# Patient Record
Sex: Female | Born: 1960 | Race: White | Hispanic: No | Smoking: Current every day smoker
Health system: Southern US, Community
[De-identification: ages and names within clinical notes are randomized; demographics above are authoritative.]

## PROBLEM LIST (undated history)

## (undated) DIAGNOSIS — Z91410 Personal history of adult physical and sexual abuse: Secondary | ICD-10-CM

## (undated) DIAGNOSIS — F329 Major depressive disorder, single episode, unspecified: Secondary | ICD-10-CM

## (undated) DIAGNOSIS — M797 Fibromyalgia: Secondary | ICD-10-CM

## (undated) DIAGNOSIS — J449 Chronic obstructive pulmonary disease, unspecified: Secondary | ICD-10-CM

## (undated) DIAGNOSIS — R1013 Epigastric pain: Secondary | ICD-10-CM

## (undated) DIAGNOSIS — M549 Dorsalgia, unspecified: Secondary | ICD-10-CM

## (undated) DIAGNOSIS — G8929 Other chronic pain: Secondary | ICD-10-CM

## (undated) DIAGNOSIS — J45909 Unspecified asthma, uncomplicated: Secondary | ICD-10-CM

## (undated) DIAGNOSIS — M5430 Sciatica, unspecified side: Secondary | ICD-10-CM

## (undated) DIAGNOSIS — K625 Hemorrhage of anus and rectum: Secondary | ICD-10-CM

## (undated) DIAGNOSIS — I10 Essential (primary) hypertension: Secondary | ICD-10-CM

## (undated) DIAGNOSIS — K7460 Unspecified cirrhosis of liver: Secondary | ICD-10-CM

## (undated) DIAGNOSIS — I2510 Atherosclerotic heart disease of native coronary artery without angina pectoris: Secondary | ICD-10-CM

## (undated) DIAGNOSIS — G4730 Sleep apnea, unspecified: Secondary | ICD-10-CM

## (undated) DIAGNOSIS — E119 Type 2 diabetes mellitus without complications: Secondary | ICD-10-CM

## (undated) DIAGNOSIS — F429 Obsessive-compulsive disorder, unspecified: Secondary | ICD-10-CM

## (undated) HISTORY — DX: Epigastric pain: R10.13

## (undated) HISTORY — DX: Personal history of adult physical and sexual abuse: Z91.410

## (undated) HISTORY — DX: Dorsalgia, unspecified: M54.9

## (undated) HISTORY — DX: Sciatica, unspecified side: M54.30

## (undated) HISTORY — DX: Major depressive disorder, single episode, unspecified: F32.9

## (undated) HISTORY — DX: Other chronic pain: G89.29

## (undated) HISTORY — PX: CORONARY ANGIOPLASTY: SHX604

## (undated) HISTORY — DX: Hemorrhage of anus and rectum: K62.5

## (undated) HISTORY — PX: ABDOMINAL HYSTERECTOMY: SHX81

## (undated) HISTORY — PX: CHOLECYSTECTOMY: SHX55

---

## 2001-03-23 ENCOUNTER — Encounter: Payer: Self-pay | Admitting: Neurosurgery

## 2001-03-23 ENCOUNTER — Encounter: Admission: RE | Admit: 2001-03-23 | Discharge: 2001-03-23 | Payer: Self-pay | Admitting: Neurosurgery

## 2001-04-06 ENCOUNTER — Encounter: Payer: Self-pay | Admitting: Neurosurgery

## 2001-04-06 ENCOUNTER — Encounter: Admission: RE | Admit: 2001-04-06 | Discharge: 2001-04-06 | Payer: Self-pay | Admitting: Neurosurgery

## 2001-04-18 ENCOUNTER — Encounter: Admission: RE | Admit: 2001-04-18 | Discharge: 2001-04-18 | Payer: Self-pay | Admitting: Neurosurgery

## 2004-05-26 ENCOUNTER — Encounter: Payer: Self-pay | Admitting: Cardiology

## 2004-05-27 ENCOUNTER — Ambulatory Visit: Payer: Self-pay | Admitting: Cardiology

## 2006-03-01 ENCOUNTER — Encounter: Admission: RE | Admit: 2006-03-01 | Discharge: 2006-03-01 | Payer: Self-pay | Admitting: Orthopedic Surgery

## 2007-07-24 ENCOUNTER — Ambulatory Visit (HOSPITAL_COMMUNITY): Admission: RE | Admit: 2007-07-24 | Discharge: 2007-07-24 | Payer: Self-pay | Admitting: Family Medicine

## 2007-08-24 ENCOUNTER — Ambulatory Visit: Payer: Self-pay | Admitting: Cardiology

## 2009-02-14 ENCOUNTER — Ambulatory Visit: Payer: Self-pay | Admitting: Cardiology

## 2009-02-14 ENCOUNTER — Encounter: Payer: Self-pay | Admitting: Cardiology

## 2009-03-27 ENCOUNTER — Ambulatory Visit: Payer: Self-pay | Admitting: Cardiology

## 2009-03-27 DIAGNOSIS — F17200 Nicotine dependence, unspecified, uncomplicated: Secondary | ICD-10-CM | POA: Insufficient documentation

## 2009-03-27 DIAGNOSIS — E785 Hyperlipidemia, unspecified: Secondary | ICD-10-CM

## 2009-03-27 DIAGNOSIS — I10 Essential (primary) hypertension: Secondary | ICD-10-CM

## 2009-03-27 DIAGNOSIS — R079 Chest pain, unspecified: Secondary | ICD-10-CM

## 2009-03-27 HISTORY — DX: Hyperlipidemia, unspecified: E78.5

## 2009-04-01 ENCOUNTER — Ambulatory Visit: Payer: Self-pay | Admitting: Cardiovascular Disease

## 2009-04-01 ENCOUNTER — Ambulatory Visit (HOSPITAL_COMMUNITY): Admission: RE | Admit: 2009-04-01 | Discharge: 2009-04-01 | Payer: Self-pay | Admitting: Cardiovascular Disease

## 2009-04-03 ENCOUNTER — Encounter: Payer: Self-pay | Admitting: Cardiology

## 2009-04-24 ENCOUNTER — Telehealth (INDEPENDENT_AMBULATORY_CARE_PROVIDER_SITE_OTHER): Payer: Self-pay | Admitting: *Deleted

## 2010-06-30 ENCOUNTER — Ambulatory Visit
Admission: RE | Admit: 2010-06-30 | Discharge: 2010-06-30 | Payer: Self-pay | Source: Home / Self Care | Attending: Internal Medicine | Admitting: Internal Medicine

## 2010-06-30 LAB — BASIC METABOLIC PANEL: Creatinine: 0.7 mg/dL (ref <=1.1)

## 2010-06-30 LAB — PROTIME-INR

## 2010-08-19 ENCOUNTER — Ambulatory Visit (INDEPENDENT_AMBULATORY_CARE_PROVIDER_SITE_OTHER): Payer: Medicaid Other | Admitting: Internal Medicine

## 2010-08-19 DIAGNOSIS — R1013 Epigastric pain: Secondary | ICD-10-CM

## 2010-08-19 HISTORY — DX: Epigastric pain: R10.13

## 2010-08-20 ENCOUNTER — Ambulatory Visit (HOSPITAL_COMMUNITY)
Admission: RE | Admit: 2010-08-20 | Discharge: 2010-08-20 | Disposition: A | Discharge status: Home / Self Care | Payer: Medicaid Other | Source: Ambulatory Visit | Attending: Internal Medicine | Admitting: Internal Medicine

## 2010-08-20 ENCOUNTER — Encounter (HOSPITAL_BASED_OUTPATIENT_CLINIC_OR_DEPARTMENT_OTHER): Payer: Medicaid Other | Admitting: Internal Medicine

## 2010-08-20 DIAGNOSIS — I10 Essential (primary) hypertension: Secondary | ICD-10-CM | POA: Insufficient documentation

## 2010-08-20 DIAGNOSIS — E785 Hyperlipidemia, unspecified: Secondary | ICD-10-CM | POA: Insufficient documentation

## 2010-08-20 DIAGNOSIS — K449 Diaphragmatic hernia without obstruction or gangrene: Secondary | ICD-10-CM

## 2010-08-20 DIAGNOSIS — E119 Type 2 diabetes mellitus without complications: Secondary | ICD-10-CM | POA: Insufficient documentation

## 2010-08-20 DIAGNOSIS — R112 Nausea with vomiting, unspecified: Secondary | ICD-10-CM

## 2010-08-20 DIAGNOSIS — Z7982 Long term (current) use of aspirin: Secondary | ICD-10-CM | POA: Insufficient documentation

## 2010-08-20 DIAGNOSIS — R1013 Epigastric pain: Secondary | ICD-10-CM | POA: Insufficient documentation

## 2010-08-20 DIAGNOSIS — Z79899 Other long term (current) drug therapy: Secondary | ICD-10-CM | POA: Insufficient documentation

## 2010-08-20 LAB — GLUCOSE, CAPILLARY

## 2010-08-24 ENCOUNTER — Other Ambulatory Visit (INDEPENDENT_AMBULATORY_CARE_PROVIDER_SITE_OTHER): Payer: Self-pay | Admitting: Internal Medicine

## 2010-08-24 DIAGNOSIS — R1110 Vomiting, unspecified: Secondary | ICD-10-CM

## 2010-08-24 DIAGNOSIS — R110 Nausea: Secondary | ICD-10-CM

## 2010-08-24 DIAGNOSIS — R1013 Epigastric pain: Secondary | ICD-10-CM

## 2010-08-27 ENCOUNTER — Other Ambulatory Visit (HOSPITAL_COMMUNITY): Payer: Medicaid Other

## 2010-09-02 ENCOUNTER — Ambulatory Visit (HOSPITAL_COMMUNITY)
Admission: RE | Admit: 2010-09-02 | Discharge: 2010-09-02 | Disposition: A | Discharge status: Home / Self Care | Payer: Medicaid Other | Source: Ambulatory Visit | Attending: Internal Medicine | Admitting: Internal Medicine

## 2010-09-02 DIAGNOSIS — K7689 Other specified diseases of liver: Secondary | ICD-10-CM | POA: Insufficient documentation

## 2010-09-02 DIAGNOSIS — R1110 Vomiting, unspecified: Secondary | ICD-10-CM

## 2010-09-02 DIAGNOSIS — R112 Nausea with vomiting, unspecified: Secondary | ICD-10-CM | POA: Insufficient documentation

## 2010-09-02 DIAGNOSIS — R1013 Epigastric pain: Secondary | ICD-10-CM

## 2010-09-02 DIAGNOSIS — R110 Nausea: Secondary | ICD-10-CM

## 2010-09-13 NOTE — Consult Note (Signed)
NAME:  Amber Fischer                 ACCOUNT NO.:  [id number]  MEDICAL RECORD NO.:  [id number]           PATIENT TYPE: AMB  LOCATION:  [facility]                   FACILITY: [facility]  PHYSICIAN:  Amber Fischer, M.D.    DATE OF BIRTH:  Mar 04, 1961  DATE OF CONSULTATION: DATE OF DISCHARGE:                                CONSULTATION   REASON FOR CONSULTATION:  Epigastric pain.  HISTORY OF PRESENT ILLNESS:  Amber Fischer is a 50 year old female, patient of Dr. Isac [place] in [place], [place].  Today, she presents with complaints of epigastric pain which she has had over a year.  She states the Nexium is not helping at this time.  She was previously taking Carafate, but has since stopped that.  Her acid reflux has been worse in the last month.  She says her appetite is okay.  She says all foods are hurting her when she eats.  She does complain of epigastric tenderness. She was last seen in January of this year for acid reflux and elevated LFTs.  She was complaining of epigastric pain, which occurred after eating and when she lies down at night, the Nexium was helping at that time.  She had also been seen for elevated LFTs.  She had been off simvastatin for approximately 6 months, but her liver enzymes had been elevated.  In [place], she underwent an ultrasound of the abdomen, which showed echogenic liver suggesting a fatty infiltration.  Her labs in [place], her SGOT was 74 and her SGPT was 88.  Her total bilirubin was 0.8.  In January of this year, her ALP was 75, SGOT was 67 and her ALT was 92.  Her iron was 115, her TIBC was 281.  Her sed rate was 6. Her PT 13.5.  INR was 1.01.  Her ferritin was 202.  Her weight in January was 285.  She does have an increase in her weight, but she has quit smoking.  She has had a 6-pound weight gain since January.  She was advised last month to try to lose weight, but since she has quit smoking she did have a small amount of weight gain.  She was  advised last visit to increase her Carafate and to take her Nexium twice a day, but this has not helped.  ALLERGIES:  She is allergic to SULFA AND BEES.  HOME MEDICATIONS:  Include 1. Ventolin inhaler b.i.d. 2. Carafate twice a day. 3. Neurontin 300 mg one a day. 4. Nexium 40 mg one a day. 5. Aspirin 81 mg a day. 6. Losartan 50 mg a day. 7. Metoprolol 100 mg one and a half tablets a day. 8. Metformin 500 mg twice a day. 9. Hydrochlorothiazide  25 mg one half tablet a day. 10.Zyrtec 10 mg a day. 11.Cymbalta 60 mg a day. 12.Vistaril 25 mg a day. 13.Levothyroxine 75 mcg a day. 14.Wellbutrin 150 mg b.i.d. 15.Flexeril 10 mg b.i.d. 16.Fish oil 1200 mg twice a day.  OBJECTIVE:  VITAL SIGNS:  Her present weight is 291, her height is 5 feet 9.5 inches, her temperature is 98, her blood pressure is 126/90 and her pulse is 72. HEENT:  Her conjunctivae is pink.  Her sclerae anicteric. NECK:  Her thyroid is normal.  There is no cervical lymphadenopathy. LUNGS:  Clear. HEART:  Regular rate and rhythm. ABDOMEN:  Obese.  Bowel sounds are positive.  She does have tenderness to the epigastric region.  ASSESSMENT:  Amber Fischer is a 50 year old female with epigastric pain and elevated LFTs.  At this point peptic ulcer disease needs to be ruled out.  She continues to have this pain even with double dosing on a PPI. She is also had been asked today to continue her Carafate.  RECOMMENDATIONS:  We will schedule an EGD with Dr. Karilyn Cota in the near future.  The risks and benefits were reviewed with her, as far as her LFTs we will go ahead and get a hepatitis B surface antigen, hepatitis C antibody.  Also, get a H. pylori and another hepatic function on her and further recommendations once we have these tests back.    ______________________________ Amber Ar, NP   ______________________________ Amber [place], M.D.    TS/MEDQ  D:  08/19/2010  T:  08/20/2010  Job:  [id number]  cc:   Ms.  Amber Fischer, Bryce  Electronically Signed by Amber Ar PA on 08/21/2010 08:18:09 AM Electronically Signed by Amber Fischer M.D. on 09/13/2010 12:53:06 PM

## 2010-09-13 NOTE — Op Note (Signed)
  NAME:  CULLEN, VANALLEN                 ACCOUNT NO.:  [id number]  MEDICAL RECORD NO.:  [id number]           PATIENT TYPE:  O  LOCATION:  [facility]                          FACILITY:  [facility]  PHYSICIAN:  Lionel December, M.D.    DATE OF BIRTH:  January 06, 1961  DATE OF PROCEDURE:  08/20/2010 DATE OF DISCHARGE:                              OPERATIVE REPORT   PROCEDURE:  Esophagogastroduodenoscopy.  INDICATION:  Vandella is 50 year old Caucasian female with multiple medical problems including diabetes mellitus who has a recurrent pain in epigastric region and she also experiences intermittent nausea and vomiting which she attributes to when she has flare-ups with her GERD. She is status post lap cole about 10 years ago for symptomatic cholelithiasis.  She also has elevated transaminases, felt to be secondary to fatty liver.  She is undergoing diagnostic EGD.  Procedures were reviewed with the patient.  Informed consent was obtained.  MEDS FOR CONSCIOUS SEDATION:  Cetacaine spray for oropharyngeal topical anesthesia, Demerol 50 mg IV, Versed 6 mg IV.  FINDINGS:  Procedure performed in endoscopy suite.  The patient's vital signs and O2 sat were monitored during the procedure and remained stable.  The patient was placed in left lateral recumbent position and Pentax videoscope was passed through oropharynx without any difficulty into esophagus.  Esophagus.  Mucosa of the esophagus was normal.  Gastroesophageal junction was serrated or wavy, but no erosions or ulcers were noted.  GE junction was located at 38 cm and hiatus was at 40.  Hiatus was wide open.  Stomach.  It was empty and distended very well by insufflation.  Folds of proximal stomach are normal.  Examination of mucosa and body, antrum, pyloric channel, as well as angularis, fundus, and cardia was normal.  Duodenum.  Bulbar mucosa was normal.  Scope was passed into second part of duodenum where mucosa and folds were normal.  Endoscope  was withdrawn.  The patient tolerated the procedure well.  FINAL DIAGNOSIS:  Small sliding hiatal hernia, otherwise normal esophagogastroduodenoscopy.  RECOMMENDATIONS: 1. Antireflux measures were reinforced.  The patient advised to take     Nexium 40 mg 30 minutes before breakfast. 2. We will schedule her for upper abdominal ultrasound to examine her     liver and bile duct.     Lionel December, M.D.     NR/MEDQ  D:  08/20/2010  T:  08/20/2010  Job:  [id number]  cc:   Dr. Rush Barer,   Electronically Signed by Lionel December M.D. on 09/13/2010 12:53:18 PM

## 2010-09-14 LAB — HEPATIC FUNCTION PANEL: Bilirubin, Total: 0.8 mg/dL

## 2010-09-21 ENCOUNTER — Ambulatory Visit (INDEPENDENT_AMBULATORY_CARE_PROVIDER_SITE_OTHER): Payer: Medicaid Other | Admitting: Internal Medicine

## 2010-09-21 DIAGNOSIS — K219 Gastro-esophageal reflux disease without esophagitis: Secondary | ICD-10-CM

## 2010-09-21 DIAGNOSIS — R1011 Right upper quadrant pain: Secondary | ICD-10-CM

## 2010-09-24 LAB — CBC
MCV: 91.5 fL (ref 78.0–100.0)
RBC: 4.97 MIL/uL (ref 3.87–5.11)
WBC: 7.2 10*3/uL (ref 4.0–10.5)

## 2010-09-24 LAB — PROTIME-INR
INR: 0.98 (ref 0.00–1.49)
Prothrombin Time: 12.9 seconds (ref 11.6–15.2)

## 2010-09-24 LAB — BASIC METABOLIC PANEL
Chloride: 101 mEq/L (ref 96–112)
Creatinine, Ser: 0.84 mg/dL (ref 0.4–1.2)
GFR calc Af Amer: >60 mL/min (ref >60)

## 2010-10-05 NOTE — Consult Note (Signed)
NAME:  Amber [facility]                 ACCOUNT NO.:  [id number]  MEDICAL RECORD NO.:  [id number]           PATIENT TYPE:  LOCATION:                                 FACILITY:  PHYSICIAN:  Lionel December, M.D.    DATE OF BIRTH:  1960/12/18  DATE OF CONSULTATION:  09/21/2010 DATE OF DISCHARGE:                                CONSULTATION   PRESENTING COMPLAINT:  Recurrent heartburn worse at night, intermittent right upper quadrant pain.  SUBJECTIVE:  Amber Fischer is a 50 year old Caucasian female, patient of Dr. Isac Sarna, who is here for scheduled visit.  She states she is not feeling well.  She complains of frequent heartburn.  She says it is worse at night and she cannot rest.  She even has burning in her throat. She doubled up on her Nexium and she felt a lot better but apparently she was told it will not be covered by her insurance.  She has been taking Pepcid 40 mg in the evening, but this does not help.  When she has these symptoms, she becomes nauseated and vomits 3-4 times a week. She states it is usually clear fluid bile or at times food.  She continues to complain of intermittent right upper quadrant pain after meals.  She denies fever or chills.  She had LFTs as planned which is below.  She denies melena or rectal bleeding.  CURRENT MEDICATIONS: 1. Ventolin inhaler 2 puffs t.i.d. p.r.n. 2. Carafate 1 g b.i.d. 3. Gabapentin 300 mg daily. 4. Nexium 40 mg p.o. q.a.m. 5. Pepcid 40 mg p.o. at bedtime. 6. ASA 81 mg daily. 7. Losartan 50 mg daily. 8. Metoprolol 150 mg daily. 9. Metformin 500 mg p.o. b.i.d. 10.HCTZ 12.5 mg daily. 11.Zyrtec 10 mg daily. 12.Cymbalta 60 mg daily. 13.Vistaril 25 mg daily. 14.Levothyroxine 75 mcg daily. 15.Xanax 1 mg t.i.d. p.r.n. 16.Wellbutrin 150 mg p.o. daily. 17.Flexeril 10 mg b.i.d. 18.Fish oil 1.2 g b.i.d. 19.Ultram 100 mg daily p.r.n.  OBJECTIVE:  VITAL SIGNS:  Weight 291 pounds.  She is 69 inches tall. Pulse 80 per minute, blood  pressure 128/88, and temperature is 97.4. HEENT:  She has xanthelasma over her upper and lower eyelids. Conjunctivae are pink.  Sclerae are nonicteric.  Oropharyngeal mucosa is normal. NECK:  No neck masses are noted. ABDOMEN:  Obese.  Bowel sounds are normal.  She has mild generalized tenderness.  No organomegaly or masses noted. EXTREMITIES:  She does not have peripheral edema or clubbing.  LABORATORY DATA:  From September 14, 2010, bilirubin is 0.8, AP 81, SGOT 75, SGPT 105, total protein 7.9 with albumin of 4.6, serum amylase 22, lipase 11.  LFTs on August 20, 2010; SGOT was 83, SGPT 96 and in January, SGOT was 67 and SGPT 92.  She had viral markers on August 19, 2010.  Hepatitis B surface antigen negative.  Hepatitis C virus antibody negative.  Her H. pylori serology was negative.  Serum ferritin was 202.  ASSESSMENT:  Meika has multiple gastrointestinal issues. 1. Chronic gastroesophageal reflux disease.  Her symptoms are not well     controlled with present therapy.  She  had     esophagogastroduodenoscopy on August 20, 2010, which was unremarkable     other than small sliding hiatal hernia.  I am afraid she needs to     be on double dose Nexium.  She has tried other PPIs and apparently     they have not worked.  If she is successful in losing weight, it     would help her gastroesophageal reflux disease symptoms as well as     her fatty liver. 2. Elevated transaminases.  Studies were negative for B and C     hepatitis and serum ferritin is normal.  Ultrasound showed fatty     liver.  Trend is stable. 3. Intermittent right upper quadrant pain.  I am not convinced that     this is due to sphincter of Oddi dysfunction.  We would repeat her     LFTs following an episode of significant pain.  RECOMMENDATIONS: 1. The patient must adhere to diet in order to lose weight and she     needs to gradually increase her physical activity. 2. She will go lab for LFTs should she experience  another episode of     significant pain in her right upper quadrant. 3. Discontinue Pepcid. 4. New prescription written for Nexium 40 mg p.o. b.i.d., 60 with 5     refills. 5. She can discontinue Carafate.  Unless she has problems, we will see her back in 3 months.     Lionel December, M.D.     NR/MEDQ  D:  09/22/2010  T:  09/22/2010  Job:  [id number]  cc:   Isac Sarna, MD  Electronically Signed by Lionel December M.D. on 10/04/2010 11:06:47 PM

## 2010-11-02 ENCOUNTER — Ambulatory Visit (INDEPENDENT_AMBULATORY_CARE_PROVIDER_SITE_OTHER): Payer: Medicaid Other | Admitting: Internal Medicine

## 2010-11-02 DIAGNOSIS — K625 Hemorrhage of anus and rectum: Secondary | ICD-10-CM

## 2010-11-02 HISTORY — DX: Hemorrhage of anus and rectum: K62.5

## 2010-11-08 NOTE — Consult Note (Signed)
  NAME:  Amber Fischer, Amber Fischer                 ACCOUNT NO.:  [id number]  MEDICAL RECORD NO.:  [id number]           PATIENT TYPE:  LOCATION:                                 FACILITY:  PHYSICIAN:  Lionel December, M.D.    DATE OF BIRTH:  [birth year]  DATE OF CONSULTATION: DATE OF DISCHARGE:                                CONSULTATION   ADDENDUM:  VITAL SIGNS:  Weight 291.5, blood pressure 104/82, her temperature was 97.1, pulse 76.    ______________________________ Dorene Ar, NP   ______________________________ Lionel December, M.D.    TS/MEDQ  D:  11/02/2010  T:  11/02/2010  Job:  [id number]  Electronically Signed by Dorene Ar NP on 11/05/2010 98:11:91 PM Electronically Signed by Lionel December M.D. on 11/08/2010 07:23:19 PM

## 2010-11-08 NOTE — Consult Note (Signed)
NAMESHARIAH, ASSAD                 ACCOUNT NO.:  [id number]  MEDICAL RECORD NO.:  [id number]           PATIENT TYPE:  O  LOCATION:                        FACILITY:  [facility]  PHYSICIAN:  Lionel December, M.D.    DATE OF BIRTH:  06-Jun-1961  DATE OF CONSULTATION:  11/02/2010 DATE OF DISCHARGE:  09/02/2010                                CONSULTATION   REASON FOR CONSULTATION:  Rectal bleeding.  HISTORY OF PRESENT ILLNESS:  Ms. Shultis is a 50 year old female referred to our office by Dr. Isac Sarna in [place] for rectal bleeding.  Malie states she has had symptoms which started on Oct 20, 2010.  Her rectal bleeding lasted for approximately 2 weeks.  She says the bleeding was heavy.  The bleeding turned the water in the commode red.  She says she does have a history of rectal bleeding in the past, but has never followed up with her family physician for this.  She has had no rectal bleeding in the last 2 days.  There was lower abdominal cramps associated with her rectal bleeding.  She denies any weight loss.  Her appetite has been good.  She usually has 1-2 bowel movements a day and they are brown in color.  She has never undergone a colonoscopy in the past.  Her appetite is good.  There is no fatigue.  No dysphagia.  No constipation.  No history of melena.  HOME MEDICATIONS: 1. Ventolin inhaler once a day. 2. Carafate 1 g twice a day. 3. Gabapentin 300 mg 1 a day. 4. Nexium 40 mg twice a day. 5. Aspirin 81 mg a day. 6. Losartan 50 mg a day. 7. Metoprolol 100 mg 1-1/2 tablets daily. 8. Metformin 500 mg 2 twice a day. 9. Hydrochlorothiazide 25 mg 1/2 tablet a day. 10.Zyrtec 10 mg a day. 11.Cymbalta 60 mg a day. 12.Vistaril 25 mg a day. 13.Levothyroxine 75 mcg a day. 14.Xanax 1-3 a day. 15.Wellbutrin 150 mg twice a day. 16.Flexeril 10 mg twice a day. 17.Fish oil 1200 mg 1 a day. 18.Ultram 50 mg as needed.  She is allergic to SULFA and BEES.  SURGERIES:  She has had a total  hysterectomy, cardiac cath which she reports is normal.  She has had a cholecystectomy.  MEDICAL PROBLEMS: 1. She has been a diabetic for year. 2. Hypertension. 3. Depression. 4. Hypothyroidism. 5. High cholesterol. 6. Fibromyalgia. 7. Acid reflux. 8. Migraines. 9. COPD. 10.Panic attacks.  FAMILY HISTORY:  Her mother is alive with CAD, hypertension, and a cardiac stent.  Her mother has had a CVA and high cholesterol.  Her father is alive with type 2 diabetes, hypertension, and high cholesterol . one sister with a history of migraines.  SOCIAL HISTORY:  She is divorced.  She is unemployed.  She smokes half a pack a day x15 years.  She does not drink or do drugs.  She has three children, one child has RA and fibromyalgia and two are in good health.  OBJECTIVE:  HEENT:  Her conjunctivae is pink.  Her sclerae are anicteric. NECK:  Her thyroid is normal.  There is no cervical lymphadenopathy.  LUNGS:  Clear. HEART:  Regular rate and rhythm. ABDOMEN:  Soft.  Bowel sounds are positive.  No masses felt.  Obese. Her stool was brown and guaiac negative.  ASSESSMENT:  Ms. Russon is a 50 year old female presenting today with complaints of rectal bleeding which is now resolved.  She is 50 years old.  It will be reasonable to proceed with a colonoscopy to rule out a colonic neoplasm or possible hemorrhoid or AVM.  RECOMMENDATIONS:  We will schedule a colonoscopy with Dr. Karilyn Cota and the risks and benefits were reviewed with the patient and she is agreeable.    ______________________________ Dorene Ar, NP   ______________________________ Lionel December, M.D.    TS/MEDQ  D:  11/02/2010  T:  11/02/2010  Job:  [id number]  Electronically Signed by Dorene Ar PA on 11/05/2010 04:10:52 PM Electronically Signed by Lionel December M.D. on 11/08/2010 07:23:37 PM

## 2010-11-30 HISTORY — PX: COLONOSCOPY: SHX174

## 2010-12-24 ENCOUNTER — Other Ambulatory Visit (INDEPENDENT_AMBULATORY_CARE_PROVIDER_SITE_OTHER): Payer: Self-pay | Admitting: Internal Medicine

## 2010-12-24 ENCOUNTER — Encounter (HOSPITAL_BASED_OUTPATIENT_CLINIC_OR_DEPARTMENT_OTHER): Payer: Medicaid Other | Admitting: Internal Medicine

## 2010-12-24 ENCOUNTER — Ambulatory Visit (HOSPITAL_COMMUNITY)
Admission: RE | Admit: 2010-12-24 | Discharge: 2010-12-24 | Disposition: A | Discharge status: Home / Self Care | Payer: Medicaid Other | Source: Ambulatory Visit | Attending: Internal Medicine | Admitting: Internal Medicine

## 2010-12-24 DIAGNOSIS — K625 Hemorrhage of anus and rectum: Secondary | ICD-10-CM

## 2010-12-24 DIAGNOSIS — D126 Benign neoplasm of colon, unspecified: Secondary | ICD-10-CM | POA: Insufficient documentation

## 2010-12-24 DIAGNOSIS — K644 Residual hemorrhoidal skin tags: Secondary | ICD-10-CM

## 2010-12-24 DIAGNOSIS — I10 Essential (primary) hypertension: Secondary | ICD-10-CM | POA: Insufficient documentation

## 2010-12-24 DIAGNOSIS — E119 Type 2 diabetes mellitus without complications: Secondary | ICD-10-CM | POA: Insufficient documentation

## 2010-12-24 DIAGNOSIS — Z7982 Long term (current) use of aspirin: Secondary | ICD-10-CM | POA: Insufficient documentation

## 2010-12-24 DIAGNOSIS — Z79899 Other long term (current) drug therapy: Secondary | ICD-10-CM | POA: Insufficient documentation

## 2010-12-24 HISTORY — PX: COLONOSCOPY W/ BIOPSIES: SHX1374

## 2010-12-24 LAB — GLUCOSE, CAPILLARY: Glucose-Capillary: 120 mg/dL — ABNORMAL HIGH (ref 70–99)

## 2011-01-11 NOTE — Op Note (Signed)
  NAME:  [name], [name]                 ACCOUNT NO.:  [id number]  MEDICAL RECORD NO.:  [id number]  LOCATION:  [facility]                          FACILITY:  [facility]  PHYSICIAN:  Lionel December, M.D.    DATE OF BIRTH:  [birth year]  DATE OF PROCEDURE:  12/24/2010 DATE OF DISCHARGE:                              OPERATIVE REPORT   PROCEDURE:  Colonoscopy.  INDICATIONS:  Pheobe is a 50 year old Caucasian female with intermittent rectal bleeding.  At times, she passes blood when she urinates.  She denies constipation or diarrhea.  Procedure and risks were reviewed with the patient.  Informed consent was obtained.  MEDICATIONS FOR CONSCIOUS SEDATION:  Demerol 50 mg IV, Versed 8 mg IV.  FINDINGS:  Procedure performed in endoscopy suite.  The patient's vital signs and O2 saturations were monitored during the procedure and remained stable.  The patient was placed in left lateral recumbent position.  Rectal examination was performed.  No abnormality noted on external or digital exam.  Pentax videoscope was placed through rectum and advanced under vision into sigmoid colon and beyond.  Preparation was excellent.  Scope was passed into cecum which was identified by appendiceal orifice and ileocecal valve.  Pictures were taken for the record.  As the scope was withdrawn, colonic mucosa was carefully examined.  There was a 4-mm polyp at hepatic flexure which was ablated via cold biopsy.  There was another small polyp at sigmoid colon which was ablated via cold biopsy.  Mucosa of rest of the colon was normal. Rectal mucosa similarly was normal.  Scope was retroflexed to examine anorectal junction and small hemorrhoids noted below the dentate line. Endoscope was withdrawn.  Withdrawal time was over 10 minutes.  The patient tolerated the procedure well.  FINAL DIAGNOSES: 1. Examination performed to cecum. 2. Two small polyps ablated via cold biopsy (hepatic flexure and     sigmoid colon). 3. External  hemorrhoids felt to be the source of the patient's rectal     bleeding.  RECOMMENDATIONS: 1. Standard instructions given. 2. High-fiber diet. 3. I will be contacting the patient results of biopsy and further     recommendations.          ______________________________ Lionel December, M.D.     NR/MEDQ  D:  12/24/2010  T:  12/24/2010  Job:  [id number]  Electronically Signed by Lionel December M.D. on 01/11/2011 12:10:17 AM

## 2011-01-12 ENCOUNTER — Encounter (INDEPENDENT_AMBULATORY_CARE_PROVIDER_SITE_OTHER): Payer: Self-pay

## 2011-01-21 ENCOUNTER — Telehealth (INDEPENDENT_AMBULATORY_CARE_PROVIDER_SITE_OTHER): Payer: Self-pay | Admitting: *Deleted

## 2011-01-21 NOTE — Telephone Encounter (Signed)
Lab order faxed and letter sent to the patient..  

## 2011-02-01 ENCOUNTER — Ambulatory Visit (INDEPENDENT_AMBULATORY_CARE_PROVIDER_SITE_OTHER): Payer: Medicaid Other | Admitting: Internal Medicine

## 2011-03-03 ENCOUNTER — Ambulatory Visit (INDEPENDENT_AMBULATORY_CARE_PROVIDER_SITE_OTHER): Payer: Medicaid Other | Admitting: Internal Medicine

## 2011-09-27 ENCOUNTER — Other Ambulatory Visit (INDEPENDENT_AMBULATORY_CARE_PROVIDER_SITE_OTHER): Payer: Self-pay | Admitting: Internal Medicine

## 2011-09-27 DIAGNOSIS — K219 Gastro-esophageal reflux disease without esophagitis: Secondary | ICD-10-CM

## 2011-09-28 ENCOUNTER — Telehealth (INDEPENDENT_AMBULATORY_CARE_PROVIDER_SITE_OTHER): Payer: Self-pay | Admitting: *Deleted

## 2011-09-28 NOTE — Telephone Encounter (Signed)
[facility] in [place] has requested a refill on Nexium 40 mg, take one capsule by mouth twice daily.

## 2011-09-29 MED ORDER — ESOMEPRAZOLE MAGNESIUM 40 MG PO CPDR: 40.0000 mg | DELAYED_RELEASE_CAPSULE | Freq: Every day | ORAL | Status: DC

## 2011-11-30 DIAGNOSIS — R079 Chest pain, unspecified: Secondary | ICD-10-CM

## 2012-02-24 ENCOUNTER — Ambulatory Visit (INDEPENDENT_AMBULATORY_CARE_PROVIDER_SITE_OTHER): Payer: Medicaid Other | Admitting: Internal Medicine

## 2012-02-24 ENCOUNTER — Encounter (INDEPENDENT_AMBULATORY_CARE_PROVIDER_SITE_OTHER): Payer: Self-pay | Admitting: Internal Medicine

## 2012-02-24 VITALS — BP 124/86 | HR 80 | Temp 97.5°F | Ht 69.5 in | Wt 271.1 lb

## 2012-02-24 DIAGNOSIS — R748 Abnormal levels of other serum enzymes: Secondary | ICD-10-CM

## 2012-02-24 DIAGNOSIS — R112 Nausea with vomiting, unspecified: Secondary | ICD-10-CM

## 2012-02-24 LAB — HEPATIC FUNCTION PANEL
Alkaline Phosphatase: 108 U/L (ref 39–117)
Bilirubin, Direct: 0.2 mg/dL (ref 0.0–0.3)
Indirect Bilirubin: 1.2 mg/dL — ABNORMAL HIGH (ref 0.0–0.9)
Total Bilirubin: 1.4 mg/dL — ABNORMAL HIGH (ref 0.3–1.2)

## 2012-02-24 MED ORDER — METOCLOPRAMIDE HCL 5 MG PO TABS: 5.0000 mg | ORAL_TABLET | Freq: Two times a day (BID) | ORAL | Status: DC

## 2012-02-24 NOTE — Progress Notes (Addendum)
Subjective:     Patient ID: Amber [place], female   DOB: [birth year], 51 y.o.   MRN: [id number]  HPI  Here today for f/u.  She tells me she has nausea when she eats.  She tells me her pills feel like they are lodging.  Sometimes foods feel like they are lodging. She is having nausea off and on for the past couple of weeks. Nausea will last sometimes 3-5 days. She has nausea at least once a month. She tells me she went to the ED Sunday for nausea and headache. She tells me she had chest pain last night which lasted 6-8 hrs. Appetite is good. No weight loss.  She does c/o mid abdominal pain with vomiting occasionally. She usually has a BM about 2-3 times a day. She did have diarrhea yesterday which lasted 30 minutes. She usually eats one meal a day.  Hx of elevated transaminases  08/24/2010: Abdominal US for epigastric pain, nausea and vomiting: IMPRESSION:  Fatty enlarged liver. No acute findings.   08/20/2010- Hepatitis B Surface Antigen Negative, Hepatitis C Antibody Negative 07/02/2010: Iron 115, UIBC 281, TIBC 396, %Sat 29, PT/INR 13.5/1.01, ferritin 202.   Cholecystecomy in the 1990s for gallstones.  Records from [facility] 02/20/2012: ALP 101, AST 119, total bili 1.1,  ALT 171 EGD 08/20/2010 INDICATION: Amber Fischer is 51 year old Caucasian female with multiple medical  problems including diabetes mellitus who has a recurrent pain in  epigastric region and she also experiences intermittent nausea and  vomiting which she attributes to when she has flare-ups with her GERD.  FINAL DIAGNOSIS: Small sliding hiatal hernia, otherwise normal  esophagogastroduodenoscopy.  Review of Systems see hpi Current Outpatient Prescriptions  Medication Sig Dispense Refill  . albuterol (PROVENTIL HFA;VENTOLIN HFA) 108 (90 BASE) MCG/ACT inhaler Inhale 2 puffs into the lungs every 6 (six) hours as needed.      . ALPRAZolam (XANAX) 0.25 MG tablet Take 0.25 mg by mouth.        Marland Kitchen aspirin 81 MG tablet Take 81 mg by  mouth daily.        . cetirizine (ZYRTEC) 10 MG chewable tablet Chew 10 mg by mouth daily.        . cyclobenzaprine (FLEXERIL) 10 MG tablet Take 10 mg by mouth 3 (three) times daily as needed.        . DULoxetine (CYMBALTA) 60 MG capsule Take 60 mg by mouth daily.        Marland Kitchen gabapentin (NEURONTIN) 300 MG capsule Take 300 mg by mouth 3 (three) times daily.        . hydrochlorothiazide 25 MG tablet Take 25 mg by mouth daily.        . hydrOXYzine (VISTARIL) 25 MG capsule Take 25 mg by mouth 3 (three) times daily as needed.        Marland Kitchen levothyroxine (SYNTHROID, LEVOTHROID) 75 MCG tablet Take 75 mcg by mouth daily.        Marland Kitchen lidocaine (LIDODERM) 5 % Place 1 patch onto the skin daily. Remove & Discard patch within 12 hours or as directed by MD      . losartan (COZAAR) 50 MG tablet Take 50 mg by mouth daily.        . metformin (FORTAMET) 500 MG (OSM) 24 hr tablet Take 1,000 mg by mouth.       . metoprolol (TOPROL-XL) 100 MG 24 hr tablet Take 100 mg by mouth daily.        Marland Kitchen NEXIUM 40 MG capsule TAKE  ONE CAPSULE BY MOUTH TWICE DAILY  30 each  7  . traMADol (ULTRAM) 50 MG tablet Take 50 mg by mouth every 6 (six) hours as needed.        Marland Kitchen esomeprazole (NEXIUM) 40 MG capsule Take 1 capsule (40 mg total) by mouth daily.  30 capsule  1   Past Medical History  Diagnosis Date  . Rectal bleed 11/02/2010  . Epigastric abdominal pain 08/19/2010  . Sciatica   . Back pain, chronic    History   Social History  . Marital Status: Divorced    Spouse Name: N/A    Number of Children: N/A  . Years of Education: N/A   Occupational History  . Not on file.   Social History Main Topics  . Smoking status: Former Smoker -- 0.5 packs/day for 15 years    Types: Cigarettes  . Smokeless tobacco: Not on file   Comment: Quit 1 yr ago after smoking 11 yrs  . Alcohol Use: Yes     rare  . Drug Use: No  . Sexually Active: Not on file   Other Topics Concern  . Not on file   Social History Narrative  . No narrative on  file   Family Status  Relation Status Death Age  . Mother Alive     good health  . Father Alive     good health  . Sister Alive     good health   Allergies  Allergen Reactions  . Sulfonamide Derivatives     REACTION: hives   .      Objective:   Physical Exam  Filed Vitals:   02/24/12 1051  BP: 124/86  Pulse: 80  Temp: 97.5 F (36.4 C)   Filed Vitals:   02/24/12 1051  Height: 5' 9.5" (1.765 m)  Weight: 271 lb 1.6 oz (122.97 kg)    Alert and oriented. Skin warm and dry. Oral mucosa is moist.   . Sclera anicteric, conjunctivae is pink. Thyroid not enlarged. No cervical lymphadenopathy. Lungs clear. Heart regular rate and rhythm.  Abdomen is soft. Bowel sounds are positive. No hepatomegaly. No abdominal masses felt. No tenderness.  No edema to lower extremities. Patient is alert and oriented.      Assessment:    Nausea, chronic. Chest pain.   Probably PUD.  Possible slow empyting stomach given hx of nausea and vomiting after eating. Basically normal EGD last year. Elevated transaminases on 02/20/2012 at [facility]. Hx of fatty liver. CBD stone needs to be ruled out.    Plan:    Gastric emptying study. Further recommendations to follow. Will repeat hepatic function study and get an abdominal US to be sure her duct is not dilated.

## 2012-02-24 NOTE — Patient Instructions (Addendum)
Gastric emptying study. Further recommendations to follow. May have slow emptying study.j Eat small meals x 4 a day.

## 2012-02-25 ENCOUNTER — Encounter (INDEPENDENT_AMBULATORY_CARE_PROVIDER_SITE_OTHER): Payer: Self-pay

## 2012-02-28 ENCOUNTER — Encounter (HOSPITAL_COMMUNITY): Payer: Self-pay

## 2012-02-28 ENCOUNTER — Encounter (HOSPITAL_COMMUNITY)
Admission: RE | Admit: 2012-02-28 | Discharge: 2012-02-28 | Disposition: A | Discharge status: Home / Self Care | Payer: Medicaid Other | Source: Ambulatory Visit | Attending: Internal Medicine | Admitting: Internal Medicine

## 2012-02-28 DIAGNOSIS — K3189 Other diseases of stomach and duodenum: Secondary | ICD-10-CM | POA: Insufficient documentation

## 2012-02-28 DIAGNOSIS — R933 Abnormal findings on diagnostic imaging of other parts of digestive tract: Secondary | ICD-10-CM | POA: Insufficient documentation

## 2012-02-28 DIAGNOSIS — R112 Nausea with vomiting, unspecified: Secondary | ICD-10-CM | POA: Insufficient documentation

## 2012-02-28 HISTORY — DX: Unspecified asthma, uncomplicated: J45.909

## 2012-02-28 MED ORDER — TECHNETIUM TC 99M SULFUR COLLOID
2.0000 | Freq: Once | INTRAVENOUS | Status: AC | PRN
  Administered 2012-02-28: 2 via ORAL

## 2012-03-02 ENCOUNTER — Ambulatory Visit (HOSPITAL_COMMUNITY)
Admission: RE | Admit: 2012-03-02 | Discharge: 2012-03-02 | Disposition: A | Discharge status: Home / Self Care | Payer: Medicaid Other | Source: Ambulatory Visit | Attending: Internal Medicine | Admitting: Internal Medicine

## 2012-03-02 DIAGNOSIS — K7689 Other specified diseases of liver: Secondary | ICD-10-CM | POA: Insufficient documentation

## 2012-03-02 DIAGNOSIS — R109 Unspecified abdominal pain: Secondary | ICD-10-CM | POA: Insufficient documentation

## 2012-03-02 DIAGNOSIS — R748 Abnormal levels of other serum enzymes: Secondary | ICD-10-CM | POA: Insufficient documentation

## 2012-03-02 DIAGNOSIS — R112 Nausea with vomiting, unspecified: Secondary | ICD-10-CM

## 2012-03-03 ENCOUNTER — Encounter (INDEPENDENT_AMBULATORY_CARE_PROVIDER_SITE_OTHER): Payer: Self-pay | Admitting: *Deleted

## 2012-03-03 ENCOUNTER — Other Ambulatory Visit (INDEPENDENT_AMBULATORY_CARE_PROVIDER_SITE_OTHER): Payer: Self-pay | Admitting: *Deleted

## 2012-03-03 DIAGNOSIS — R933 Abnormal findings on diagnostic imaging of other parts of digestive tract: Secondary | ICD-10-CM

## 2012-03-06 ENCOUNTER — Telehealth (INDEPENDENT_AMBULATORY_CARE_PROVIDER_SITE_OTHER): Payer: Self-pay | Admitting: Internal Medicine

## 2012-03-06 NOTE — Telephone Encounter (Signed)
Results given to patient. I discussed with Dr. Karilyn Cota.     Tammy needs repeat Hepatic profile in 2 weeks.

## 2012-03-08 ENCOUNTER — Telehealth (INDEPENDENT_AMBULATORY_CARE_PROVIDER_SITE_OTHER): Payer: Self-pay | Admitting: *Deleted

## 2012-03-08 NOTE — Telephone Encounter (Signed)
Per Terri the patient will need to have Hepatic Profile in 2 weeks

## 2012-03-08 NOTE — Telephone Encounter (Signed)
Lab has been faxed to [facility] 

## 2012-03-17 LAB — HEPATIC FUNCTION PANEL
Alkaline Phosphatase: 112 U/L (ref 39–117)
Indirect Bilirubin: 0.7 mg/dL (ref 0.0–0.9)
Total Bilirubin: 0.9 mg/dL (ref 0.3–1.2)
Total Protein: 7.2 g/dL (ref 6.0–8.3)

## 2012-03-20 ENCOUNTER — Encounter (HOSPITAL_COMMUNITY): Payer: Self-pay | Admitting: Pharmacy Technician

## 2012-03-21 MED ORDER — SODIUM CHLORIDE 0.45 % IV SOLN
INTRAVENOUS | Status: DC
  Administered 2012-03-22: 14:00:00 via INTRAVENOUS

## 2012-03-22 ENCOUNTER — Encounter (HOSPITAL_COMMUNITY): Payer: Self-pay | Admitting: *Deleted

## 2012-03-22 ENCOUNTER — Ambulatory Visit (HOSPITAL_COMMUNITY)
Admission: RE | Admit: 2012-03-22 | Discharge: 2012-03-22 | Disposition: A | Discharge status: Home / Self Care | Payer: Medicaid Other | Source: Ambulatory Visit | Attending: Internal Medicine | Admitting: Internal Medicine

## 2012-03-22 ENCOUNTER — Encounter (HOSPITAL_COMMUNITY)
Admission: RE | Disposition: A | Discharge status: Home / Self Care | Payer: Self-pay | Source: Ambulatory Visit | Attending: Internal Medicine

## 2012-03-22 DIAGNOSIS — K449 Diaphragmatic hernia without obstruction or gangrene: Secondary | ICD-10-CM

## 2012-03-22 DIAGNOSIS — R112 Nausea with vomiting, unspecified: Secondary | ICD-10-CM

## 2012-03-22 DIAGNOSIS — E119 Type 2 diabetes mellitus without complications: Secondary | ICD-10-CM | POA: Insufficient documentation

## 2012-03-22 DIAGNOSIS — R933 Abnormal findings on diagnostic imaging of other parts of digestive tract: Secondary | ICD-10-CM

## 2012-03-22 DIAGNOSIS — R109 Unspecified abdominal pain: Secondary | ICD-10-CM | POA: Insufficient documentation

## 2012-03-22 DIAGNOSIS — I10 Essential (primary) hypertension: Secondary | ICD-10-CM | POA: Insufficient documentation

## 2012-03-22 HISTORY — PX: ESOPHAGOGASTRODUODENOSCOPY: SHX5428

## 2012-03-22 SURGERY — EGD (ESOPHAGOGASTRODUODENOSCOPY)
Anesthesia: Moderate Sedation

## 2012-03-22 MED ORDER — MIDAZOLAM HCL 2 MG/2ML IJ SOLN
INTRAMUSCULAR | Status: AC
  Filled 2012-03-22: qty 4

## 2012-03-22 MED ORDER — MEPERIDINE HCL 50 MG/ML IJ SOLN
INTRAMUSCULAR | Status: AC
  Filled 2012-03-22: qty 1

## 2012-03-22 MED ORDER — BUTAMBEN-TETRACAINE-BENZOCAINE 2-2-14 % EX AERO
INHALATION_SPRAY | CUTANEOUS | Status: DC | PRN
  Administered 2012-03-22: 2 via TOPICAL

## 2012-03-22 MED ORDER — SUCRALFATE 1 G PO TABS: 1.0000 g | ORAL_TABLET | Freq: Four times a day (QID) | ORAL | Status: DC

## 2012-03-22 MED ORDER — STERILE WATER FOR IRRIGATION IR SOLN
Status: DC | PRN
  Administered 2012-03-22: 14:00:00

## 2012-03-22 MED ORDER — MEPERIDINE HCL 25 MG/ML IJ SOLN
INTRAMUSCULAR | Status: DC | PRN
  Administered 2012-03-22 (×2): 25 mg via INTRAVENOUS

## 2012-03-22 MED ORDER — MIDAZOLAM HCL 5 MG/5ML IJ SOLN
INTRAMUSCULAR | Status: DC | PRN
  Administered 2012-03-22 (×4): 2 mg via INTRAVENOUS

## 2012-03-22 MED ORDER — MIDAZOLAM HCL 5 MG/5ML IJ SOLN
INTRAMUSCULAR | Status: AC
  Filled 2012-03-22: qty 5

## 2012-03-22 NOTE — H&P (Signed)
Amber Fischer is a 51 y.o. female.   Chief Complaint: Patient is here for EGD. HPI: Patient is 51 year old Caucasian female who presents with 4, history of intermittent epigastric pain radiating to right upper quadrant and right midabdomen associated with nausea and vomiting. She has not responded to PPI therapy. She had gastric emptying study with normal T. one half. Ultrasound reveals bile duct is less than 7 mm. She's had gallbladder removed 4. She has chronically elevated transaminases felt to be second to fatty liver although there is slight bump of these are back to baseline now. She has lost 15 pounds since her symptoms began to She continues to have intermittent hematochezia felt to be secondary to hemorrhoids. Her last colonoscopy was in July 2012.  Past Medical History  Diagnosis Date  . Rectal bleed 11/02/2010  . Epigastric abdominal pain 08/19/2010  . Sciatica   . Back pain, chronic   . Diabetes mellitus   . Hypertension   . Asthma     Past Surgical History  Procedure Date  . Colonoscopy w/ biopsies 12/24/2010    NUR  . Colonoscopy 11/30/2010  . Abdominal hysterectomy   . Cholecystectomy     History reviewed. No pertinent family history. Social History:  reports that she has quit smoking. Her smoking use included Cigarettes. She has a 7.5 pack-year smoking history. She does not have any smokeless tobacco history on file. She reports that she drinks alcohol. She reports that she does not use illicit drugs.  Allergies:  Allergies  Allergen Reactions  . Sulfonamide Derivatives Hives  . Bee Venom Swelling and Rash    Medications Prior to Admission  Medication Sig Dispense Refill  . albuterol (PROVENTIL HFA;VENTOLIN HFA) 108 (90 BASE) MCG/ACT inhaler Inhale 2 puffs into the lungs every 6 (six) hours as needed.      . ALPRAZolam (XANAX) 0.25 MG tablet Take 0.25 mg by mouth.        Marland Kitchen aspirin 81 MG tablet Take 81 mg by mouth daily.        . cetirizine (ZYRTEC) 10 MG  chewable tablet Chew 10 mg by mouth daily.        . cyclobenzaprine (FLEXERIL) 10 MG tablet Take 10 mg by mouth 3 (three) times daily as needed.        . DULoxetine (CYMBALTA) 60 MG capsule Take 60 mg by mouth daily.        Marland Kitchen esomeprazole (NEXIUM) 40 MG capsule Take 1 capsule (40 mg total) by mouth daily.  30 capsule  1  . esomeprazole (NEXIUM) 40 MG capsule Take 40 mg by mouth 2 (two) times daily.      Marland Kitchen gabapentin (NEURONTIN) 300 MG capsule Take 300 mg by mouth 3 (three) times daily.        . hydrochlorothiazide 25 MG tablet Take 25 mg by mouth daily.        . hydrOXYzine (VISTARIL) 25 MG capsule Take 25 mg by mouth 3 (three) times daily as needed.        Marland Kitchen levothyroxine (SYNTHROID, LEVOTHROID) 75 MCG tablet Take 75 mcg by mouth daily.        Marland Kitchen lidocaine (LIDODERM) 5 % Place 1 patch onto the skin daily. Remove & Discard patch within 12 hours or as directed by MD      . losartan (COZAAR) 50 MG tablet Take 50 mg by mouth daily.        . metformin (FORTAMET) 500 MG (OSM) 24 hr tablet Take 1,000  mg by mouth.       . metoprolol (TOPROL-XL) 100 MG 24 hr tablet Take 100 mg by mouth daily.        . traMADol (ULTRAM) 50 MG tablet Take 50 mg by mouth every 6 (six) hours as needed.        . metoCLOPramide (REGLAN) 5 MG tablet Take 1 tablet (5 mg total) by mouth 2 (two) times daily before a meal.  60 tablet  1    Results for orders placed during the hospital encounter of 03/22/12 (from the past 48 hour(s))  GLUCOSE, CAPILLARY     Status: Abnormal   Collection Time   03/22/12  1:43 PM      Component Value Range Comment   Glucose-Capillary 162 (*) 70 - 99 mg/dL    No results found.  ROS  Blood pressure 143/73, pulse 87, temperature 98 F (36.7 C), temperature source Oral, resp. rate 26, height 5' 9.5" (1.765 m), weight 271 lb (122.925 kg), SpO2 97.00%. Physical Exam  Constitutional: She appears well-developed and well-nourished.  HENT:  Mouth/Throat: Oropharynx is clear and moist.  Eyes:  Conjunctivae normal are normal. No scleral icterus.  Neck: No thyromegaly present.  Cardiovascular: Normal rate, regular rhythm and normal heart sounds.   Respiratory: Effort normal and breath sounds normal.  GI: Soft. She exhibits no distension and no mass. There is no tenderness.  Musculoskeletal: She exhibits no edema.  Lymphadenopathy:    She has no cervical adenopathy.  Neurological: She is alert.  Skin: Skin is warm and dry.     Assessment/Plan Recurrent abdominal pain with nausea and vomiting. Diagnostic EGD.  Shamiracle Gorden U 03/22/2012, 2:34 PM

## 2012-03-22 NOTE — Op Note (Addendum)
EGD PROCEDURE REPORT  PATIENT:  Amber Fischer  MR#:  [id number] Birthdate:  1961-02-01, 51 y.o., female Endoscopist:  Dr. Malissa Hippo, MD Referred By:  Dr. Toma Deiters, MD Procedure Date: 03/22/2012  Procedure:   EGD  Indications:  Patient is 51 year old Caucasian female with few months history of abdominal pain with nausea and vomiting. A thorough. Ultrasound revealed fatty liver but normal size bile duct. She also had normal gastric emptying study. Transaminases have been elevated secondary to fatty liver although have been higher recently than before. She is undergoing EGD looking for peptic ulcer disease.            Informed Consent:  The risks, benefits, alternatives & imponderables which include, but are not limited to, bleeding, infection, perforation, drug reaction and potential missed lesion have been reviewed.  The potential for biopsy, lesion removal, esophageal dilation, etc. have also been discussed.  Questions have been answered.  All parties agreeable.  Please see history & physical in medical record for more information.  Medications:  Demerol 50 mg IV Versed 8 mg IV Cetacaine spray topically for oropharyngeal anesthesia  Description of procedure:  The endoscope was introduced through the mouth and advanced to the second portion of the duodenum without difficulty or limitations. The mucosal surfaces were surveyed very carefully during advancement of the scope and upon withdrawal.  Findings:  Esophagus:  Mucosa of the esophagus was normal. Wavy GE junction but no erosions or ulcers noted. GEJ:  35 cm Hiatus:  38 cm Stomach:  Stomach was empty and distended very well with insufflation. Folds in the proximal stomach were normal. Examination of mucosa at body, antrum, pyloric channel, angularis, fundus and cardia was normal. Duodenum:  Normal bulbar and post bulbar mucosa.  Therapeutic/Diagnostic Maneuvers Performed:  None  Complications:  None  Impression: Small  sliding hiatal hernia otherwise normal EGD.  Recommendations:  Standard instructions given. Empiric therapy with Carafate 1 g by mouth a.c. and each bedtime. Abdominopelvic CT with contrast. Office will schedule. If she has another spell of abdominal pain will repeat her LFTs.   REHMAN,NAJEEB U  03/22/2012  2:57 PM  CC: Dr. Toma Deiters, MD & Dr. Bonnetta Barry ref. provider found

## 2012-03-23 ENCOUNTER — Other Ambulatory Visit (INDEPENDENT_AMBULATORY_CARE_PROVIDER_SITE_OTHER): Payer: Self-pay | Admitting: Internal Medicine

## 2012-03-23 ENCOUNTER — Telehealth (INDEPENDENT_AMBULATORY_CARE_PROVIDER_SITE_OTHER): Payer: Self-pay | Admitting: *Deleted

## 2012-03-23 DIAGNOSIS — R109 Unspecified abdominal pain: Secondary | ICD-10-CM

## 2012-03-23 DIAGNOSIS — K7689 Other specified diseases of liver: Secondary | ICD-10-CM

## 2012-03-23 DIAGNOSIS — K449 Diaphragmatic hernia without obstruction or gangrene: Secondary | ICD-10-CM

## 2012-03-23 DIAGNOSIS — R112 Nausea with vomiting, unspecified: Secondary | ICD-10-CM

## 2012-03-23 DIAGNOSIS — R6889 Other general symptoms and signs: Secondary | ICD-10-CM

## 2012-03-23 NOTE — Telephone Encounter (Signed)
Patient needs creatinine order done for CT, she will go to [facility] sometime between today and Monday -- Per Dr Karilyn Cota pt needs CT A/P w/ contrast  CT sch'd 03/28/12 at 11:00 (10:45), pick up contrast and have labs

## 2012-03-23 NOTE — Telephone Encounter (Signed)
Creatinine ordered.

## 2012-03-28 ENCOUNTER — Other Ambulatory Visit (INDEPENDENT_AMBULATORY_CARE_PROVIDER_SITE_OTHER): Payer: Self-pay | Admitting: Internal Medicine

## 2012-03-28 ENCOUNTER — Ambulatory Visit (HOSPITAL_COMMUNITY)
Admission: RE | Admit: 2012-03-28 | Discharge: 2012-03-28 | Disposition: A | Discharge status: Home / Self Care | Payer: Medicaid Other | Source: Ambulatory Visit | Attending: Internal Medicine | Admitting: Internal Medicine

## 2012-03-28 DIAGNOSIS — R6889 Other general symptoms and signs: Secondary | ICD-10-CM

## 2012-03-28 DIAGNOSIS — K7689 Other specified diseases of liver: Secondary | ICD-10-CM | POA: Insufficient documentation

## 2012-03-28 DIAGNOSIS — K449 Diaphragmatic hernia without obstruction or gangrene: Secondary | ICD-10-CM

## 2012-03-28 DIAGNOSIS — I10 Essential (primary) hypertension: Secondary | ICD-10-CM | POA: Insufficient documentation

## 2012-03-28 DIAGNOSIS — R112 Nausea with vomiting, unspecified: Secondary | ICD-10-CM

## 2012-03-28 DIAGNOSIS — R1011 Right upper quadrant pain: Secondary | ICD-10-CM | POA: Insufficient documentation

## 2012-03-28 DIAGNOSIS — R109 Unspecified abdominal pain: Secondary | ICD-10-CM

## 2012-03-28 DIAGNOSIS — E119 Type 2 diabetes mellitus without complications: Secondary | ICD-10-CM | POA: Insufficient documentation

## 2012-03-28 MED ORDER — IOHEXOL 300 MG/ML  SOLN
100.0000 mL | Freq: Once | INTRAMUSCULAR | Status: AC | PRN
  Administered 2012-03-28: 100 mL via INTRAVENOUS

## 2012-03-29 ENCOUNTER — Encounter (HOSPITAL_COMMUNITY): Payer: Self-pay | Admitting: Internal Medicine

## 2012-04-05 NOTE — Progress Notes (Signed)
Apt has been scheduled for 05/23/12 at 11:30 am with Dr. Karilyn Cota.

## 2012-04-05 NOTE — Progress Notes (Signed)
Apt has been scheduled for 05/23/12 at 11:30 am with Dr. Rehman. 

## 2012-05-23 ENCOUNTER — Encounter (INDEPENDENT_AMBULATORY_CARE_PROVIDER_SITE_OTHER): Payer: Self-pay | Admitting: Internal Medicine

## 2012-05-23 ENCOUNTER — Ambulatory Visit (INDEPENDENT_AMBULATORY_CARE_PROVIDER_SITE_OTHER): Payer: Medicaid Other | Admitting: Internal Medicine

## 2012-05-23 VITALS — BP 140/90 | HR 80 | Temp 97.8°F | Resp 20 | Ht 69.0 in | Wt 272.7 lb

## 2012-05-23 DIAGNOSIS — K3184 Gastroparesis: Secondary | ICD-10-CM

## 2012-05-23 DIAGNOSIS — R7989 Other specified abnormal findings of blood chemistry: Secondary | ICD-10-CM

## 2012-05-23 NOTE — Patient Instructions (Addendum)
Gastroparesis diet; instructions provided to the patient. Physician will contact you with results of blood work. Call office if you are able to get domperidone prescription.

## 2012-05-24 LAB — HEPATIC FUNCTION PANEL
ALT: 196 U/L — ABNORMAL HIGH (ref 0–35)
AST: 159 U/L — ABNORMAL HIGH (ref 0–37)
Albumin: 4.3 g/dL (ref 3.5–5.2)
Total Protein: 7.3 g/dL (ref 6.0–8.3)

## 2012-05-28 NOTE — Progress Notes (Signed)
Presenting complaint;  Nausea vomiting and epigastric pain.  Subjective:  Patient is 51 year old Caucasian female who is here for scheduled visit. She was seen 3 months ago  by Ms. Dorene Ar, NP. She feels about the same. She has nausea and vomiting every couple of days if not daily and she continues to complain of epigastric pain. 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 He was seen in emergency room at [facility] in discharge. She says her glucose levels been running high. She says when she goes to emergency room with her nausea vomiting they do not believe her story because of her weight. She has not lost any weight since her last visit. She denies hematemesis melena or rectal bleeding. She has intermittent neck pain for which uses Flexeril. She is on Cymbalta for her nerves. She is accompanied by her mother today.  Current Medications: Current Outpatient Prescriptions  Medication Sig Dispense Refill  . albuterol (PROVENTIL HFA;VENTOLIN HFA) 108 (90 BASE) MCG/ACT inhaler Inhale 2 puffs into the lungs every 6 (six) hours as needed.      . ALPRAZolam (XANAX) 0.25 MG tablet Take 0.5 mg by mouth as needed.       Marland Kitchen aspirin 81 MG tablet Take 81 mg by mouth daily.        . cetirizine (ZYRTEC) 10 MG chewable tablet Chew 10 mg by mouth daily.        . cyclobenzaprine (FLEXERIL) 10 MG tablet Take 10 mg by mouth 3 (three) times daily as needed.        . Diclofenac Potassium 50 MG PACK Take by mouth 2 (two) times daily.      . DULoxetine (CYMBALTA) 60 MG capsule Take 60 mg by mouth daily.        Marland Kitchen esomeprazole (NEXIUM) 40 MG capsule Take 40 mg by mouth 2 (two) times daily.      . Fluticasone-Salmeterol (ADVAIR) 250-50 MCG/DOSE AEPB Inhale 1 puff into the lungs every 12 (twelve) hours.      . gabapentin (NEURONTIN) 300 MG capsule Take 300 mg by mouth 3 (three) times daily.        . hydrochlorothiazide 25 MG tablet Take 25 mg by mouth daily.        Marland Kitchen  levothyroxine (SYNTHROID, LEVOTHROID) 75 MCG tablet Take 75 mcg by mouth daily.        Marland Kitchen lidocaine (LIDODERM) 5 % Place 1 patch onto the skin daily. Remove & Discard patch within 12 hours or as directed by MD      . losartan (COZAAR) 50 MG tablet Take 50 mg by mouth daily.        . metformin (FORTAMET) 500 MG (OSM) 24 hr tablet Take 1,000 mg by mouth.       . metoprolol (TOPROL-XL) 100 MG 24 hr tablet Take 100 mg by mouth daily. Patient is taking 1 1/2 tablet daily      . traZODone (DESYREL) 100 MG tablet Take 100 mg by mouth as needed.        Objective: Blood pressure 140/90, pulse 80, temperature 97.8 F (36.6 C), temperature source Oral, resp. rate 20, height 5\' 9"  (1.753 m), weight 272 lb 11.2 oz (123.696 kg). Patient is alert and in no acute distress. Conjunctiva is pink. Sclera is nonicteric Oropharyngeal mucosa is normal. No neck masses or thyromegaly noted. Cardiac exam with regular rhythm normal S1 and S2. No murmur or gallop noted. Lungs are clear to auscultation.  Abdomen is obese. Bowel sounds are normal. On palpation soft abdomen with mild midepigastric tenderness. Liver edge is indistinct. No LE edema or clubbing noted.  Labs/studies Results: LFTs from 02/24/2012 AST 163 and ALT 216; total bilirubin 1.4 and direct was 0.2; AP 108 and albumin 4.5 LFTs from 03/16/2012 bili oh 0.9, AP 112, AST 101 and ALT 141.    Assessment:  #1. Gastroparesis. She needs to be on gastroparesis is diet. She had excellent response with Reglan but unfortunately she cannot stay on that medication for long. EGD in March 2012 revealed small sliding hiatal hernia otherwise normal exam. NSAID use may be contribution to some of her symptoms. If she has another episode of significant pain will check her LFTs to make sure she does not have SOD dysfunction in addition to fatty liver. #2. Elevated transaminases most likely secondary to fatty liver. Last  ultrasound was in March 2012. CBD was not  dilated.   Plan:  Literature provided regarding gastroparesis diet. [place] is able to do so she will let us know. Patient needs to follow with her PCP regarding poorly controlled diabetes mellitus. She will go to the lab for LFTs. Office visit in 3 months.

## 2012-05-31 ENCOUNTER — Telehealth (INDEPENDENT_AMBULATORY_CARE_PROVIDER_SITE_OTHER): Payer: Self-pay | Admitting: *Deleted

## 2012-05-31 DIAGNOSIS — K760 Fatty (change of) liver, not elsewhere classified: Secondary | ICD-10-CM

## 2012-05-31 DIAGNOSIS — R7401 Elevation of levels of liver transaminase levels: Secondary | ICD-10-CM

## 2012-05-31 NOTE — Telephone Encounter (Signed)
Per Dr.Rehman the patient will need to have repeated in 3 months,noted for March

## 2012-06-09 DIAGNOSIS — F411 Generalized anxiety disorder: Secondary | ICD-10-CM

## 2012-06-09 DIAGNOSIS — N289 Disorder of kidney and ureter, unspecified: Secondary | ICD-10-CM | POA: Insufficient documentation

## 2012-06-09 DIAGNOSIS — I10 Essential (primary) hypertension: Secondary | ICD-10-CM | POA: Insufficient documentation

## 2012-06-09 DIAGNOSIS — I951 Orthostatic hypotension: Secondary | ICD-10-CM | POA: Insufficient documentation

## 2012-06-09 DIAGNOSIS — E876 Hypokalemia: Secondary | ICD-10-CM

## 2012-06-09 DIAGNOSIS — Z0389 Encounter for observation for other suspected diseases and conditions ruled out: Secondary | ICD-10-CM | POA: Insufficient documentation

## 2012-06-09 HISTORY — DX: Generalized anxiety disorder: F41.1

## 2012-06-09 HISTORY — DX: Orthostatic hypotension: I95.1

## 2012-06-09 HISTORY — DX: Hypomagnesemia: E83.42

## 2012-06-09 HISTORY — DX: Hypokalemia: E87.6

## 2012-08-10 ENCOUNTER — Telehealth (INDEPENDENT_AMBULATORY_CARE_PROVIDER_SITE_OTHER): Payer: Self-pay | Admitting: *Deleted

## 2012-08-10 NOTE — Telephone Encounter (Signed)
Prior Authorization needed for Nexium 40 mg take 1 by mouth twice a day #60. Insurance called @ [phone] spoke with Edmon Crape and this was approved PA number [id number] . [facility]

## 2012-08-17 ENCOUNTER — Other Ambulatory Visit (INDEPENDENT_AMBULATORY_CARE_PROVIDER_SITE_OTHER): Payer: Self-pay | Admitting: *Deleted

## 2012-08-17 ENCOUNTER — Encounter (INDEPENDENT_AMBULATORY_CARE_PROVIDER_SITE_OTHER): Payer: Self-pay | Admitting: *Deleted

## 2012-08-17 DIAGNOSIS — K760 Fatty (change of) liver, not elsewhere classified: Secondary | ICD-10-CM

## 2012-08-22 ENCOUNTER — Ambulatory Visit (INDEPENDENT_AMBULATORY_CARE_PROVIDER_SITE_OTHER): Payer: Medicaid Other | Admitting: Internal Medicine

## 2012-09-13 DIAGNOSIS — G8929 Other chronic pain: Secondary | ICD-10-CM | POA: Insufficient documentation

## 2012-09-18 DIAGNOSIS — Z9861 Coronary angioplasty status: Secondary | ICD-10-CM | POA: Insufficient documentation

## 2012-10-03 ENCOUNTER — Institutional Professional Consult (permissible substitution): Payer: Medicaid Other | Admitting: Cardiovascular Disease

## 2013-01-05 ENCOUNTER — Other Ambulatory Visit (INDEPENDENT_AMBULATORY_CARE_PROVIDER_SITE_OTHER): Payer: Self-pay | Admitting: Internal Medicine

## 2013-01-19 DIAGNOSIS — R7309 Other abnormal glucose: Secondary | ICD-10-CM

## 2013-01-30 NOTE — Telephone Encounter (Signed)
LM for Haizlee to return the call to get a f/u apt scheduled.

## 2013-01-31 NOTE — Telephone Encounter (Signed)
LM for patient to return the call to schedule a f/u apt.   

## 2013-02-14 NOTE — Telephone Encounter (Signed)
Apt has been scheduled for 05/28/13 with Dr. Karilyn Cota.

## 2013-03-13 ENCOUNTER — Other Ambulatory Visit (INDEPENDENT_AMBULATORY_CARE_PROVIDER_SITE_OTHER): Payer: Self-pay | Admitting: *Deleted

## 2013-03-13 ENCOUNTER — Encounter (INDEPENDENT_AMBULATORY_CARE_PROVIDER_SITE_OTHER): Payer: Self-pay | Admitting: Internal Medicine

## 2013-03-13 ENCOUNTER — Ambulatory Visit (INDEPENDENT_AMBULATORY_CARE_PROVIDER_SITE_OTHER): Payer: Medicaid Other | Admitting: Internal Medicine

## 2013-03-13 ENCOUNTER — Telehealth (INDEPENDENT_AMBULATORY_CARE_PROVIDER_SITE_OTHER): Payer: Self-pay | Admitting: *Deleted

## 2013-03-13 VITALS — BP 108/80 | HR 72 | Temp 97.9°F | Ht 69.5 in | Wt 266.9 lb

## 2013-03-13 DIAGNOSIS — K219 Gastro-esophageal reflux disease without esophagitis: Secondary | ICD-10-CM

## 2013-03-13 DIAGNOSIS — Z1211 Encounter for screening for malignant neoplasm of colon: Secondary | ICD-10-CM

## 2013-03-13 DIAGNOSIS — R748 Abnormal levels of other serum enzymes: Secondary | ICD-10-CM

## 2013-03-13 DIAGNOSIS — R1013 Epigastric pain: Secondary | ICD-10-CM

## 2013-03-13 DIAGNOSIS — K625 Hemorrhage of anus and rectum: Secondary | ICD-10-CM

## 2013-03-13 HISTORY — DX: Gastro-esophageal reflux disease without esophagitis: K21.9

## 2013-03-13 LAB — COMPREHENSIVE METABOLIC PANEL
AST: 36 U/L (ref 0–37)
Albumin: 4.6 g/dL (ref 3.5–5.2)
Alkaline Phosphatase: 74 U/L (ref 39–117)
BUN: 13 mg/dL (ref 6–23)
Creat: 0.79 mg/dL (ref 0.50–1.10)
Glucose, Bld: 100 mg/dL — ABNORMAL HIGH (ref 70–99)
Potassium: 4.3 mEq/L (ref 3.5–5.3)
Total Bilirubin: 1 mg/dL (ref 0.3–1.2)

## 2013-03-13 LAB — CBC WITH DIFFERENTIAL/PLATELET
Basophils Absolute: 0.1 10*3/uL (ref 0.0–0.1)
Basophils Relative: 1 % (ref 0–1)
Eosinophils Absolute: 0.4 10*3/uL (ref 0.0–0.7)
Eosinophils Relative: 5 % (ref 0–5)
HCT: 42.3 % (ref 36.0–46.0)
Hemoglobin: 14.2 g/dL (ref 12.0–15.0)
MCH: 29 pg (ref 26.0–34.0)
MCHC: 33.6 g/dL (ref 30.0–36.0)
MCV: 86.5 fL (ref 78.0–100.0)
Monocytes Absolute: 0.8 10*3/uL (ref 0.1–1.0)
Monocytes Relative: 9 % (ref 3–12)
Neutro Abs: 4 10*3/uL (ref 1.7–7.7)
Platelets: 292 10*3/uL (ref 150–400)
RDW: 14 % (ref 11.5–15.5)

## 2013-03-13 NOTE — Patient Instructions (Addendum)
Will schedule a colonoscopy and EGD with Dr. Karilyn Cota.

## 2013-03-13 NOTE — Progress Notes (Signed)
Subjective:     Patient ID: Amber Fischer, female   DOB: 10/27/1960, 52 y.o.   MRN: [id number]  HPI Presents today with c/o abdominal pain and rectal bleeding. She c/o epigastric pain. She says when she eats she has pain. She says she has swelling to her epigastric region. She says she does have acid reflux. She has some nausea. She says when she has nausea she says it feels like something is cutting in her stomach.  She says when she is asleep, she says she feels like she is choking. She feels like she has a chest cold. Symptoms x 3 months.  She also tells me she has had some rectal bleeding. When she has a BM she says she had rectal bleeding. She says she has rectal bleeding every day. It discolors the water in the commode.  She says when she goes to bed, she will continue to have rectal bleeding and she has noticed clots in the bed. Seen at [facility] a couple of weeks ago for chest pain. Her work up was normal.  Appetite is okay. She has had weight loss of 30 pounds which she says it was ;unintentional.   Blood sugars usually 114-150.   Cardiac stent in March of 2014. Takes Brilinta daily (98% blockage)  03/22/2012 EGD: nausea, abdominal pain Impression:  Small sliding hiatal hernia otherwise normal EGD. 12/24/2010 Colonoscopy for intermittent rectal bleeding:  FINAL DIAGNOSES:  1. Examination performed to cecum.  2. Two small polyps ablated via cold biopsy (hepatic flexure and  sigmoid colon).  3. External hemorrhoids felt to be the source of the patient's rectal  bleeding. Biopsy: Colon poly at hepatic flexure: tubular adenoma. Polypoid fragment of benign colonic mucosa. No high grade dysplasia or malignancy. Colon polyp sigmoid 30cm; Hyperplastic polyp. No adenomatous change or malignancy identified. Review of Systems     Current Outpatient Prescriptions on File Prior to Visit  Medication Sig Dispense Refill  . albuterol (PROVENTIL HFA;VENTOLIN HFA) 108 (90 BASE) MCG/ACT inhaler  Inhale 2 puffs into the lungs every 6 (six) hours as needed.      Marland Kitchen aspirin 81 MG tablet Take 81 mg by mouth daily.        . cetirizine (ZYRTEC) 10 MG chewable tablet Chew 10 mg by mouth daily.        . Diclofenac Potassium 50 MG PACK Take by mouth 2 (two) times daily.      . DULoxetine (CYMBALTA) 60 MG capsule Take 60 mg by mouth daily.        . Fluticasone-Salmeterol (ADVAIR) 250-50 MCG/DOSE AEPB Inhale 1 puff into the lungs every 12 (twelve) hours.      . gabapentin (NEURONTIN) 300 MG capsule Take 600 mg by mouth 3 (three) times daily.       Marland Kitchen levothyroxine (SYNTHROID, LEVOTHROID) 75 MCG tablet Take 75 mcg by mouth daily.        Marland Kitchen lidocaine (LIDODERM) 5 % Place 1 patch onto the skin as needed. Remove & Discard patch within 12 hours or as directed by MD      . losartan (COZAAR) 50 MG tablet Take 50 mg by mouth daily.        . metformin (FORTAMET) 500 MG (OSM) 24 hr tablet Take 1,000 mg by mouth.       . metoprolol (TOPROL-XL) 100 MG 24 hr tablet Take 100 mg by mouth daily. Patient is taking 1 1/2 tablet daily      . NEXIUM 40 MG capsule  TAKE ONE CAPSULE BY MOUTH TWICE DAILY  60 capsule  5   No current facility-administered medications on file prior to visit.   Past Medical History  Diagnosis Date  . Rectal bleed 11/02/2010  . Epigastric abdominal pain 08/19/2010  . Sciatica   . Back pain, chronic   . Diabetes mellitus   . Hypertension   . Asthma    Past Surgical History  Procedure Laterality Date  . Colonoscopy w/ biopsies  12/24/2010    NUR  . Colonoscopy  11/30/2010  . Abdominal hysterectomy    . Cholecystectomy    . Esophagogastroduodenoscopy  03/22/2012    Procedure: ESOPHAGOGASTRODUODENOSCOPY (EGD);  Surgeon: Malissa Hippo, MD;  Location: [facility];  Service: Endoscopy;  Laterality: N/A;  300  . Coronary angioplasty with stent placement      March of 2014   Allergies  Allergen Reactions  . Ciprofloxacin Itching    Patient also experienced N&V  . Sulfonamide  Derivatives Hives  . Bee Venom Swelling and Rash   She is divorced. She has 3 children. One child may have MS. Other two in good health. She is unemployed. She is trying to get disability. Objective:   Physical Exam  Filed Vitals:   03/13/13 1433  BP: 108/80  Pulse: 72  Temp: 97.9 F (36.6 C)  Height: 5' 9.5" (1.765 m)  Weight: 266 lb 14.4 oz (121.065 kg)   Alert and oriented. Skin warm and dry. Oral mucosa is moist.   . Sclera anicteric, conjunctivae is pink. Thyroid not enlarged. No cervical lymphadenopathy. Lungs clear. Heart regular rate and rhythm.  Abdomen is soft. Bowel sounds are positive. No hepatomegaly.    Stool brown and guaiac negative.    Assessment:    GERD: not controlled at this time. Her last EGD in 2013 essentially normal.  Rectal bleeding: ? Hemorrhoidal, diverticular. Colonic neoplasm also needs to be ruled out. Patient is presently taking Brilanta and ASA daily.    Hx of elevated tranaminases.  Plan:    EGD/Colonoscopy. CBC, CMET today

## 2013-03-13 NOTE — Telephone Encounter (Signed)
Patient needs trilyte 

## 2013-03-14 MED ORDER — PEG 3350-KCL-NA BICARB-NACL 420 G PO SOLR: 4000.0000 mL | Freq: Once | ORAL | Status: DC

## 2013-03-22 ENCOUNTER — Encounter (HOSPITAL_COMMUNITY): Payer: Self-pay | Admitting: Pharmacy Technician

## 2013-04-05 ENCOUNTER — Encounter (HOSPITAL_COMMUNITY): Payer: Self-pay

## 2013-04-05 ENCOUNTER — Ambulatory Visit (HOSPITAL_COMMUNITY)
Admission: RE | Admit: 2013-04-05 | Discharge: 2013-04-05 | Disposition: A | Discharge status: Home / Self Care | Payer: Medicaid Other | Source: Ambulatory Visit | Attending: Internal Medicine | Admitting: Internal Medicine

## 2013-04-05 ENCOUNTER — Encounter (HOSPITAL_COMMUNITY)
Admission: RE | Disposition: A | Discharge status: Home / Self Care | Payer: Self-pay | Source: Ambulatory Visit | Attending: Internal Medicine

## 2013-04-05 DIAGNOSIS — R1310 Dysphagia, unspecified: Secondary | ICD-10-CM | POA: Insufficient documentation

## 2013-04-05 DIAGNOSIS — K219 Gastro-esophageal reflux disease without esophagitis: Secondary | ICD-10-CM

## 2013-04-05 DIAGNOSIS — Z01812 Encounter for preprocedural laboratory examination: Secondary | ICD-10-CM | POA: Insufficient documentation

## 2013-04-05 DIAGNOSIS — K5900 Constipation, unspecified: Secondary | ICD-10-CM | POA: Insufficient documentation

## 2013-04-05 DIAGNOSIS — R1013 Epigastric pain: Secondary | ICD-10-CM

## 2013-04-05 DIAGNOSIS — K625 Hemorrhage of anus and rectum: Secondary | ICD-10-CM

## 2013-04-05 DIAGNOSIS — K449 Diaphragmatic hernia without obstruction or gangrene: Secondary | ICD-10-CM

## 2013-04-05 DIAGNOSIS — K644 Residual hemorrhoidal skin tags: Secondary | ICD-10-CM | POA: Insufficient documentation

## 2013-04-05 DIAGNOSIS — I10 Essential (primary) hypertension: Secondary | ICD-10-CM | POA: Insufficient documentation

## 2013-04-05 DIAGNOSIS — E119 Type 2 diabetes mellitus without complications: Secondary | ICD-10-CM | POA: Insufficient documentation

## 2013-04-05 HISTORY — PX: COLONOSCOPY, ESOPHAGOGASTRODUODENOSCOPY (EGD) AND ESOPHAGEAL DILATION: SHX5781

## 2013-04-05 LAB — GLUCOSE, CAPILLARY: Glucose-Capillary: 121 mg/dL — ABNORMAL HIGH (ref 70–99)

## 2013-04-05 SURGERY — COLONOSCOPY, ESOPHAGOGASTRODUODENOSCOPY (EGD) AND ESOPHAGEAL DILATION (ED)
Anesthesia: Moderate Sedation

## 2013-04-05 MED ORDER — RANITIDINE HCL 150 MG PO CAPS: 150.0000 mg | ORAL_CAPSULE | Freq: Every evening | ORAL | Status: DC

## 2013-04-05 MED ORDER — MEPERIDINE HCL 50 MG/ML IJ SOLN
INTRAMUSCULAR | Status: AC
  Filled 2013-04-05: qty 1

## 2013-04-05 MED ORDER — STERILE WATER FOR IRRIGATION IR SOLN
Status: DC | PRN
  Administered 2013-04-05: 14:00:00

## 2013-04-05 MED ORDER — MIDAZOLAM HCL 5 MG/5ML IJ SOLN
INTRAMUSCULAR | Status: DC | PRN
  Administered 2013-04-05 (×5): 2 mg via INTRAVENOUS

## 2013-04-05 MED ORDER — MEPERIDINE HCL 50 MG/ML IJ SOLN
INTRAMUSCULAR | Status: DC | PRN
  Administered 2013-04-05 (×2): 25 mg via INTRAVENOUS

## 2013-04-05 MED ORDER — SODIUM CHLORIDE 0.9 % IV SOLN
INTRAVENOUS | Status: DC
  Administered 2013-04-05: 13:00:00 via INTRAVENOUS

## 2013-04-05 MED ORDER — BUTAMBEN-TETRACAINE-BENZOCAINE 2-2-14 % EX AERO
INHALATION_SPRAY | CUTANEOUS | Status: DC | PRN
  Administered 2013-04-05: 2 via TOPICAL

## 2013-04-05 MED ORDER — MIDAZOLAM HCL 5 MG/5ML IJ SOLN
INTRAMUSCULAR | Status: AC
  Filled 2013-04-05: qty 10

## 2013-04-05 NOTE — H&P (Addendum)
Amber Fischer is an 53 y.o. female.   Chief Complaint: Patient is here for EGD, ED and colonoscopy. HPI: Patient is 52 year old Caucasian female who has chronic GERD and symptoms are poorly controlled with therapy. She also presents with  six-month history of dysphagia primarily to solids. She points to the suprasternal area as a site of bolus obstruction. She has good appetite. She has lost few pounds voluntarily. She also complains of change in her bowel habits with constipation and rectal bleeding. This started about 2 months ago. Last colonoscopy was in July 2012 with removal of 2 polyps and one was tubular adenoma. Last EGD was one year ago revealing small sliding hiatal hernia. Family history is negative for CRC.  Past Medical History  Diagnosis Date  . Rectal bleed 11/02/2010  . Epigastric abdominal pain 08/19/2010  . Sciatica   . Back pain, chronic   . Diabetes mellitus   . Hypertension   . Asthma     Past Surgical History  Procedure Laterality Date  . Colonoscopy w/ biopsies  12/24/2010    NUR  . Colonoscopy  11/30/2010  . Abdominal hysterectomy    . Cholecystectomy    . Esophagogastroduodenoscopy  03/22/2012    Procedure: ESOPHAGOGASTRODUODENOSCOPY (EGD);  Surgeon: Malissa Hippo, MD;  Location: [facility];  Service: Endoscopy;  Laterality: N/A;  300  . Coronary angioplasty with stent placement      March of 2014    History reviewed. No pertinent family history. Social History:  reports that she has quit smoking. Her smoking use included Cigarettes. She has a 7.5 pack-year smoking history. She has never used smokeless tobacco. She reports that she does not drink alcohol or use illicit drugs.  Allergies:  Allergies  Allergen Reactions  . Ciprofloxacin Itching and Nausea And Vomiting  . Sulfonamide Derivatives Hives  . Bee Venom Swelling and Rash    Medications Prior to Admission  Medication Sig Dispense Refill  . albuterol (PROVENTIL HFA;VENTOLIN HFA) 108 (90  BASE) MCG/ACT inhaler Inhale 2 puffs into the lungs every 6 (six) hours as needed for wheezing.       Marland Kitchen aspirin 81 MG tablet Take 81 mg by mouth daily.        . cetirizine (ZYRTEC) 10 MG tablet Take 10 mg by mouth daily.      . DULoxetine (CYMBALTA) 60 MG capsule Take 60 mg by mouth daily.        Marland Kitchen esomeprazole (NEXIUM) 40 MG capsule Take 40 mg by mouth daily before breakfast.      . Fluticasone-Salmeterol (ADVAIR) 250-50 MCG/DOSE AEPB Inhale 1 puff into the lungs every 12 (twelve) hours.      . gabapentin (NEURONTIN) 300 MG capsule Take 600 mg by mouth 3 (three) times daily.       Marland Kitchen HYDROcodone-acetaminophen (NORCO) 7.5-325 MG per tablet Take 1 tablet by mouth every 6 (six) hours as needed for pain.      Marland Kitchen insulin glargine (LANTUS) 100 UNIT/ML injection Inject 20 Units into the skin at bedtime.      Marland Kitchen levothyroxine (SYNTHROID, LEVOTHROID) 75 MCG tablet Take 75 mcg by mouth daily.        Marland Kitchen lidocaine (LIDODERM) 5 % Place 1 patch onto the skin as needed. Remove & Discard patch within 12 hours or as directed by MD      . losartan (COZAAR) 50 MG tablet Take 50 mg by mouth daily.        . magnesium oxide (MAG-OX) 400  MG tablet Take 400 mg by mouth daily.      . metformin (FORTAMET) 500 MG (OSM) 24 hr tablet Take 1,000 mg by mouth 2 (two) times daily before a meal.       . metoprolol (TOPROL-XL) 100 MG 24 hr tablet Take 150 mg by mouth daily.       . polyethylene glycol-electrolytes (NULYTELY/GOLYTELY) 420 G solution Take 4,000 mLs by mouth once.  4000 mL  0  . potassium chloride (K-DUR,KLOR-CON) 10 MEQ tablet Take 10 mEq by mouth 2 (two) times daily.      Marland Kitchen rOPINIRole (REQUIP) 1 MG tablet Take 1 mg by mouth at bedtime.      . Ticagrelor (BRILINTA) 90 MG TABS tablet Take 90 mg by mouth 2 (two) times daily.         Results for orders placed during the hospital encounter of 04/05/13 (from the past 48 hour(s))  GLUCOSE, CAPILLARY     Status: Abnormal   Collection Time    04/05/13 12:42 PM      Result  Value Range   Glucose-Capillary 121 (*) 70 - 99 mg/dL   No results found.  ROS  Blood pressure 125/71, pulse 88, temperature 97.5 F (36.4 C), temperature source Oral, resp. rate 16, height 5' 9.5" (1.765 m), weight 265 lb (120.203 kg), SpO2 97.00%. Physical Exam  Constitutional: She appears well-developed and well-nourished.  HENT:  Mouth/Throat: Oropharynx is clear and moist.  Eyes: Conjunctivae are normal. No scleral icterus.  Neck: No thyromegaly present.  Cardiovascular: Normal rate, regular rhythm and normal heart sounds.   No murmur heard. Respiratory: Effort normal and breath sounds normal.  GI: Soft. She exhibits no distension. There is tenderness (mild tenderness across lower abdomen.).  Musculoskeletal: She exhibits no edema.  Lymphadenopathy:    She has no cervical adenopathy.  Neurological: She is alert.  Skin: Skin is warm and dry.     Assessment/Plan; Poorly controlled GERD. Solid food dysphagia. Constipation and rectal bleeding. EGD, ED and colonoscopy.  Gustaf Mccarter U 04/05/2013, 1:23 PM

## 2013-04-05 NOTE — Op Note (Signed)
EGD PROCEDURE REPORT  PATIENT:  Amber Fischer  MR#:  [id number] Birthdate:  28-Feb-1961, 52 y.o., female Endoscopist:  Dr. Malissa Hippo, MD Referred By:  Dr. Pervis Hocking, MD Procedure Date: 04/05/2013  Procedure:   EGD, ED & Colonoscopy.  Indications:  Patient is 52 year old Caucasian female who has chronic GERD now poorly controlled and medication. She is also complaining of solid food dysphagia which started 6 months ago. She also has noted recent onset of constipation and has had few episodes of rectal bleeding and on one occasion passed large amount of blood per rectum. Recent CBC was normal.  Last colonoscopy was in July 2012 with removal of two polyps and one was an adenoma. Because of her symptoms she is worried that lesion could have been missed.             Informed Consent:  The risks, benefits, alternatives & imponderables which include, but are not limited to, bleeding, infection, perforation, drug reaction and potential missed lesion have been reviewed.  The potential for biopsy, lesion removal, esophageal dilation, etc. have also been discussed.  Questions have been answered.  All parties agreeable.  Please see history & physical in medical record for more information.  Medications:  Demerol 50 mg IV Versed 10 mg IV Cetacaine spray topically for oropharyngeal anesthesia  EGD  Description of procedure:  The endoscope was introduced through the mouth and advanced to the second portion of the duodenum without difficulty or limitations. The mucosal surfaces were surveyed very carefully during advancement of the scope and upon withdrawal.  Findings:  Esophagus:   Normal mucosa of the esophagus. GE junction was maybe but no ring or stricture was noted. GEJ:  38 cm Hiatus:  40 cm Stomach:  Stomach was empty and distended very well with insufflation. Folds in the proximal stomach were normal. Examination of mucosa and body, antrum, pyloric channel, angularis, fundus and cardia was  normal. Duodenum:  Normal bulbar and post bulbar mucosa.  Therapeutic/Diagnostic Maneuvers Performed:  Esophagus was dilated by passing 56 [place] Maloney dilator to full insertion. As the dilator was withdrawn and the scope was passed again and linear mucosal destruction noted just below the UES indicative of a web.  COLONOSCOPY Description of procedure:  After a digital rectal exam was performed, that colonoscope was advanced from the anus through the rectum and colon to the area of the cecum, ileocecal valve and appendiceal orifice. The cecum was deeply intubated. These structures were well-seen and photographed for the record. From the level of the cecum and ileocecal valve, the scope was slowly and cautiously withdrawn. The mucosal surfaces were carefully surveyed utilizing scope tip to flexion to facilitate fold flattening as needed. The scope was pulled down into the rectum where a thorough exam including retroflexion was performed.  Findings:   Prep excellent. Normal mucosa of colon and rectum. Small hemorrhoids noted below the dentate line along with 2 anal papillae.  Therapeutic/Diagnostic Maneuvers Performed:  None  Complications:  None  Cecal Withdrawal Time:  9 minutes  Impression:  EGD findings; Small sliding hiatal hernia otherwise normal examination. Esophageal dilation with 56 [place] Maloney dilator resulted in small mucosal destruction or cervical esophagus indicative of of a web.  Colonoscopy findings; Normal colonoscopy except external hemorrhoids. No evidence of recurrent polyps.  Recommendations:  Anti-reflux measures reinforced. Continue Nexium 40 mg by mouth every morning. Ranitidine 150 mg by mouth each bedtime. Take polyethylene glycol 17 g by mouth daily. Office visit in 8  weeks.  REHMAN,NAJEEB U  04/05/2013 2:11 PM  CC: Dr. Toma Deiters, MD & Dr. Bonnetta Barry ref. provider found

## 2013-04-06 ENCOUNTER — Encounter (HOSPITAL_COMMUNITY): Payer: Self-pay | Admitting: Internal Medicine

## 2013-05-28 ENCOUNTER — Ambulatory Visit (INDEPENDENT_AMBULATORY_CARE_PROVIDER_SITE_OTHER): Payer: Medicaid Other | Admitting: Internal Medicine

## 2013-07-02 ENCOUNTER — Other Ambulatory Visit (INDEPENDENT_AMBULATORY_CARE_PROVIDER_SITE_OTHER): Payer: Self-pay | Admitting: Internal Medicine

## 2013-09-20 ENCOUNTER — Ambulatory Visit (INDEPENDENT_AMBULATORY_CARE_PROVIDER_SITE_OTHER): Payer: Medicaid Other | Admitting: Cardiovascular Disease

## 2013-09-20 ENCOUNTER — Encounter: Payer: Self-pay | Admitting: Cardiovascular Disease

## 2013-09-20 VITALS — BP 124/88 | HR 69 | Ht 69.0 in | Wt 279.0 lb

## 2013-09-20 DIAGNOSIS — I10 Essential (primary) hypertension: Secondary | ICD-10-CM

## 2013-09-20 DIAGNOSIS — Z9861 Coronary angioplasty status: Secondary | ICD-10-CM

## 2013-09-20 DIAGNOSIS — I2510 Atherosclerotic heart disease of native coronary artery without angina pectoris: Secondary | ICD-10-CM | POA: Insufficient documentation

## 2013-09-20 DIAGNOSIS — R079 Chest pain, unspecified: Secondary | ICD-10-CM

## 2013-09-20 DIAGNOSIS — E785 Hyperlipidemia, unspecified: Secondary | ICD-10-CM

## 2013-09-20 DIAGNOSIS — E039 Hypothyroidism, unspecified: Secondary | ICD-10-CM

## 2013-09-20 DIAGNOSIS — Z955 Presence of coronary angioplasty implant and graft: Secondary | ICD-10-CM

## 2013-09-20 DIAGNOSIS — E119 Type 2 diabetes mellitus without complications: Secondary | ICD-10-CM

## 2013-09-20 NOTE — Patient Instructions (Signed)
   Lab for lipid panel & HgA1C.  Reminder:  Nothing to eat or drink after 12 midnight prior to labs. Your physician has requested that you have an echocardiogram. Echocardiography is a painless test that uses sound waves to create images of your heart. It provides your doctor with information about the size and shape of your heart and how well your heart's chambers and valves are working. This procedure takes approximately one hour. There are no restrictions for this procedure. Office will contact with results via phone or letter.   Endocrinology referral for diabetes management Continue all current medications. Your physician wants you to follow up in:  3 months.  You will receive a reminder letter in the mail one-two months in advance.  If you don't receive a letter, please call our office to schedule the follow up appointment

## 2013-09-20 NOTE — Progress Notes (Signed)
Patient ID: Amber Fischer, female   DOB: Sep 04, 1960, 53 y.o.   MRN: [id number]       CARDIOLOGY CONSULT NOTE  Patient ID: Amber Fischer MRN: [id number] DOB/AGE: 01-05-1961 53 y.o.  Admit date: (Not on file) Primary Physician Neale Burly, MD  Reason for Consultation: CAD  HPI: The patient is a 53 year old woman with a history of coronary artery disease and a previously placed right coronary artery stent, who I am meeting for the first time today. Her most recent cardiac catheterization was performed on 10/26/2012, which showed a patent RCA stent, 30% stenosis in the distal LAD, and 40% stenosis in the distal left circumflex coronary artery. I do not see that left ventriculography was performed. Her RCA stent was placed on 09/18/2012. She reportedly did not participate in cardiac rehabilitation afterwards. She also has a history of hyperlipidemia, COPD, depression, fibromyalgia, GERD, hypertension, hypothyroidism, diabetes mellitus, and sleep apnea. She is a former smoker and quit in October 2012. The most recent ECG I find in the chart was performed on 08/20/2012 which showed normal sinus rhythm with a very mild nonspecific ST segment abnormality.  She says that prior to her stent placement, she felt "electric shock "type pains, and continues to experience them approximately 4-5 times per week. They usually last seconds, and are sometimes associated with a deep chest pressure. She experiences this at rest or when she goes swimming. She says nitroglycerin has never relieved this pain. She has a history of syncopal episodes as well, most recently in 04/2013. She does not get any exercise due to severe back discomfort. She denies leg swelling but complains of neuropathic pain. She seldom experiences palpitations. She denies orthopnea and PND.    Allergies  Allergen Reactions  . Ciprofloxacin Itching and Nausea And Vomiting  . Sulfonamide Derivatives Hives  . Bee Venom Swelling and Rash     Current Outpatient Prescriptions  Medication Sig Dispense Refill  . aspirin 81 MG tablet Take 81 mg by mouth daily.        . cetirizine (ZYRTEC) 10 MG tablet Take 10 mg by mouth daily.      . clopidogrel (PLAVIX) 75 MG tablet Take 75 mg by mouth daily.      . DULoxetine (CYMBALTA) 60 MG capsule Take 60 mg by mouth 2 (two) times daily.       Marland Kitchen esomeprazole (NEXIUM) 40 MG capsule Take 40 mg by mouth daily before breakfast.      . HYDROcodone-acetaminophen (NORCO) 7.5-325 MG per tablet Take 1 tablet by mouth every 6 (six) hours as needed for pain.      Marland Kitchen levothyroxine (SYNTHROID, LEVOTHROID) 75 MCG tablet Take 75 mcg by mouth daily.        . magnesium oxide (MAG-OX) 400 MG tablet Take 400 mg by mouth daily.      . metformin (FORTAMET) 500 MG (OSM) 24 hr tablet Take 1,000 mg by mouth 2 (two) times daily before a meal.       . metoprolol (TOPROL-XL) 100 MG 24 hr tablet Take 150 mg by mouth daily.       . nitroGLYCERIN (NITROSTAT) 0.4 MG SL tablet Place 0.4 mg under the tongue every 5 (five) minutes as needed for chest pain.      Marland Kitchen oxybutynin (DITROPAN) 5 MG tablet Take 5 mg by mouth daily.      . potassium chloride (K-DUR,KLOR-CON) 10 MEQ tablet Take 10 mEq by mouth daily.       . pregabalin (  LYRICA) 100 MG capsule Take 100 mg by mouth 3 (three) times daily.      Marland Kitchen albuterol (PROVENTIL HFA;VENTOLIN HFA) 108 (90 BASE) MCG/ACT inhaler Inhale 2 puffs into the lungs every 6 (six) hours as needed for wheezing.       . Fluticasone-Salmeterol (ADVAIR) 250-50 MCG/DOSE AEPB Inhale 1 puff into the lungs every 12 (twelve) hours.      . insulin glargine (LANTUS) 100 UNIT/ML injection Inject 20 Units into the skin at bedtime.      . lidocaine (LIDODERM) 5 % Place 1 patch onto the skin as needed. Remove & Discard patch within 12 hours or as directed by MD      . losartan (COZAAR) 50 MG tablet Take 50 mg by mouth daily.        Marland Kitchen NEXIUM 40 MG capsule TAKE ONE CAPSULE BY MOUTH TWICE DAILY  60 capsule  5  .  polyethylene glycol-electrolytes (NULYTELY/GOLYTELY) 420 G solution Take 4,000 mLs by mouth once.  4000 mL  0  . rOPINIRole (REQUIP) 1 MG tablet Take 1 mg by mouth at bedtime.      . Ticagrelor (BRILINTA) 90 MG TABS tablet Take 90 mg by mouth 2 (two) times daily.        No current facility-administered medications for this visit.    Past Medical History  Diagnosis Date  . Rectal bleed 11/02/2010  . Epigastric abdominal pain 08/19/2010  . Sciatica   . Back pain, chronic   . Diabetes mellitus   . Hypertension   . Asthma     Past Surgical History  Procedure Laterality Date  . Colonoscopy w/ biopsies  12/24/2010    NUR  . Colonoscopy  11/30/2010  . Abdominal hysterectomy    . Cholecystectomy    . Esophagogastroduodenoscopy  03/22/2012    Procedure: ESOPHAGOGASTRODUODENOSCOPY (EGD);  Surgeon: Rogene Houston, MD;  Location: [facility];  Service: Endoscopy;  Laterality: N/A;  300  . Coronary angioplasty with stent placement      March of 2014  . Colonoscopy, esophagogastroduodenoscopy (egd) and esophageal dilation N/A 04/05/2013    Procedure: COLONOSCOPY, ESOPHAGOGASTRODUODENOSCOPY (EGD) AND ESOPHAGEAL DILATION;  Surgeon: Rogene Houston, MD;  Location: [facility];  Service: Endoscopy;  Laterality: N/A;    History   Social History  . Marital Status: Divorced    Spouse Name: N/A    Number of Children: N/A  . Years of Education: N/A   Occupational History  . Not on file.   Social History Main Topics  . Smoking status: Former Smoker -- 0.50 packs/day for 20 years    Types: Cigarettes    Start date: 08/16/1977    Quit date: 09/21/2011  . Smokeless tobacco: Never Used     Comment: Quit 35yr ago after smoking 12 yrs  . Alcohol Use: No     Comment: rare  . Drug Use: No  . Sexual Activity: Not on file   Other Topics Concern  . Not on file   Social History Narrative  . No narrative on file     No family history of premature CAD in 1st degree relatives.  Prior to  Admission medications   Medication Sig Start Date End Date Taking? Authorizing Provider  aspirin 81 MG tablet Take 81 mg by mouth daily.     Yes Historical Provider, MD  cetirizine (ZYRTEC) 10 MG tablet Take 10 mg by mouth daily.   Yes Historical Provider, MD  clopidogrel (PLAVIX) 75 MG tablet Take 75  mg by mouth daily. 03/09/13 03/09/14 Yes Historical Provider, MD  DULoxetine (CYMBALTA) 60 MG capsule Take 60 mg by mouth 2 (two) times daily.    Yes Historical Provider, MD  esomeprazole (NEXIUM) 40 MG capsule Take 40 mg by mouth daily before breakfast.   Yes Historical Provider, MD  HYDROcodone-acetaminophen (NORCO) 7.5-325 MG per tablet Take 1 tablet by mouth every 6 (six) hours as needed for pain.   Yes Historical Provider, MD  levothyroxine (SYNTHROID, LEVOTHROID) 75 MCG tablet Take 75 mcg by mouth daily.     Yes Historical Provider, MD  magnesium oxide (MAG-OX) 400 MG tablet Take 400 mg by mouth daily.   Yes Historical Provider, MD  metformin (FORTAMET) 500 MG (OSM) 24 hr tablet Take 1,000 mg by mouth 2 (two) times daily before a meal.    Yes Historical Provider, MD  metoprolol (TOPROL-XL) 100 MG 24 hr tablet Take 150 mg by mouth daily.    Yes Historical Provider, MD  nitroGLYCERIN (NITROSTAT) 0.4 MG SL tablet Place 0.4 mg under the tongue every 5 (five) minutes as needed for chest pain.   Yes Historical Provider, MD  oxybutynin (DITROPAN) 5 MG tablet Take 5 mg by mouth daily.   Yes Historical Provider, MD  potassium chloride (K-DUR,KLOR-CON) 10 MEQ tablet Take 10 mEq by mouth daily.    Yes Historical Provider, MD  pregabalin (LYRICA) 100 MG capsule Take 100 mg by mouth 3 (three) times daily.   Yes Historical Provider, MD  albuterol (PROVENTIL HFA;VENTOLIN HFA) 108 (90 BASE) MCG/ACT inhaler Inhale 2 puffs into the lungs every 6 (six) hours as needed for wheezing.     Historical Provider, MD  Fluticasone-Salmeterol (ADVAIR) 250-50 MCG/DOSE AEPB Inhale 1 puff into the lungs every 12 (twelve) hours.     Historical Provider, MD  insulin glargine (LANTUS) 100 UNIT/ML injection Inject 20 Units into the skin at bedtime.    Historical Provider, MD  lidocaine (LIDODERM) 5 % Place 1 patch onto the skin as needed. Remove & Discard patch within 12 hours or as directed by MD    Historical Provider, MD  losartan (COZAAR) 50 MG tablet Take 50 mg by mouth daily.      Historical Provider, MD  NEXIUM 40 MG capsule TAKE ONE CAPSULE BY MOUTH TWICE DAILY 07/02/13   Rogene Houston, MD  polyethylene glycol-electrolytes (NULYTELY/GOLYTELY) 420 G solution Take 4,000 mLs by mouth once. 03/13/13   [name], NP  rOPINIRole (REQUIP) 1 MG tablet Take 1 mg by mouth at bedtime.    Historical Provider, MD  Ticagrelor (BRILINTA) 90 MG TABS tablet Take 90 mg by mouth 2 (two) times daily.     Historical Provider, MD     Review of systems complete and found to be negative unless listed above in HPI   BP 79/46, 124/88  Pulse 71, 69    Physical exam Height 5\' 9"  (1.753 m), weight 279 lb (126.554 kg). General: NAD, obese Neck: No JVD, no thyromegaly or thyroid nodule.  Lungs: Clear to auscultation bilaterally with normal respiratory effort. CV: Nondisplaced PMI.  Heart regular S1/S2, no S3/S4, no murmur.  No peripheral edema.  No carotid bruit.  Normal pedal pulses.  Abdomen: Soft, nontender, no hepatosplenomegaly, no distention.  Skin: Intact without lesions or rashes.  Neurologic: Alert and oriented x 3.  Psych: Normal affect. Extremities: No clubbing or cyanosis.  HEENT: Normal.   Labs:   Lab Results  Component Value Date   WBC 8.0 03/13/2013   HGB  14.2 03/13/2013   HCT 42.3 03/13/2013   MCV 86.5 03/13/2013   PLT 292 03/13/2013   No results found for this basename: NA, K, CL, CO2, BUN, CREATININE, CALCIUM, LABALBU, PROT, BILITOT, ALKPHOS, ALT, AST, GLUCOSE,  in the last 168 hours No results found for this basename: CKTOTAL, CKMB, CKMBINDEX, TROPONINI    No results found for this basename: CHOL   No  results found for this basename: HDL   No results found for this basename: LDLCALC   No results found for this basename: TRIG   No results found for this basename: CHOLHDL   No results found for this basename: LDLDIRECT         Studies: No results found.  ASSESSMENT AND PLAN:  1. CAD with RCA stent: I will continue aspirin, but Plavix can now be discontinued as it has been over 1 year since stent placement. Her chest pain appears to be atypical for a cardiac etiology in that it is not relieved with nitroglycerin and did not necessarily improve after stent placement. This may be related to fibromyalgia but could also be secondary to microvascular dysfunction given her history of diabetes. Continue metoprolol succinate at current dose. I will obtain an echocardiogram to establish baseline left ventricular systolic function. She is not able to tolerate statin therapy reportedly due to liver dysfunction. I will obtain a lipid panel. 2. HTN: Controlled on current therapy which includes benazepril and metoprolol. 3. Hyperlipidemia: She is not able to tolerate statin therapy reportedly due to liver dysfunction. I will obtain a lipid panel. 4. Insulin-dependent diabetes mellitus: I will obtain an HbA1c. I will make a referral to Dr. Dorris Fetch for diabetes management. 5. Hypothyroidism: The patient says her TSH was checked 3 months ago and was normal. She has been experiencing had loss. A referral to endocrinology will be made.  Dispo: f/u 3 months.  Signed: Kate Sable, M.D., F.A.C.C.  09/20/2013, 9:22 AM

## 2013-09-24 ENCOUNTER — Other Ambulatory Visit: Payer: Self-pay | Admitting: Cardiovascular Disease

## 2013-09-24 LAB — LIPID PANEL
CHOLESTEROL: 223 mg/dL — AB (ref 0–200)
HDL: 50 mg/dL (ref >39)
LDL Cholesterol: 146 mg/dL — ABNORMAL HIGH (ref 0–99)
Total CHOL/HDL Ratio: 4.5 Ratio
Triglycerides: 137 mg/dL (ref <150)
VLDL: 27 mg/dL (ref 0–40)

## 2013-09-24 LAB — HEMOGLOBIN A1C
HEMOGLOBIN A1C: 6.4 % — AB (ref <5.7)
MEAN PLASMA GLUCOSE: 137 mg/dL — AB (ref <117)

## 2013-09-26 ENCOUNTER — Other Ambulatory Visit (HOSPITAL_COMMUNITY): Payer: Self-pay

## 2013-09-26 DIAGNOSIS — G4730 Sleep apnea, unspecified: Secondary | ICD-10-CM

## 2013-10-01 ENCOUNTER — Telehealth: Payer: Self-pay | Admitting: *Deleted

## 2013-10-01 MED ORDER — EZETIMIBE 10 MG PO TABS: 10.0000 mg | ORAL_TABLET | Freq: Every day | ORAL | Status: DC

## 2013-10-01 NOTE — Telephone Encounter (Signed)
Message copied by Laurine Blazer on Mon Oct 01, 2013  3:03 PM ------      Message from: Kate Sable A      Created: Wed Sep 26, 2013  1:43 PM       Elevated. Would recommend starting Zetia 10 mg daily and repeating lipids in 3 months. ------

## 2013-10-01 NOTE — Telephone Encounter (Signed)
Notes Recorded by Laurine Blazer, LPN on 5/97/4163 at 8:45 PM Patient notified and verbalized understanding. Will send new rx to [facility]. Will mail lab order when time to repeat labs.

## 2013-10-01 NOTE — Telephone Encounter (Signed)
Notes Recorded by Laurine Blazer, LPN on 8/34/1961 at 2:29 PM Left message to return call.

## 2013-10-02 ENCOUNTER — Encounter: Payer: Self-pay | Admitting: Neurology

## 2013-10-02 ENCOUNTER — Ambulatory Visit: Payer: Medicaid Other | Attending: Neurology | Admitting: Sleep Medicine

## 2013-10-02 DIAGNOSIS — Z6839 Body mass index (BMI) 39.0-39.9, adult: Secondary | ICD-10-CM | POA: Insufficient documentation

## 2013-10-02 DIAGNOSIS — G4733 Obstructive sleep apnea (adult) (pediatric): Secondary | ICD-10-CM | POA: Insufficient documentation

## 2013-10-02 DIAGNOSIS — G4730 Sleep apnea, unspecified: Secondary | ICD-10-CM

## 2013-10-03 ENCOUNTER — Other Ambulatory Visit: Payer: Self-pay

## 2013-10-03 ENCOUNTER — Other Ambulatory Visit (INDEPENDENT_AMBULATORY_CARE_PROVIDER_SITE_OTHER): Payer: Medicaid Other

## 2013-10-03 DIAGNOSIS — I2510 Atherosclerotic heart disease of native coronary artery without angina pectoris: Secondary | ICD-10-CM

## 2013-10-03 DIAGNOSIS — R0602 Shortness of breath: Secondary | ICD-10-CM

## 2013-10-03 DIAGNOSIS — R079 Chest pain, unspecified: Secondary | ICD-10-CM

## 2013-10-04 ENCOUNTER — Encounter: Payer: Self-pay | Admitting: *Deleted

## 2013-10-10 NOTE — Sleep Study (Signed)
[place] A. Merlene Laughter, MD     [email or web address]          LOCATION: SLEEP LAB FACILITY: [facility]  PHYSICIAN: Darian Ace A. Merlene Laughter, M.D.   DATE OF STUDY: 10/02/2013.  NOCTURNAL POLYSOMNOGRAM   REFERRING PHYSICIAN: Nathaneal Sommers.  INDICATIONS: This is a 53 year old presents with witnessed apnea, snoring and difficulty breathing along with insomnia.  MEDICATIONS:  Prior to Admission medications   Medication Sig Start Date End Date Taking? Authorizing Provider  albuterol (PROAIR HFA) 108 (90 BASE) MCG/ACT inhaler Inhale 2 puffs into the lungs every 6 (six) hours as needed for wheezing or shortness of breath.    Historical Provider, MD  aspirin 81 MG tablet Take 81 mg by mouth daily.      Historical Provider, MD  benazepril (LOTENSIN) 20 MG tablet Take 20 mg by mouth daily.    Historical Provider, MD  cetirizine (ZYRTEC) 10 MG tablet Take 10 mg by mouth daily.    Historical Provider, MD  clopidogrel (PLAVIX) 75 MG tablet Take 75 mg by mouth daily. 03/09/13 03/09/14  Historical Provider, MD  DULoxetine (CYMBALTA) 60 MG capsule Take 60 mg by mouth 2 (two) times daily.     Historical Provider, MD  esomeprazole (NEXIUM) 40 MG capsule Take 40 mg by mouth daily before breakfast.    Historical Provider, MD  ezetimibe (ZETIA) 10 MG tablet Take 1 tablet (10 mg total) by mouth daily. 10/01/13   Herminio Commons, MD  HYDROcodone-acetaminophen (NORCO) 7.5-325 MG per tablet Take 1 tablet by mouth every 6 (six) hours as needed for pain.    Historical Provider, MD  insulin aspart (NOVOLOG) 100 UNIT/ML FlexPen Inject into the skin as directed.    Historical Provider, MD  insulin glargine (LANTUS) 100 UNIT/ML injection Inject 20 Units into the skin at bedtime.    Historical Provider, MD  levothyroxine (SYNTHROID, LEVOTHROID) 75 MCG tablet Take 75 mcg by mouth daily.      Historical Provider, MD  lidocaine (LIDODERM) 5 % Place 1 patch onto the skin as needed. Remove & Discard patch within 12  hours or as directed by MD    Historical Provider, MD  magnesium oxide (MAG-OX) 400 MG tablet Take 400 mg by mouth daily.    Historical Provider, MD  metformin (FORTAMET) 500 MG (OSM) 24 hr tablet Take 1,000 mg by mouth 2 (two) times daily before a meal.     Historical Provider, MD  metoprolol (TOPROL-XL) 100 MG 24 hr tablet Take 150 mg by mouth daily.     Historical Provider, MD  nitroGLYCERIN (NITROSTAT) 0.4 MG SL tablet Place 0.4 mg under the tongue every 5 (five) minutes as needed for chest pain.    Historical Provider, MD  oxybutynin (DITROPAN) 5 MG tablet Take 5 mg by mouth daily.    Historical Provider, MD  potassium chloride (K-DUR,KLOR-CON) 10 MEQ tablet Take 10 mEq by mouth daily.     Historical Provider, MD  pregabalin (LYRICA) 100 MG capsule Take 100 mg by mouth 3 (three) times daily.    Historical Provider, MD  ranitidine (ZANTAC) 75 MG tablet Take 75 mg by mouth at bedtime.    Historical Provider, MD  rOPINIRole (REQUIP) 1 MG tablet Take 1 mg by mouth at bedtime.    Historical Provider, MD      EPWORTH SLEEPINESS SCALE: 18.   BMI: 39.   ARCHITECTURAL SUMMARY: Total recording time was 397 minutes. Sleep efficiency 75 %. Sleep latency 80 minutes. REM latency 56 minutes. Stage NI  18 %, N2 79 % and N3 to % and REM sleep 11 %. The sleep architecture appears somewhat fragmented.   RESPIRATORY DATA:  Baseline oxygen saturation is 98 %. The lowest saturation is 92 %. The diagnostic AHI is 13. The RDI is 19. The REM AHI is 11.  LIMB MOVEMENT SUMMARY: PLM index 0.   ELECTROCARDIOGRAM SUMMARY: Average heart rate is 68 with no significant dysrhythmias observed.   IMPRESSION:  1. Mild-to-moderate obstructive sleep apnea syndrome. Formal CPAP titration recording is suggestive.  2. Abnormal sleep architecture with fragmented sleep, increased stage II sleep and reduced slow-wave sleep.  Thanks for this referral.  Willi [place] A. Merlene Laughter, M.D. Diplomat, Tax adviser of Sleep Medicine.

## 2013-12-17 ENCOUNTER — Telehealth: Payer: Self-pay | Admitting: Cardiovascular Disease

## 2013-12-17 NOTE — Telephone Encounter (Signed)
Patient needs refill on Zetia 10mg . Please send to [place], [place]

## 2013-12-17 NOTE — Telephone Encounter (Signed)
Patient informed that this med had already been sent to pharmacy in April.  #30 w/ 6 refills.  Should do her till October.  Advised her to request refill from pharmacy & if have any future problems to have pharm contact us.  Patient verbalized understanding.

## 2013-12-17 NOTE — Telephone Encounter (Signed)
Left message to return call 

## 2014-01-03 ENCOUNTER — Other Ambulatory Visit (HOSPITAL_COMMUNITY): Payer: Self-pay

## 2014-01-03 DIAGNOSIS — G4730 Sleep apnea, unspecified: Secondary | ICD-10-CM

## 2014-01-21 ENCOUNTER — Ambulatory Visit: Payer: Medicaid Other | Attending: Neurology | Admitting: Sleep Medicine

## 2014-01-21 VITALS — Ht 69.0 in | Wt 293.0 lb

## 2014-01-21 DIAGNOSIS — G4733 Obstructive sleep apnea (adult) (pediatric): Secondary | ICD-10-CM | POA: Insufficient documentation

## 2014-01-21 DIAGNOSIS — G4730 Sleep apnea, unspecified: Secondary | ICD-10-CM

## 2014-01-25 ENCOUNTER — Telehealth: Payer: Self-pay | Admitting: *Deleted

## 2014-01-25 DIAGNOSIS — R7989 Other specified abnormal findings of blood chemistry: Secondary | ICD-10-CM

## 2014-01-25 DIAGNOSIS — E785 Hyperlipidemia, unspecified: Secondary | ICD-10-CM

## 2014-01-25 DIAGNOSIS — R945 Abnormal results of liver function studies: Secondary | ICD-10-CM

## 2014-01-25 NOTE — Telephone Encounter (Signed)
Patient due for follow up Lipids.  Notified patient.  Will fax lab order to [facility] & she will do in the next few weeks.  Reminded to go fasting.

## 2014-01-30 NOTE — Sleep Study (Signed)
Amber A. Merlene Laughter, MD     [email or web address]        NOCTURNAL POLYSOMNOGRAM    LOCATION: SLEEP LAB FACILITY: [place]   PHYSICIAN: Adalene Gulotta A. Merlene Fischer, M.D.   DATE OF STUDY: 01/21/2014.   REFERRING PHYSICIAN: Adreonna Yontz.  INDICATIONS: This is a 53 year old who had a sleep study documenting obstructive sleep apnea syndrome. This is a CPAP titration recording.  MEDICATIONS:  Prior to Admission medications   Medication Sig Start Date End Date Taking? Authorizing Provider  albuterol (PROAIR HFA) 108 (90 BASE) MCG/ACT inhaler Inhale 2 puffs into the lungs every 6 (six) hours as needed for wheezing or shortness of breath.    Historical Provider, MD  aspirin 81 MG tablet Take 81 mg by mouth daily.      Historical Provider, MD  benazepril (LOTENSIN) 20 MG tablet Take 20 mg by mouth daily.    Historical Provider, MD  cetirizine (ZYRTEC) 10 MG tablet Take 10 mg by mouth daily.    Historical Provider, MD  clopidogrel (PLAVIX) 75 MG tablet Take 75 mg by mouth daily. 03/09/13 03/09/14  Historical Provider, MD  DULoxetine (CYMBALTA) 60 MG capsule Take 60 mg by mouth 2 (two) times daily.     Historical Provider, MD  esomeprazole (NEXIUM) 40 MG capsule Take 40 mg by mouth daily before breakfast.    Historical Provider, MD  ezetimibe (ZETIA) 10 MG tablet Take 1 tablet (10 mg total) by mouth daily. 10/01/13   Herminio Commons, MD  HYDROcodone-acetaminophen (NORCO) 7.5-325 MG per tablet Take 1 tablet by mouth every 6 (six) hours as needed for pain.    Historical Provider, MD  insulin aspart (NOVOLOG) 100 UNIT/ML FlexPen Inject into the skin as directed.    Historical Provider, MD  insulin glargine (LANTUS) 100 UNIT/ML injection Inject 20 Units into the skin at bedtime.    Historical Provider, MD  levothyroxine (SYNTHROID, LEVOTHROID) 75 MCG tablet Take 75 mcg by mouth daily.      Historical Provider, MD  lidocaine (LIDODERM) 5 % Place 1 patch onto the skin as needed. Remove &  Discard patch within 12 hours or as directed by MD    Historical Provider, MD  magnesium oxide (MAG-OX) 400 MG tablet Take 400 mg by mouth daily.    Historical Provider, MD  metformin (FORTAMET) 500 MG (OSM) 24 hr tablet Take 1,000 mg by mouth 2 (two) times daily before a meal.     Historical Provider, MD  metoprolol (TOPROL-XL) 100 MG 24 hr tablet Take 150 mg by mouth daily.     Historical Provider, MD  nitroGLYCERIN (NITROSTAT) 0.4 MG SL tablet Place 0.4 mg under the tongue every 5 (five) minutes as needed for chest pain.    Historical Provider, MD  oxybutynin (DITROPAN) 5 MG tablet Take 5 mg by mouth daily.    Historical Provider, MD  potassium chloride (K-DUR,KLOR-CON) 10 MEQ tablet Take 10 mEq by mouth daily.     Historical Provider, MD  pregabalin (LYRICA) 100 MG capsule Take 100 mg by mouth 3 (three) times daily.    Historical Provider, MD  ranitidine (ZANTAC) 75 MG tablet Take 75 mg by mouth at bedtime.    Historical Provider, MD  rOPINIRole (REQUIP) 1 MG tablet Take 1 mg by mouth at bedtime.    Historical Provider, MD      EPWORTH SLEEPINESS SCALE: 14.   BMI: 36.   ARCHITECTURAL SUMMARY: Total recording time was 399 minutes. Sleep efficiency 90 %. Sleep latency 25  minutes. REM latency 88 minutes. Stage NI 4 %, N2 53 % and N3 23 % and REM sleep 20 %.    RESPIRATORY DATA:  Baseline oxygen saturation is 95 %. The lowest saturation is 90 %. The patient was placed on positive pressure starting at 5 and increase 8. Optimal pressure is 8 with resolution of obstructive events and good tolerance.  LIMB MOVEMENT SUMMARY: PLM index 0.   ELECTROCARDIOGRAM SUMMARY: Average heart rate is 59 with no significant dysrhythmias observed.   IMPRESSION:  1. Obstructive sleep apnea syndrome which responds well to the CPAP of 8.  Thanks for this referral.  Amber Fischer, M.D. Diplomat, Tax adviser of Sleep Medicine.

## 2014-02-07 ENCOUNTER — Telehealth: Payer: Self-pay | Admitting: *Deleted

## 2014-02-07 NOTE — Telephone Encounter (Signed)
Message copied by Laurine Blazer on Thu Feb 07, 2014  1:43 PM ------      Message from: Kate Sable A      Created: Mon Feb 04, 2014  4:32 PM       Markedly elevated LDL. Would start Zetia 10 mg daily and repeat lipids/LFT's in 3 months. Need to find out details as to why she was not able to tolerate statin therapy. Truly liver dysfunction? If so, how bad? ------

## 2014-02-07 NOTE — Telephone Encounter (Signed)
Notes Recorded by Laurine Blazer, LPN on 2/33/6122 at 4:49 PM Patient notified. Stated that Dr. Sherrie Sport had put her on Zetia already. Stated PMD had tried her on Pravastatin in June and she could not tolerate due to leg/arm swelling & red bumps all over. Patient needs 3 mo follow up scheduled. Will send note to schedulers as she has [employer] & will need approval from PMD first.

## 2014-02-27 ENCOUNTER — Ambulatory Visit: Payer: Medicaid Other | Admitting: Cardiovascular Disease

## 2014-03-18 ENCOUNTER — Inpatient Hospital Stay (HOSPITAL_COMMUNITY)
Admission: EM | Admit: 2014-03-18 | Discharge: 2014-03-20 | DRG: 313 | Disposition: A | Discharge status: Home / Self Care | Payer: Medicaid Other | Attending: Internal Medicine | Admitting: Internal Medicine

## 2014-03-18 ENCOUNTER — Emergency Department (HOSPITAL_COMMUNITY): Payer: Medicaid Other

## 2014-03-18 ENCOUNTER — Encounter (HOSPITAL_COMMUNITY): Payer: Self-pay | Admitting: Emergency Medicine

## 2014-03-18 DIAGNOSIS — I10 Essential (primary) hypertension: Secondary | ICD-10-CM

## 2014-03-18 DIAGNOSIS — IMO0001 Reserved for inherently not codable concepts without codable children: Secondary | ICD-10-CM | POA: Diagnosis present

## 2014-03-18 DIAGNOSIS — F17200 Nicotine dependence, unspecified, uncomplicated: Secondary | ICD-10-CM

## 2014-03-18 DIAGNOSIS — R072 Precordial pain: Principal | ICD-10-CM

## 2014-03-18 DIAGNOSIS — M549 Dorsalgia, unspecified: Secondary | ICD-10-CM | POA: Diagnosis present

## 2014-03-18 DIAGNOSIS — Z9861 Coronary angioplasty status: Secondary | ICD-10-CM

## 2014-03-18 DIAGNOSIS — Z87891 Personal history of nicotine dependence: Secondary | ICD-10-CM

## 2014-03-18 DIAGNOSIS — I209 Angina pectoris, unspecified: Secondary | ICD-10-CM | POA: Diagnosis present

## 2014-03-18 DIAGNOSIS — J449 Chronic obstructive pulmonary disease, unspecified: Secondary | ICD-10-CM | POA: Diagnosis present

## 2014-03-18 DIAGNOSIS — I2510 Atherosclerotic heart disease of native coronary artery without angina pectoris: Secondary | ICD-10-CM | POA: Diagnosis present

## 2014-03-18 DIAGNOSIS — F429 Obsessive-compulsive disorder, unspecified: Secondary | ICD-10-CM | POA: Diagnosis present

## 2014-03-18 DIAGNOSIS — E1149 Type 2 diabetes mellitus with other diabetic neurological complication: Secondary | ICD-10-CM | POA: Diagnosis present

## 2014-03-18 DIAGNOSIS — K219 Gastro-esophageal reflux disease without esophagitis: Secondary | ICD-10-CM

## 2014-03-18 DIAGNOSIS — R079 Chest pain, unspecified: Secondary | ICD-10-CM

## 2014-03-18 DIAGNOSIS — I25119 Atherosclerotic heart disease of native coronary artery with unspecified angina pectoris: Secondary | ICD-10-CM

## 2014-03-18 DIAGNOSIS — E1142 Type 2 diabetes mellitus with diabetic polyneuropathy: Secondary | ICD-10-CM | POA: Diagnosis present

## 2014-03-18 DIAGNOSIS — G8929 Other chronic pain: Secondary | ICD-10-CM | POA: Diagnosis present

## 2014-03-18 DIAGNOSIS — J4489 Other specified chronic obstructive pulmonary disease: Secondary | ICD-10-CM | POA: Diagnosis present

## 2014-03-18 DIAGNOSIS — E785 Hyperlipidemia, unspecified: Secondary | ICD-10-CM

## 2014-03-18 DIAGNOSIS — R1310 Dysphagia, unspecified: Secondary | ICD-10-CM | POA: Diagnosis present

## 2014-03-18 DIAGNOSIS — I25111 Atherosclerotic heart disease of native coronary artery with angina pectoris with documented spasm: Secondary | ICD-10-CM

## 2014-03-18 HISTORY — DX: Chronic obstructive pulmonary disease, unspecified: J44.9

## 2014-03-18 HISTORY — DX: Essential (primary) hypertension: I10

## 2014-03-18 HISTORY — DX: Fibromyalgia: M79.7

## 2014-03-18 HISTORY — DX: Obsessive-compulsive disorder, unspecified: F42.9

## 2014-03-18 HISTORY — DX: Atherosclerotic heart disease of native coronary artery without angina pectoris: I25.10

## 2014-03-18 HISTORY — DX: Type 2 diabetes mellitus without complications: E11.9

## 2014-03-18 LAB — BASIC METABOLIC PANEL
Anion gap: 14 (ref 5–15)
BUN: 20 mg/dL (ref 6–23)
CALCIUM: 9.4 mg/dL (ref 8.4–10.5)
CO2: 25 mEq/L (ref 19–32)
CREATININE: 1.2 mg/dL — AB (ref 0.50–1.10)
Chloride: 101 mEq/L (ref 96–112)
GFR calc Af Amer: 59 mL/min — ABNORMAL LOW (ref >90)
GFR calc non Af Amer: 51 mL/min — ABNORMAL LOW (ref >90)
GLUCOSE: 124 mg/dL — AB (ref 70–99)
Potassium: 4.3 mEq/L (ref 3.7–5.3)
Sodium: 140 mEq/L (ref 137–147)

## 2014-03-18 LAB — CBC
HCT: 38.3 % (ref 36.0–46.0)
HEMOGLOBIN: 12.7 g/dL (ref 12.0–15.0)
MCH: 29.1 pg (ref 26.0–34.0)
MCHC: 33.2 g/dL (ref 30.0–36.0)
MCV: 87.8 fL (ref 78.0–100.0)
Platelets: 184 10*3/uL (ref 150–400)
RBC: 4.36 MIL/uL (ref 3.87–5.11)
RDW: 13.9 % (ref 11.5–15.5)
WBC: 7.9 10*3/uL (ref 4.0–10.5)

## 2014-03-18 LAB — TROPONIN I: Troponin I: <0.3 ng/mL (ref <0.30)

## 2014-03-18 MED ORDER — ASPIRIN 325 MG PO TABS
325.0000 mg | ORAL_TABLET | ORAL | Status: AC
  Administered 2014-03-18: 325 mg via ORAL
  Filled 2014-03-18: qty 1

## 2014-03-18 MED ORDER — NITROGLYCERIN 0.4 MG SL SUBL
0.4000 mg | SUBLINGUAL_TABLET | SUBLINGUAL | Status: DC | PRN
  Administered 2014-03-18 (×3): 0.4 mg via SUBLINGUAL
  Filled 2014-03-18 (×2): qty 1

## 2014-03-18 NOTE — ED Notes (Signed)
Pt ith c/o chest pain that started after "supper" tonight. Pt has angina and takes NTG. Pt took 1 NTG when pain started and it was not relieved. Pt did not take any more.

## 2014-03-19 ENCOUNTER — Encounter (HOSPITAL_COMMUNITY): Payer: Self-pay | Admitting: Cardiology

## 2014-03-19 ENCOUNTER — Inpatient Hospital Stay (HOSPITAL_COMMUNITY): Payer: Medicaid Other

## 2014-03-19 ENCOUNTER — Ambulatory Visit: Payer: Medicaid Other | Admitting: Cardiovascular Disease

## 2014-03-19 DIAGNOSIS — I209 Angina pectoris, unspecified: Secondary | ICD-10-CM

## 2014-03-19 DIAGNOSIS — F429 Obsessive-compulsive disorder, unspecified: Secondary | ICD-10-CM | POA: Diagnosis present

## 2014-03-19 DIAGNOSIS — E785 Hyperlipidemia, unspecified: Secondary | ICD-10-CM | POA: Diagnosis not present

## 2014-03-19 DIAGNOSIS — G8929 Other chronic pain: Secondary | ICD-10-CM | POA: Diagnosis present

## 2014-03-19 DIAGNOSIS — I10 Essential (primary) hypertension: Secondary | ICD-10-CM

## 2014-03-19 DIAGNOSIS — K219 Gastro-esophageal reflux disease without esophagitis: Secondary | ICD-10-CM

## 2014-03-19 DIAGNOSIS — E1149 Type 2 diabetes mellitus with other diabetic neurological complication: Secondary | ICD-10-CM | POA: Diagnosis present

## 2014-03-19 DIAGNOSIS — R1310 Dysphagia, unspecified: Secondary | ICD-10-CM | POA: Diagnosis present

## 2014-03-19 DIAGNOSIS — J449 Chronic obstructive pulmonary disease, unspecified: Secondary | ICD-10-CM | POA: Diagnosis present

## 2014-03-19 DIAGNOSIS — IMO0001 Reserved for inherently not codable concepts without codable children: Secondary | ICD-10-CM | POA: Diagnosis present

## 2014-03-19 DIAGNOSIS — R072 Precordial pain: Principal | ICD-10-CM

## 2014-03-19 DIAGNOSIS — I2510 Atherosclerotic heart disease of native coronary artery without angina pectoris: Secondary | ICD-10-CM

## 2014-03-19 DIAGNOSIS — E1142 Type 2 diabetes mellitus with diabetic polyneuropathy: Secondary | ICD-10-CM | POA: Diagnosis present

## 2014-03-19 DIAGNOSIS — R079 Chest pain, unspecified: Secondary | ICD-10-CM | POA: Diagnosis present

## 2014-03-19 DIAGNOSIS — Z9861 Coronary angioplasty status: Secondary | ICD-10-CM | POA: Diagnosis not present

## 2014-03-19 DIAGNOSIS — M549 Dorsalgia, unspecified: Secondary | ICD-10-CM | POA: Diagnosis present

## 2014-03-19 DIAGNOSIS — Z87891 Personal history of nicotine dependence: Secondary | ICD-10-CM | POA: Diagnosis not present

## 2014-03-19 DIAGNOSIS — F17200 Nicotine dependence, unspecified, uncomplicated: Secondary | ICD-10-CM

## 2014-03-19 HISTORY — DX: Angina pectoris, unspecified: I20.9

## 2014-03-19 LAB — COMPREHENSIVE METABOLIC PANEL
ALBUMIN: 3.5 g/dL (ref 3.5–5.2)
ALT: 46 U/L — AB (ref 0–35)
AST: 34 U/L (ref 0–37)
Alkaline Phosphatase: 76 U/L (ref 39–117)
Anion gap: 12 (ref 5–15)
BILIRUBIN TOTAL: 0.4 mg/dL (ref 0.3–1.2)
BUN: 18 mg/dL (ref 6–23)
CHLORIDE: 101 meq/L (ref 96–112)
CO2: 26 mEq/L (ref 19–32)
Calcium: 9.2 mg/dL (ref 8.4–10.5)
Creatinine, Ser: 0.91 mg/dL (ref 0.50–1.10)
GFR calc Af Amer: 83 mL/min — ABNORMAL LOW (ref >90)
GFR calc non Af Amer: 71 mL/min — ABNORMAL LOW (ref >90)
Glucose, Bld: 143 mg/dL — ABNORMAL HIGH (ref 70–99)
POTASSIUM: 3.9 meq/L (ref 3.7–5.3)
SODIUM: 139 meq/L (ref 137–147)
Total Protein: 7 g/dL (ref 6.0–8.3)

## 2014-03-19 LAB — TROPONIN I
Troponin I: <0.3 ng/mL (ref <0.30)
Troponin I: <0.3 ng/mL (ref <0.30)
Troponin I: <0.3 ng/mL (ref <0.30)

## 2014-03-19 LAB — CBC
HCT: 35.8 % — ABNORMAL LOW (ref 36.0–46.0)
Hemoglobin: 11.9 g/dL — ABNORMAL LOW (ref 12.0–15.0)
MCH: 29.1 pg (ref 26.0–34.0)
MCHC: 33.2 g/dL (ref 30.0–36.0)
MCV: 87.5 fL (ref 78.0–100.0)
PLATELETS: 165 10*3/uL (ref 150–400)
RBC: 4.09 MIL/uL (ref 3.87–5.11)
RDW: 14.1 % (ref 11.5–15.5)
WBC: 6.6 10*3/uL (ref 4.0–10.5)

## 2014-03-19 LAB — GLUCOSE, CAPILLARY
GLUCOSE-CAPILLARY: 149 mg/dL — AB (ref 70–99)
GLUCOSE-CAPILLARY: 165 mg/dL — AB (ref 70–99)
GLUCOSE-CAPILLARY: 150 mg/dL — AB (ref 70–99)
GLUCOSE-CAPILLARY: 113 mg/dL — AB (ref 70–99)
Glucose-Capillary: 170 mg/dL — ABNORMAL HIGH (ref 70–99)

## 2014-03-19 LAB — MRSA PCR SCREENING: MRSA BY PCR: NEGATIVE

## 2014-03-19 MED ORDER — SODIUM CHLORIDE 0.9 % IV SOLN
250.0000 mL | INTRAVENOUS | Status: DC | PRN
  Administered 2014-03-19: 250 mL via INTRAVENOUS

## 2014-03-19 MED ORDER — CHLORHEXIDINE GLUCONATE 0.12 % MT SOLN
15.0000 mL | Freq: Two times a day (BID) | OROMUCOSAL | Status: DC
  Administered 2014-03-19 (×2): 15 mL via OROMUCOSAL
  Filled 2014-03-19 (×2): qty 15

## 2014-03-19 MED ORDER — TECHNETIUM TC 99M SESTAMIBI GENERIC - CARDIOLITE
10.0000 | Freq: Once | INTRAVENOUS | Status: AC | PRN
  Administered 2014-03-19: 10 via INTRAVENOUS

## 2014-03-19 MED ORDER — SIMETHICONE 80 MG PO CHEW
160.0000 mg | CHEWABLE_TABLET | Freq: Four times a day (QID) | ORAL | Status: DC | PRN
  Administered 2014-03-19: 160 mg via ORAL
  Filled 2014-03-19: qty 2

## 2014-03-19 MED ORDER — CLOPIDOGREL BISULFATE 75 MG PO TABS
75.0000 mg | ORAL_TABLET | Freq: Every day | ORAL | Status: DC
  Administered 2014-03-19 – 2014-03-20 (×2): 75 mg via ORAL
  Filled 2014-03-19 (×2): qty 1

## 2014-03-19 MED ORDER — ENOXAPARIN SODIUM 60 MG/0.6ML ~~LOC~~ SOLN
60.0000 mg | SUBCUTANEOUS | Status: DC
  Administered 2014-03-19 – 2014-03-20 (×2): 60 mg via SUBCUTANEOUS
  Filled 2014-03-19 (×2): qty 0.6

## 2014-03-19 MED ORDER — POTASSIUM CHLORIDE CRYS ER 10 MEQ PO TBCR
10.0000 meq | EXTENDED_RELEASE_TABLET | Freq: Every day | ORAL | Status: DC
  Administered 2014-03-19 – 2014-03-20 (×2): 10 meq via ORAL
  Filled 2014-03-19 (×2): qty 1

## 2014-03-19 MED ORDER — CETYLPYRIDINIUM CHLORIDE 0.05 % MT LIQD: 7.0000 mL | Freq: Two times a day (BID) | OROMUCOSAL | Status: DC

## 2014-03-19 MED ORDER — ONDANSETRON HCL 4 MG/2ML IJ SOLN
4.0000 mg | Freq: Four times a day (QID) | INTRAMUSCULAR | Status: DC | PRN
  Administered 2014-03-20: 4 mg via INTRAVENOUS
  Filled 2014-03-19: qty 2

## 2014-03-19 MED ORDER — EZETIMIBE 10 MG PO TABS
10.0000 mg | ORAL_TABLET | Freq: Every day | ORAL | Status: DC
  Administered 2014-03-19 – 2014-03-20 (×2): 10 mg via ORAL
  Filled 2014-03-19 (×2): qty 1

## 2014-03-19 MED ORDER — SODIUM CHLORIDE 0.9 % IJ SOLN: 3.0000 mL | INTRAMUSCULAR | Status: DC | PRN

## 2014-03-19 MED ORDER — LORAZEPAM 0.5 MG PO TABS
0.5000 mg | ORAL_TABLET | Freq: Three times a day (TID) | ORAL | Status: DC | PRN
  Administered 2014-03-19: 0.5 mg via ORAL
  Filled 2014-03-19: qty 1

## 2014-03-19 MED ORDER — INSULIN ASPART 100 UNIT/ML ~~LOC~~ SOLN
0.0000 [IU] | Freq: Three times a day (TID) | SUBCUTANEOUS | Status: DC
  Administered 2014-03-19 (×2): 2 [IU] via SUBCUTANEOUS
  Administered 2014-03-19: 1 [IU] via SUBCUTANEOUS
  Administered 2014-03-20: 2 [IU] via SUBCUTANEOUS

## 2014-03-19 MED ORDER — INFLUENZA VAC SPLIT QUAD 0.5 ML IM SUSY
0.5000 mL | PREFILLED_SYRINGE | INTRAMUSCULAR | Status: AC
  Administered 2014-03-20: 0.5 mL via INTRAMUSCULAR
  Filled 2014-03-19: qty 0.5

## 2014-03-19 MED ORDER — LEVOTHYROXINE SODIUM 75 MCG PO TABS
75.0000 ug | ORAL_TABLET | Freq: Every day | ORAL | Status: DC
  Administered 2014-03-19 – 2014-03-20 (×2): 75 ug via ORAL
  Filled 2014-03-19 (×2): qty 1

## 2014-03-19 MED ORDER — REGADENOSON 0.4 MG/5ML IV SOLN
0.4000 mg | Freq: Once | INTRAVENOUS | Status: AC
  Administered 2014-03-20: 0.4 mg via INTRAVENOUS
  Filled 2014-03-19: qty 5

## 2014-03-19 MED ORDER — BENAZEPRIL HCL 10 MG PO TABS
10.0000 mg | ORAL_TABLET | Freq: Every day | ORAL | Status: DC
  Administered 2014-03-19 – 2014-03-20 (×2): 10 mg via ORAL
  Filled 2014-03-19 (×2): qty 1

## 2014-03-19 MED ORDER — PREGABALIN 25 MG PO CAPS
100.0000 mg | ORAL_CAPSULE | Freq: Three times a day (TID) | ORAL | Status: DC
  Administered 2014-03-19 – 2014-03-20 (×2): 100 mg via ORAL
  Filled 2014-03-19 (×2): qty 4

## 2014-03-19 MED ORDER — PREGABALIN 75 MG PO CAPS
100.0000 mg | ORAL_CAPSULE | Freq: Three times a day (TID) | ORAL | Status: DC
  Administered 2014-03-19 (×2): 100 mg via ORAL
  Filled 2014-03-19 (×4): qty 1

## 2014-03-19 MED ORDER — ASPIRIN EC 81 MG PO TBEC
81.0000 mg | DELAYED_RELEASE_TABLET | Freq: Every day | ORAL | Status: DC
  Administered 2014-03-19 – 2014-03-20 (×2): 81 mg via ORAL
  Filled 2014-03-19 (×2): qty 1

## 2014-03-19 MED ORDER — OXYBUTYNIN CHLORIDE 5 MG PO TABS
5.0000 mg | ORAL_TABLET | Freq: Every day | ORAL | Status: DC
  Administered 2014-03-19 – 2014-03-20 (×2): 5 mg via ORAL
  Filled 2014-03-19 (×2): qty 1

## 2014-03-19 MED ORDER — PANTOPRAZOLE SODIUM 40 MG PO TBEC
40.0000 mg | DELAYED_RELEASE_TABLET | Freq: Every day | ORAL | Status: DC
  Administered 2014-03-19 – 2014-03-20 (×2): 40 mg via ORAL
  Filled 2014-03-19 (×2): qty 1

## 2014-03-19 MED ORDER — METOPROLOL TARTRATE 50 MG PO TABS
50.0000 mg | ORAL_TABLET | Freq: Two times a day (BID) | ORAL | Status: DC
  Administered 2014-03-19 – 2014-03-20 (×2): 50 mg via ORAL
  Filled 2014-03-19 (×3): qty 1

## 2014-03-19 MED ORDER — ALBUTEROL SULFATE (2.5 MG/3ML) 0.083% IN NEBU: 3.0000 mL | INHALATION_SOLUTION | Freq: Four times a day (QID) | RESPIRATORY_TRACT | Status: DC | PRN

## 2014-03-19 MED ORDER — SODIUM CHLORIDE 0.9 % IV SOLN
Freq: Once | INTRAVENOUS | Status: AC
  Administered 2014-03-19: 1000 mL via INTRAVENOUS

## 2014-03-19 MED ORDER — LORATADINE 10 MG PO TABS
10.0000 mg | ORAL_TABLET | Freq: Every day | ORAL | Status: DC
  Administered 2014-03-19 – 2014-03-20 (×2): 10 mg via ORAL
  Filled 2014-03-19 (×2): qty 1

## 2014-03-19 MED ORDER — ROPINIROLE HCL 1 MG PO TABS
1.0000 mg | ORAL_TABLET | Freq: Every day | ORAL | Status: DC
  Administered 2014-03-19: 1 mg via ORAL
  Filled 2014-03-19 (×3): qty 1

## 2014-03-19 MED ORDER — SODIUM CHLORIDE 0.9 % IJ SOLN
3.0000 mL | Freq: Two times a day (BID) | INTRAMUSCULAR | Status: DC
  Administered 2014-03-19 – 2014-03-20 (×2): 3 mL via INTRAVENOUS

## 2014-03-19 MED ORDER — INSULIN GLARGINE 100 UNIT/ML ~~LOC~~ SOLN
20.0000 [IU] | Freq: Every day | SUBCUTANEOUS | Status: DC
  Administered 2014-03-19: 20 [IU] via SUBCUTANEOUS
  Filled 2014-03-19 (×2): qty 0.2

## 2014-03-19 MED ORDER — NITROGLYCERIN 2 % TD OINT
0.5000 [in_us] | TOPICAL_OINTMENT | Freq: Four times a day (QID) | TRANSDERMAL | Status: DC
  Administered 2014-03-19 – 2014-03-20 (×5): 0.5 [in_us] via TOPICAL
  Filled 2014-03-19 (×5): qty 1

## 2014-03-19 MED ORDER — MORPHINE SULFATE 2 MG/ML IJ SOLN
2.0000 mg | INTRAMUSCULAR | Status: DC | PRN
  Administered 2014-03-19: 2 mg via INTRAVENOUS
  Filled 2014-03-19: qty 1

## 2014-03-19 MED ORDER — NITROGLYCERIN 0.4 MG SL SUBL
0.4000 mg | SUBLINGUAL_TABLET | SUBLINGUAL | Status: AC | PRN
  Administered 2014-03-19 (×3): 0.4 mg via SUBLINGUAL

## 2014-03-19 MED ORDER — ONDANSETRON HCL 4 MG PO TABS: 4.0000 mg | ORAL_TABLET | Freq: Four times a day (QID) | ORAL | Status: DC | PRN

## 2014-03-19 MED ORDER — ENOXAPARIN SODIUM 40 MG/0.4ML ~~LOC~~ SOLN: 40.0000 mg | SUBCUTANEOUS | Status: DC

## 2014-03-19 NOTE — Progress Notes (Addendum)
Patient admitted to the hospital by Dr. Darrick Meigs earlier today  Patient seen and examined. Vitals are stable She does not appear to be in any distress S1, S2 RRR CTA B Soft, nt, nd, bs+ No edema b/l  She has been admitted with chest discomfort. She has history of CAD and DM as well as hypertension. She is being seen by cardiology and plans are for 2 day stress test. Will monitor on telemetry. Agree with H/P per Dr. Darrick Meigs from earlier today.  MEMON,JEHANZEB

## 2014-03-19 NOTE — H&P (Signed)
PCP:   HASANAJ,XAJE A, MD   Chief Complaint:  Chest pain  HPI:  53 year old female who  has a past medical history of Rectal bleed (11/02/2010); Epigastric abdominal pain (08/19/2010); Sciatica; Back pain, chronic; Diabetes mellitus; Hypertension; Asthma; Angina at rest; COPD (chronic obstructive pulmonary disease); Coronary artery disease; OCD (obsessive compulsive disorder); and Fibromyalgia. Today presents to the ED with chief comfortable chest pain started yesterday around 6 PM. The pain is substernal sharp radiates to back and right side of the neck. It was associated with the shortness of breath and nausea but no vomiting or diaphoresis. Patient took nitroglycerin home but found no relief so she came to ED. Patient was given aspirin and 2 sublingual nitroglycerin and got partial relief. Patient still continues to have mild pain. Patient has history of CAD, Her most recent cardiac catheterization was performed on 10/26/2012, which showed a patent RCA stent, 30% stenosis in the distal LAD, and 40% stenosis in the distal left circumflex coronary artery. I do not see that left ventriculography was performed. Her RCA stent was placed on 09/18/2012   Allergies:   Allergies  Allergen Reactions  . Ciprofloxacin Itching and Nausea And Vomiting  . Sulfonamide Derivatives Hives  . Bee Venom Swelling and Rash  . Pravastatin Rash    "swelling in legs/arms, bumps all over"       Past Medical History  Diagnosis Date  . Rectal bleed 11/02/2010  . Epigastric abdominal pain 08/19/2010  . Sciatica   . Back pain, chronic   . Diabetes mellitus   . Hypertension   . Asthma   . Angina at rest   . COPD (chronic obstructive pulmonary disease)   . Coronary artery disease   . OCD (obsessive compulsive disorder)   . Fibromyalgia     Past Surgical History  Procedure Laterality Date  . Colonoscopy w/ biopsies  12/24/2010    NUR  . Colonoscopy  11/30/2010  . Abdominal hysterectomy    .  Cholecystectomy    . Esophagogastroduodenoscopy  03/22/2012    Procedure: ESOPHAGOGASTRODUODENOSCOPY (EGD);  Surgeon: Rogene Houston, MD;  Location: [facility];  Service: Endoscopy;  Laterality: N/A;  300  . Coronary angioplasty with stent placement      March of 2014  . Colonoscopy, esophagogastroduodenoscopy (egd) and esophageal dilation N/A 04/05/2013    Procedure: COLONOSCOPY, ESOPHAGOGASTRODUODENOSCOPY (EGD) AND ESOPHAGEAL DILATION;  Surgeon: Rogene Houston, MD;  Location: [facility];  Service: Endoscopy;  Laterality: N/A;    Prior to Admission medications   Medication Sig Start Date End Date Taking? Authorizing Provider  aspirin EC 81 MG tablet Take 81 mg by mouth daily.   Yes Historical Provider, MD  benazepril (LOTENSIN) 10 MG tablet Take 10 mg by mouth daily.   Yes Historical Provider, MD  cetirizine (ZYRTEC) 10 MG tablet Take 10 mg by mouth daily.   Yes Historical Provider, MD  clopidogrel (PLAVIX) 75 MG tablet Take 75 mg by mouth daily.   Yes Historical Provider, MD  esomeprazole (NEXIUM) 40 MG capsule Take 40 mg by mouth daily before breakfast.   Yes Historical Provider, MD  ezetimibe (ZETIA) 10 MG tablet Take 1 tablet (10 mg total) by mouth daily. 10/01/13  Yes Herminio Commons, MD  HYDROcodone-acetaminophen (NORCO) 7.5-325 MG per tablet Take 1 tablet by mouth every 6 (six) hours as needed for pain.   Yes Historical Provider, MD  insulin aspart (NOVOLOG) 100 UNIT/ML FlexPen Inject 0-20 Units into the skin 3 (three) times  daily with meals. Based on sliding scale   Yes Historical Provider, MD  insulin glargine (LANTUS) 100 UNIT/ML injection Inject 20 Units into the skin at bedtime.   Yes Historical Provider, MD  levothyroxine (SYNTHROID, LEVOTHROID) 75 MCG tablet Take 75 mcg by mouth daily.     Yes Historical Provider, MD  LORAZEPAM PO Take 0.5 tablets by mouth 3 (three) times daily as needed (for anxiety).   Yes Historical Provider, MD  magnesium oxide (MAG-OX) 400 MG  tablet Take 400 mg by mouth daily.   Yes Historical Provider, MD  metformin (FORTAMET) 500 MG (OSM) 24 hr tablet Take 500 mg by mouth 2 (two) times daily before a meal.    Yes Historical Provider, MD  metoprolol (LOPRESSOR) 50 MG tablet Take 50 mg by mouth 2 (two) times daily.   Yes Historical Provider, MD  Mirtazapine (REMERON PO) Take 1 capsule by mouth at bedtime.   Yes Historical Provider, MD  nitroGLYCERIN (NITROSTAT) 0.4 MG SL tablet Place 0.4 mg under the tongue every 5 (five) minutes as needed for chest pain.   Yes Historical Provider, MD  oxybutynin (DITROPAN) 5 MG tablet Take 5 mg by mouth daily.   Yes Historical Provider, MD  potassium chloride (K-DUR,KLOR-CON) 10 MEQ tablet Take 10 mEq by mouth daily.    Yes Historical Provider, MD  pregabalin (LYRICA) 100 MG capsule Take 100 mg by mouth 3 (three) times daily.   Yes Historical Provider, MD  Sertraline HCl (ZOLOFT PO) Take 1 tablet by mouth daily.   Yes Historical Provider, MD  UNKNOWN TO PATIENT Take 1 tablet by mouth daily. Prescribed via Mental Health-Name is unknown   Yes Historical Provider, MD  albuterol (PROAIR HFA) 108 (90 BASE) MCG/ACT inhaler Inhale 2 puffs into the lungs every 6 (six) hours as needed for wheezing or shortness of breath.    Historical Provider, MD  lidocaine (LIDODERM) 5 % Place 1 patch onto the skin as needed. Remove & Discard patch within 12 hours or as directed by MD    Historical Provider, MD  ranitidine (ZANTAC) 75 MG tablet Take 75 mg by mouth at bedtime.    Historical Provider, MD  rOPINIRole (REQUIP) 1 MG tablet Take 1 mg by mouth at bedtime.    Historical Provider, MD    Social History:  reports that she quit smoking about 2 years ago. Her smoking use included Cigarettes. She started smoking about 36 years ago. She has a 10 pack-year smoking history. She has never used smokeless tobacco. She reports that she does not drink alcohol or use illicit drugs.  History reviewed. No pertinent family  history.   All the positives are listed in BOLD  Review of Systems:  HEENT: Headache, blurred vision, runny nose, sore throat Neck: Hypothyroidism, hyperthyroidism,,lymphadenopathy Chest : Shortness of breath, history of COPD, Asthma Heart : Chest pain, history of coronary arterey disease GI:  Nausea, vomiting, diarrhea, constipation, GERD GU: Dysuria, urgency, frequency of urination, hematuria Neuro: Stroke, seizures, syncope Psych: Depression, anxiety, hallucinations   Physical Exam: Blood pressure 101/65, pulse 54, temperature 98 F (36.7 C), temperature source Oral, resp. rate 19, SpO2 98.00%. Constitutional:   Patient is a well-developed and well-nourished female in no acute distress and cooperative with exam. Head: Normocephalic and atraumatic Mouth: Mucus membranes moist Eyes: PERRL, EOMI, conjunctivae normal Neck: Supple, No Thyromegaly Cardiovascular: RRR, S1 normal, S2 normal Pulmonary/Chest: CTAB, no wheezes, rales, or rhonchi Abdominal: Soft. Non-tender, non-distended, bowel sounds are normal, no masses, organomegaly, or guarding present.  Neurological: A&O x3, Strenght is normal and symmetric bilaterally, cranial nerve II-XII are grossly intact, no focal motor deficit, sensory intact to light touch bilaterally.  Extremities : No Cyanosis, Clubbing or Edema  Labs on Admission:  Basic Metabolic Panel:  Recent Labs Lab 03/18/14 2233  NA 140  K 4.3  CL 101  CO2 25  GLUCOSE 124*  BUN 20  CREATININE 1.20*  CALCIUM 9.4   Liver Function Tests: No results found for this basename: AST, ALT, ALKPHOS, BILITOT, PROT, ALBUMIN,  in the last 168 hours No results found for this basename: LIPASE, AMYLASE,  in the last 168 hours No results found for this basename: AMMONIA,  in the last 168 hours CBC:  Recent Labs Lab 03/18/14 2233  WBC 7.9  HGB 12.7  HCT 38.3  MCV 87.8  PLT 184   Cardiac Enzymes:  Recent Labs Lab 03/18/14 2233 03/19/14 0140  TROPONINI <0.30  <0.30    BNP (last 3 results) No results found for this basename: PROBNP,  in the last 8760 hours CBG: No results found for this basename: GLUCAP,  in the last 168 hours  Radiological Exams on Admission: Dg Chest Port 1 View  03/18/2014   CLINICAL DATA:  Chest pain  EXAM: PORTABLE CHEST - 1 VIEW  COMPARISON:  None.  FINDINGS: Lungs are clear.  No pleural effusion or pneumothorax.  The heart is normal in size.  IMPRESSION: No evidence of acute cardiopulmonary disease.   Electronically Signed   By: Julian Hy M.D.   On: 03/18/2014 22:55    EKG: Independently reviewed. Normal sinus rhythm   Assessment/Plan Active Problems:   Chest pain  Chest pain Patient has ongoing chest pain we'll admit her to start on unit and obtain cardiology consultation in the a.m. Cardiac enzymes are negative in the ED the site of the cardiac enzymes every 6 hours x3. Continue morphine for pain. We'll also start Nitropaste half inch every 6 hours  Diabetes mellitus Pending Lantus and initiate sliding scale insulin.  Code status: Full code  Family discussion: No family at bedside   Time Spent on Admission: 58 minutes  Garcia Dalzell S [facility] Pager: [phone] 03/19/2014, 4:23 AM  If 7PM-7AM, please contact night-coverage  [email or web address]  Password [email or web address]

## 2014-03-19 NOTE — ED Notes (Signed)
Now c/o increased "sharp" pain mid chest. No outward change in pt's demeanor, expression, cardiac rhythm.

## 2014-03-19 NOTE — Consult Note (Signed)
Primary cardiologist: Dr. Kate Sable Consulting cardiologist: Dr. Satira Sark  Clinical Summary Ms. Childers is a 53 y.o.female with past medical history outlined below, currently admitted with recurring chest pain symptoms. She describes progressive chest tightness and feeling of choking that began yesterday after having had dinner. She laid down to rest, went to sleep, when she woke up her symptoms were much more intense as if someone were sitting on her chest. These symptoms have continued intermittently since yesterday, not resolved with nitroglycerin. She also tells me that she has a chronic problem with dysphagia, not worse recently. She has seen Dr. Laural Golden for this in the past.  She was actually due to see Dr. Bronson Ing in the office today. Echocardiogram from April of this year reported mild LVH with LVEF 55-60%, no regional wall motion abnormalities, no major valvular mouth is.  ECG shows no acute ST segment changes compared to most recent tracing in August. Cardiac markers argue against ACS. Chest x-ray shows no acute findings. She continues to have intermittent chest pressure.   Allergies  Allergen Reactions  . Ciprofloxacin Itching and Nausea And Vomiting  . Sulfonamide Derivatives Hives  . Bee Venom Swelling and Rash  . Pravastatin Rash    "swelling in legs/arms, bumps all over"     Medications Scheduled Medications: . antiseptic oral rinse  7 mL Mouth Rinse q12n4p  . aspirin EC  81 mg Oral Daily  . benazepril  10 mg Oral Daily  . chlorhexidine  15 mL Mouth Rinse BID  . clopidogrel  75 mg Oral QAC breakfast  . enoxaparin (LOVENOX) injection  60 mg Subcutaneous Q24H  . ezetimibe  10 mg Oral Daily  . insulin aspart  0-9 Units Subcutaneous TID WC  . insulin glargine  20 Units Subcutaneous QHS  . levothyroxine  75 mcg Oral QAC breakfast  . loratadine  10 mg Oral Daily  . metoprolol  50 mg Oral BID  . nitroGLYCERIN  0.5 inch Topical 4 times per day  .  oxybutynin  5 mg Oral Daily  . pantoprazole  40 mg Oral Daily  . potassium chloride  10 mEq Oral Daily  . pregabalin  100 mg Oral TID  . rOPINIRole  1 mg Oral QHS  . sodium chloride  3 mL Intravenous Q12H      PRN Medications:  sodium chloride, albuterol, LORazepam, morphine injection, nitroGLYCERIN, ondansetron (ZOFRAN) IV, ondansetron, sodium chloride   Past Medical History  Diagnosis Date  . Rectal bleed 11/02/2010  . Epigastric abdominal pain 08/19/2010  . Sciatica   . Back pain, chronic   . Type 2 diabetes mellitus   . Essential hypertension, benign   . Asthma   . COPD (chronic obstructive pulmonary disease)   . Coronary atherosclerosis of native coronary artery     Prior stent RCA - patent May 2014 with otherwise nonobstructive disease  . OCD (obsessive compulsive disorder)   . Fibromyalgia     Past Surgical History  Procedure Laterality Date  . Colonoscopy w/ biopsies  12/24/2010    NUR  . Colonoscopy  11/30/2010  . Abdominal hysterectomy    . Cholecystectomy    . Esophagogastroduodenoscopy  03/22/2012    Procedure: ESOPHAGOGASTRODUODENOSCOPY (EGD);  Surgeon: Rogene Houston, MD;  Location: [facility];  Service: Endoscopy;  Laterality: N/A;  300  . Coronary angioplasty with stent placement      March of 2014  . Colonoscopy, esophagogastroduodenoscopy (egd) and esophageal dilation N/A 04/05/2013  Procedure: COLONOSCOPY, ESOPHAGOGASTRODUODENOSCOPY (EGD) AND ESOPHAGEAL DILATION;  Surgeon: Rogene Houston, MD;  Location: [facility];  Service: Endoscopy;  Laterality: N/A;    Family History  Problem Relation Age of Onset  . Multiple sclerosis Sister     Social History Ms. Teo reports that she quit smoking about 2 years ago. Her smoking use included Cigarettes. She started smoking about 36 years ago. She has a 10 pack-year smoking history. She has never used smokeless tobacco. Ms. Lastinger reports that she does not drink alcohol.  Review of  Systems Dysphasia as outlined above. No palpitations or syncope. Reports limited ability to ambulate due to diabetes and leg pain. Other systems reviewed and negative except as outlined.  Physical Examination Blood pressure 109/55, pulse 57, temperature 97.8 F (36.6 C), temperature source Oral, resp. rate 19, height 5\' 9"  (1.753 m), weight 298 lb 1 oz (135.2 kg), SpO2 99.00%.  Intake/Output Summary (Last 24 hours) at 03/19/14 0841 Last data filed at 03/19/14 0724  Gross per 24 hour  Intake 1003.83 ml  Output    600 ml  Net 403.83 ml   Telemetry: Sinus rhythm.  Morbidly obese woman, no distress. HEENT: Conjunctiva and lids normal, oropharynx clear. Neck: Supple, no elevated JVP or carotid bruits, no thyromegaly. Lungs: Clear to auscultation, nonlabored breathing at rest. Cardiac: Regular rate and rhythm, no S3 or significant systolic murmur, no pericardial rub. Abdomen: Soft, nontender, protuberant, bowel sounds present, no guarding or rebound. Extremities: No pitting edema, distal pulses 2+. Skin: Warm and dry. Musculoskeletal: No kyphosis. Neuropsychiatric: Alert and oriented x3, affect grossly appropriate.   Lab Results  Basic Metabolic Panel:  Recent Labs Lab 03/18/14 2233 03/19/14 0549  NA 140 139  K 4.3 3.9  CL 101 101  CO2 25 26  GLUCOSE 124* 143*  BUN 20 18  CREATININE 1.20* 0.91  CALCIUM 9.4 9.2    Liver Function Tests:  Recent Labs Lab 03/19/14 0549  AST 34  ALT 46*  ALKPHOS 76  BILITOT 0.4  PROT 7.0  ALBUMIN 3.5    CBC:  Recent Labs Lab 03/18/14 2233 03/19/14 0549  WBC 7.9 6.6  HGB 12.7 11.9*  HCT 38.3 35.8*  MCV 87.8 87.5  PLT 184 165    Cardiac Enzymes:  Recent Labs Lab 03/18/14 2233 03/19/14 0140 03/19/14 0549  TROPONINI <0.30 <0.30 <0.30    Imaging PORTABLE CHEST - 1 VIEW  COMPARISON: None.  FINDINGS: Lungs are clear. No pleural effusion or pneumothorax.  The heart is normal in size.  IMPRESSION: No evidence  of acute cardiopulmonary disease.   Impression  1. Presentation with chest pain, typical and atypical features for angina. ECG without acute ST segment changes, and cardiac markers argue against ACS. Having said that, she does have a known RCA stent that was noted to be patent at angiography a year and half ago. She also reports dysphagia, and her history includes recurring chest pain symptoms without obvious cardiac etiology.  2. Type 2 diabetes mellitus with neuropathy.  3. Essential hypertension.  4. History of fibromyalgia.   Recommendations  Reviewed records, discussed with patient. We will proceed with objective ischemic testing in light of her symptoms, although at this time doubt that this represents ACS with continuing angina. She will be scheduled for a 2 day protocol Lexiscan Cardiolite, rest imaging obtained today, and stress imaging tomorrow. Continue current cardiac regimen including aspirin, Lotensin, Plavix, Zetia, and Lopressor.  Satira Sark, M.D., F.A.C.C.

## 2014-03-19 NOTE — ED Provider Notes (Signed)
CSN: [id number]     Arrival date & time 03/18/14  2216 History   First MD Initiated Contact with Patient 03/19/14 0101     Chief Complaint  Patient presents with  . Chest Pain     (Consider location/radiation/quality/duration/timing/severity/associated sxs/prior Treatment) HPI This is a 53 year old female with history of coronary artery disease and episodic chest pain. She is here with chest pain that began about 6 PM. She describes the pain as sharp and substernal radiating to her back in the right side of her neck. It was associated with shortness of breath and nausea but no diaphoresis. She had no relief with nitroglycerin at home so came to the ED but 10 PM. She was given aspirin and 2 sublingual nitroglycerin with partial relief of the pain. It was initially about an 8/10 and describes it now as being a 5/10. It is similar to chest pain she has had in the past.  Past Medical History  Diagnosis Date  . Rectal bleed 11/02/2010  . Epigastric abdominal pain 08/19/2010  . Sciatica   . Back pain, chronic   . Diabetes mellitus   . Hypertension   . Asthma   . Angina at rest   . COPD (chronic obstructive pulmonary disease)   . Coronary artery disease   . OCD (obsessive compulsive disorder)   . Fibromyalgia    Past Surgical History  Procedure Laterality Date  . Colonoscopy w/ biopsies  12/24/2010    NUR  . Colonoscopy  11/30/2010  . Abdominal hysterectomy    . Cholecystectomy    . Esophagogastroduodenoscopy  03/22/2012    Procedure: ESOPHAGOGASTRODUODENOSCOPY (EGD);  Surgeon: Rogene Houston, MD;  Location: [facility];  Service: Endoscopy;  Laterality: N/A;  300  . Coronary angioplasty with stent placement      March of 2014  . Colonoscopy, esophagogastroduodenoscopy (egd) and esophageal dilation N/A 04/05/2013    Procedure: COLONOSCOPY, ESOPHAGOGASTRODUODENOSCOPY (EGD) AND ESOPHAGEAL DILATION;  Surgeon: Rogene Houston, MD;  Location: [facility];  Service: Endoscopy;   Laterality: N/A;   History reviewed. No pertinent family history. History  Substance Use Topics  . Smoking status: Former Smoker -- 0.50 packs/day for 20 years    Types: Cigarettes    Start date: 08/16/1977    Quit date: 09/21/2011  . Smokeless tobacco: Never Used     Comment: Quit 56yr ago after smoking 12 yrs  . Alcohol Use: No     Comment: rare   OB History   Grav Para Term Preterm Abortions TAB SAB Ect Mult Living                 Review of Systems  All other systems reviewed and are negative.   Allergies  Ciprofloxacin; Sulfonamide derivatives; Bee venom; and Pravastatin  Home Medications   Prior to Admission medications   Medication Sig Start Date End Date Taking? Authorizing Provider  aspirin EC 81 MG tablet Take 81 mg by mouth daily.   Yes Historical Provider, MD  benazepril (LOTENSIN) 10 MG tablet Take 10 mg by mouth daily.   Yes Historical Provider, MD  cetirizine (ZYRTEC) 10 MG tablet Take 10 mg by mouth daily.   Yes Historical Provider, MD  clopidogrel (PLAVIX) 75 MG tablet Take 75 mg by mouth daily.   Yes Historical Provider, MD  esomeprazole (NEXIUM) 40 MG capsule Take 40 mg by mouth daily before breakfast.   Yes Historical Provider, MD  ezetimibe (ZETIA) 10 MG tablet Take 1 tablet (10 mg total)  by mouth daily. 10/01/13  Yes Herminio Commons, MD  HYDROcodone-acetaminophen (NORCO) 7.5-325 MG per tablet Take 1 tablet by mouth every 6 (six) hours as needed for pain.   Yes Historical Provider, MD  insulin aspart (NOVOLOG) 100 UNIT/ML FlexPen Inject 0-20 Units into the skin 3 (three) times daily with meals. Based on sliding scale   Yes Historical Provider, MD  insulin glargine (LANTUS) 100 UNIT/ML injection Inject 20 Units into the skin at bedtime.   Yes Historical Provider, MD  levothyroxine (SYNTHROID, LEVOTHROID) 75 MCG tablet Take 75 mcg by mouth daily.     Yes Historical Provider, MD  LORAZEPAM PO Take 0.5 tablets by mouth 3 (three) times daily as needed (for  anxiety).   Yes Historical Provider, MD  magnesium oxide (MAG-OX) 400 MG tablet Take 400 mg by mouth daily.   Yes Historical Provider, MD  metformin (FORTAMET) 500 MG (OSM) 24 hr tablet Take 500 mg by mouth 2 (two) times daily before a meal.    Yes Historical Provider, MD  metoprolol (LOPRESSOR) 50 MG tablet Take 50 mg by mouth 2 (two) times daily.   Yes Historical Provider, MD  Mirtazapine (REMERON PO) Take 1 capsule by mouth at bedtime.   Yes Historical Provider, MD  nitroGLYCERIN (NITROSTAT) 0.4 MG SL tablet Place 0.4 mg under the tongue every 5 (five) minutes as needed for chest pain.   Yes Historical Provider, MD  oxybutynin (DITROPAN) 5 MG tablet Take 5 mg by mouth daily.   Yes Historical Provider, MD  potassium chloride (K-DUR,KLOR-CON) 10 MEQ tablet Take 10 mEq by mouth daily.    Yes Historical Provider, MD  pregabalin (LYRICA) 100 MG capsule Take 100 mg by mouth 3 (three) times daily.   Yes Historical Provider, MD  Sertraline HCl (ZOLOFT PO) Take 1 tablet by mouth daily.   Yes Historical Provider, MD  UNKNOWN TO PATIENT Take 1 tablet by mouth daily. Prescribed via Mental Health-Name is unknown   Yes Historical Provider, MD  albuterol (PROAIR HFA) 108 (90 BASE) MCG/ACT inhaler Inhale 2 puffs into the lungs every 6 (six) hours as needed for wheezing or shortness of breath.    Historical Provider, MD  lidocaine (LIDODERM) 5 % Place 1 patch onto the skin as needed. Remove & Discard patch within 12 hours or as directed by MD    Historical Provider, MD  ranitidine (ZANTAC) 75 MG tablet Take 75 mg by mouth at bedtime.    Historical Provider, MD  rOPINIRole (REQUIP) 1 MG tablet Take 1 mg by mouth at bedtime.    Historical Provider, MD   BP 111/57  Pulse 63  Temp(Src) 98 F (36.7 C) (Oral)  Resp 17  SpO2 94%  Physical Exam General: Well-developed, well-nourished female in no acute distress; appearance consistent with age of record HENT: normocephalic; atraumatic Eyes: pupils equal, round  and reactive to light; extraocular muscles intact Neck: supple Heart: regular rate and rhythm; no murmur Lungs: clear to auscultation bilaterally Abdomen: soft; nondistended; nontender; no masses or hepatosplenomegaly; bowel sounds present Extremities: No deformity; full range of motion; pulses normal Neurologic: Awake, alert and oriented; motor function intact in all extremities and symmetric; no facial droop Skin: Warm and dry Psychiatric: Normal mood and affect    ED Course  Procedures (including critical care time)    EKG Interpretation   Date/Time:  Monday March 18 2014 22:44:57 EDT Ventricular Rate:  58 PR Interval:  181 QRS Duration: 90 QT Interval:  414 QTC Calculation: 407 R Axis:  78 Text Interpretation:  Sinus rhythm Abnormal R-wave progression, early  transition Minimal ST elevation, inferior leads No significant change was  found Confirmed by Jhonathan Desroches  MD, Jenny Reichmann ([id number]) on 03/18/2014 10:56:12 PM      MDM   Nursing notes and vitals signs, including pulse oximetry, reviewed.  Summary of this visit's results, reviewed by myself:  Labs:  Results for orders placed during the hospital encounter of 03/18/14 (from the past 24 hour(s))  CBC     Status: None   Collection Time    03/18/14 10:33 PM      Result Value Ref Range   WBC 7.9  4.0 - 10.5 K/uL   RBC 4.36  3.87 - 5.11 MIL/uL   Hemoglobin 12.7  12.0 - 15.0 g/dL   HCT 38.3  36.0 - 46.0 %   MCV 87.8  78.0 - 100.0 fL   MCH 29.1  26.0 - 34.0 pg   MCHC 33.2  30.0 - 36.0 g/dL   RDW 13.9  11.5 - 15.5 %   Platelets 184  150 - 400 K/uL  BASIC METABOLIC PANEL     Status: Abnormal   Collection Time    03/18/14 10:33 PM      Result Value Ref Range   Sodium 140  137 - 147 mEq/L   Potassium 4.3  3.7 - 5.3 mEq/L   Chloride 101  96 - 112 mEq/L   CO2 25  19 - 32 mEq/L   Glucose, Bld 124 (*) 70 - 99 mg/dL   BUN 20  6 - 23 mg/dL   Creatinine, Ser 1.20 (*) 0.50 - 1.10 mg/dL   Calcium 9.4  8.4 - 10.5 mg/dL   GFR  calc non Af Amer 51 (*) >90 mL/min   GFR calc Af Amer 59 (*) >90 mL/min   Anion gap 14  5 - 15  TROPONIN I     Status: None   Collection Time    03/18/14 10:33 PM      Result Value Ref Range   Troponin I <0.30  <0.30 ng/mL  TROPONIN I     Status: None   Collection Time    03/19/14  1:40 AM      Result Value Ref Range   Troponin I <0.30  <0.30 ng/mL    Imaging Studies: Dg Chest Port 1 View  03/18/2014   CLINICAL DATA:  Chest pain  EXAM: PORTABLE CHEST - 1 VIEW  COMPARISON:  None.  FINDINGS: Lungs are clear.  No pleural effusion or pneumothorax.  The heart is normal in size.  IMPRESSION: No evidence of acute cardiopulmonary disease.   Electronically Signed   By: Julian Hy M.D.   On: 03/18/2014 22:55   2:21 AM Patient states pain is worse after additional nitroglycerin. Second troponin unremarkable.     Wynetta Fines, MD 03/19/14 [phone]

## 2014-03-20 ENCOUNTER — Encounter (HOSPITAL_COMMUNITY): Payer: Self-pay

## 2014-03-20 ENCOUNTER — Inpatient Hospital Stay (HOSPITAL_COMMUNITY): Payer: Medicaid Other

## 2014-03-20 DIAGNOSIS — R079 Chest pain, unspecified: Secondary | ICD-10-CM

## 2014-03-20 LAB — GLUCOSE, CAPILLARY
Glucose-Capillary: 147 mg/dL — ABNORMAL HIGH (ref 70–99)
Glucose-Capillary: 165 mg/dL — ABNORMAL HIGH (ref 70–99)

## 2014-03-20 MED ORDER — SODIUM CHLORIDE 0.9 % IJ SOLN
10.0000 mL | INTRAMUSCULAR | Status: DC | PRN
  Administered 2014-03-20: 10 mL via INTRAVENOUS

## 2014-03-20 MED ORDER — TECHNETIUM TC 99M SESTAMIBI GENERIC - CARDIOLITE
30.0000 | Freq: Once | INTRAVENOUS | Status: AC | PRN
  Administered 2014-03-20: 30 via INTRAVENOUS

## 2014-03-20 MED ORDER — SODIUM CHLORIDE 0.9 % IJ SOLN
INTRAMUSCULAR | Status: AC
  Administered 2014-03-20: 10 mL via INTRAVENOUS
  Filled 2014-03-20: qty 10

## 2014-03-20 MED ORDER — POLYETHYLENE GLYCOL 3350 17 G PO PACK
17.0000 g | PACK | ORAL | Status: AC
  Administered 2014-03-20: 17 g via ORAL
  Filled 2014-03-20: qty 1

## 2014-03-20 MED ORDER — REGADENOSON 0.4 MG/5ML IV SOLN
INTRAVENOUS | Status: AC
  Administered 2014-03-20: 0.4 mg via INTRAVENOUS
  Filled 2014-03-20: qty 5

## 2014-03-20 NOTE — Discharge Summary (Signed)
Physician Discharge Summary  Amber Fischer KKX:[id number] DOB: 1960-07-09 DOA: 03/18/2014  PCP: Neale Burly, MD  Admit date: 03/18/2014 Discharge date: 03/20/2014  Time spent: 35 minutes  Recommendations for Outpatient Follow-up:  1. Follow up with PCP in 1-2 weeks 2. Continue outpatient work up for non-cardiac chest pain  Discharge Diagnoses:  Active Problems:   Essential hypertension, benign   Precordial pain   Discharge Condition: Stable  Diet recommendation: Heart healthy  Filed Weights   03/19/14 0521  Weight: 135.2 kg (298 lb 1 oz)    History of present illness:  See admit h and p from 9/29 for details. Briefly, pt presents with chest pains in the setting of cad. Pt was admitted for further work up.  Hospital Course:  1. Chest pain  1. Cardiology following 2. Pt is s/p 2 day stress test which was normal 3. Trop serially neg x 5 4. Pt stable for discharge home per Cardiology 2. DM 2  1. BS remaining relatively stable 3. HTN  1. BP stable and controlled 2. Cont current regimen 4. Hx Tobacco abuse  1. Quit about 25yrs ago  Procedures:  2 day Lexiscan stress  Consultations:  Cardiology  Discharge Exam: Filed Vitals:   03/20/14 0000 03/20/14 0100 03/20/14 0700 03/20/14 1000  BP: 104/47 101/57 112/63   Pulse: 65 58 62 81  Temp:      TempSrc:      Resp: 15 18 16 16   Height:      Weight:      SpO2: 95% 93% 93% 95%   General: Awake, in nad Cardiovascular: regular,s 1, s2 Respiratory: normal resp effort, no wheezing  Discharge Instructions     Medication List         aspirin EC 81 MG tablet  Take 81 mg by mouth daily.     benazepril 10 MG tablet  Commonly known as:  LOTENSIN  Take 10 mg by mouth daily.     cetirizine 10 MG tablet  Commonly known as:  ZYRTEC  Take 10 mg by mouth daily.     clopidogrel 75 MG tablet  Commonly known as:  PLAVIX  Take 75 mg by mouth daily.     esomeprazole 40 MG capsule  Commonly known as:  NEXIUM   Take 40 mg by mouth daily before breakfast.     ezetimibe 10 MG tablet  Commonly known as:  ZETIA  Take 1 tablet (10 mg total) by mouth daily.     HYDROcodone-acetaminophen 7.5-325 MG per tablet  Commonly known as:  NORCO  Take 1 tablet by mouth every 6 (six) hours as needed for pain.     insulin aspart 100 UNIT/ML FlexPen  Commonly known as:  NOVOLOG  Inject 0-20 Units into the skin 3 (three) times daily with meals. Based on sliding scale     insulin glargine 100 UNIT/ML injection  Commonly known as:  LANTUS  Inject 20 Units into the skin at bedtime.     levothyroxine 75 MCG tablet  Commonly known as:  SYNTHROID, LEVOTHROID  Take 75 mcg by mouth daily.     LORazepam 0.5 MG tablet  Commonly known as:  ATIVAN  Take 0.25-0.5 mg by mouth 2 (two) times daily as needed for anxiety.     magnesium oxide 400 MG tablet  Commonly known as:  MAG-OX  Take 400 mg by mouth daily.     metformin 500 MG (OSM) 24 hr tablet  Commonly known as:  FORTAMET  Take  500 mg by mouth 2 (two) times daily before a meal.     metoprolol 50 MG tablet  Commonly known as:  LOPRESSOR  Take 50 mg by mouth 2 (two) times daily.     mirtazapine 15 MG tablet  Commonly known as:  REMERON  Take 7.5-15 mg by mouth at bedtime as needed (sleep).     nitroGLYCERIN 0.4 MG SL tablet  Commonly known as:  NITROSTAT  Place 0.4 mg under the tongue every 5 (five) minutes as needed for chest pain.     oxybutynin 5 MG tablet  Commonly known as:  DITROPAN  Take 5 mg by mouth daily.     potassium chloride 10 MEQ tablet  Commonly known as:  K-DUR,KLOR-CON  Take 10 mEq by mouth daily.     pregabalin 100 MG capsule  Commonly known as:  LYRICA  Take 100 mg by mouth 3 (three) times daily.     PROAIR HFA 108 (90 BASE) MCG/ACT inhaler  Generic drug:  albuterol  Inhale 2 puffs into the lungs every 6 (six) hours as needed for wheezing or shortness of breath.     rOPINIRole 1 MG tablet  Commonly known as:  REQUIP   Take 1 mg by mouth at bedtime.     sertraline 50 MG tablet  Commonly known as:  ZOLOFT  Take 50 mg by mouth daily.       Allergies  Allergen Reactions  . Ciprofloxacin Itching and Nausea And Vomiting  . Sulfonamide Derivatives Hives  . Bee Venom Swelling and Rash  . Pravastatin Rash    "swelling in legs/arms, bumps all over"       The results of significant diagnostics from this hospitalization (including imaging, microbiology, ancillary and laboratory) are listed below for reference.    Significant Diagnostic Studies: Dg Chest Port 1 View  03/18/2014   CLINICAL DATA:  Chest pain  EXAM: PORTABLE CHEST - 1 VIEW  COMPARISON:  None.  FINDINGS: Lungs are clear.  No pleural effusion or pneumothorax.  The heart is normal in size.  IMPRESSION: No evidence of acute cardiopulmonary disease.   Electronically Signed   By: Julian Hy M.D.   On: 03/18/2014 22:55    Microbiology: Recent Results (from the past 240 hour(s))  MRSA PCR SCREENING     Status: None   Collection Time    03/19/14  5:00 AM      Result Value Ref Range Status   MRSA by PCR NEGATIVE  NEGATIVE Final   Comment:            The GeneXpert MRSA Assay (FDA     approved for NASAL specimens     only), is one component of a     comprehensive MRSA colonization     surveillance program. It is not     intended to diagnose MRSA     infection nor to guide or     monitor treatment for     MRSA infections.     Labs: Basic Metabolic Panel:  Recent Labs Lab 03/18/14 2233 03/19/14 0549  NA 140 139  K 4.3 3.9  CL 101 101  CO2 25 26  GLUCOSE 124* 143*  BUN 20 18  CREATININE 1.20* 0.91  CALCIUM 9.4 9.2   Liver Function Tests:  Recent Labs Lab 03/19/14 0549  AST 34  ALT 46*  ALKPHOS 76  BILITOT 0.4  PROT 7.0  ALBUMIN 3.5   No results found for this basename: LIPASE, AMYLASE,  in the last 168 hours No results found for this basename: AMMONIA,  in the last 168 hours CBC:  Recent Labs Lab  03/18/14 2233 03/19/14 0549  WBC 7.9 6.6  HGB 12.7 11.9*  HCT 38.3 35.8*  MCV 87.8 87.5  PLT 184 165   Cardiac Enzymes:  Recent Labs Lab 03/18/14 2233 03/19/14 0140 03/19/14 0549 03/19/14 1121 03/19/14 1705  TROPONINI <0.30 <0.30 <0.30 <0.30 <0.30   BNP: BNP (last 3 results) No results found for this basename: PROBNP,  in the last 8760 hours CBG:  Recent Labs Lab 03/19/14 0741 03/19/14 1205 03/19/14 1636 03/19/14 2043 03/20/14 0822  GLUCAP 170* 165* 150* 113* 147*   Signed:  Shelia Kingsberry K  [facility] 03/20/2014, 10:51 AM

## 2014-03-20 NOTE — Progress Notes (Signed)
Patient reports relief from IV antiemetic.

## 2014-03-20 NOTE — Progress Notes (Signed)
Consulting cardiologist: Rozann Lesches MD Primary Cardiologist: Kate Sable MD  Subjective:   Patient is seen and examined prior to [facility] stress test in lab.  She denies chest pain or other symptoms overnight.  Objective:   Temp:  [97.7 F (36.5 C)-98 F (36.7 C)] 98 F (36.7 C) (09/29 2258) Pulse Rate:  [55-81] 58 (09/30 0100) Resp:  [12-24] 18 (09/30 0100) BP: (69-116)/(24-75) 101/57 mmHg (09/30 0100) SpO2:  [93 %-100 %] 93 % (09/30 0100) Last BM Date: 03/18/14  Filed Weights   03/19/14 0521  Weight: 298 lb 1 oz (135.2 kg)    Intake/Output Summary (Last 24 hours) at 03/20/14 0856 Last data filed at 03/20/14 0758  Gross per 24 hour  Intake     10 ml  Output   1100 ml  Net  -1090 ml    Telemetry: NSR  Exam:  General: No acute distress.  Lungs: Clear to auscultation, nonlabored.  Cardiac: No elevated JVP or bruits. RRR, no gallop or rub.   Abdomen: Normoactive bowel sounds, nontender, nondistended. Obese  Extremities: No pitting edema, distal pulses full.   Lab Results:  Basic Metabolic Panel:  Recent Labs Lab 03/18/14 2233 03/19/14 0549  NA 140 139  K 4.3 3.9  CL 101 101  CO2 25 26  GLUCOSE 124* 143*  BUN 20 18  CREATININE 1.20* 0.91  CALCIUM 9.4 9.2    Liver Function Tests:  Recent Labs Lab 03/19/14 0549  AST 34  ALT 46*  ALKPHOS 76  BILITOT 0.4  PROT 7.0  ALBUMIN 3.5    CBC:  Recent Labs Lab 03/18/14 2233 03/19/14 0549  WBC 7.9 6.6  HGB 12.7 11.9*  HCT 38.3 35.8*  MCV 87.8 87.5  PLT 184 165    Cardiac Enzymes:  Recent Labs Lab 03/19/14 0549 03/19/14 1121 03/19/14 1705  TROPONINI <0.30 <0.30 <0.30    Radiology: Dg Chest Port 1 View  03/18/2014   CLINICAL DATA:  Chest pain  EXAM: PORTABLE CHEST - 1 VIEW  COMPARISON:  None.  FINDINGS: Lungs are clear.  No pleural effusion or pneumothorax.  The heart is normal in size.  IMPRESSION: No evidence of acute cardiopulmonary disease.   Electronically Signed    By: Julian Hy M.D.   On: 03/18/2014 22:55     Medications:   Scheduled Medications: . antiseptic oral rinse  7 mL Mouth Rinse q12n4p  . aspirin EC  81 mg Oral Daily  . benazepril  10 mg Oral Daily  . chlorhexidine  15 mL Mouth Rinse BID  . clopidogrel  75 mg Oral QAC breakfast  . enoxaparin (LOVENOX) injection  60 mg Subcutaneous Q24H  . ezetimibe  10 mg Oral Daily  . Influenza vac split quadrivalent PF  0.5 mL Intramuscular Tomorrow-1000  . insulin aspart  0-9 Units Subcutaneous TID WC  . insulin glargine  20 Units Subcutaneous QHS  . levothyroxine  75 mcg Oral QAC breakfast  . loratadine  10 mg Oral Daily  . metoprolol  50 mg Oral BID  . nitroGLYCERIN  0.5 inch Topical 4 times per day  . oxybutynin  5 mg Oral Daily  . pantoprazole  40 mg Oral Daily  . potassium chloride  10 mEq Oral Daily  . pregabalin  100 mg Oral TID  . rOPINIRole  1 mg Oral QHS  . sodium chloride  3 mL Intravenous Q12H    PRN Medications: sodium chloride, albuterol, LORazepam, morphine injection, nitroGLYCERIN, ondansetron (ZOFRAN) IV, ondansetron, simethicone, sodium  chloride, sodium chloride   Assessment and Plan:   1. Chest Pain: She denies recurrent chest pain, but did experience mild pressure during stress test. Cardiac markers are normal as well. If negative should be able to go home from cardiac standpoint.   2. CAD: Stent to RCA and remains on Plavix and ASA.   3. Hypertension: Currently well controlled. She remains on metoprolol 50 mg BID.   Phill Myron. Lawrence NP  03/20/2014, 8:56 AM   Attending note:  Patient seen and examined. She underwent the stress portion of her Cardiolite today. Will review final images and make further recommendations from there.  Satira Sark, M.D., F.A.C.C.

## 2014-03-20 NOTE — Progress Notes (Signed)
Stress Lab Nurses Notes - [facility] Na  JACOLE CAPLEY 03/20/2014 Reason for doing test: Chest Pain Type of test: Lexiscan/cardiolite Inpatient Nurse performing test: Benay Pillow RN Nuclear Medicine Tech: Melburn Hake Echo Tech: Not Applicable MD performing test: S. McDowell/K Hessie Diener Family MD: Dr Zada Girt Test explained and consent signed: Yes.   IV started: IV in progress from floor Symptoms: Increased chest pressure, increased nausea Treatment/Intervention: none Reason test stopped: protocol completed After recovery IV was: No redness or edema Patient to return to Nuc. Med at :0825 Patient discharged: To floor as inpatient Patient's Condition upon discharge was: stable Comments: peak bp 132/65, HR 118, recovery bp 111/68, HR 87. Symptoms resolved to her baseline prior to stress test Khanh Cordner M

## 2014-03-20 NOTE — Progress Notes (Signed)
Patient aided to the bedside commode and reported feeling nausea when aided back to bed. -patient given a cold wash towel and given a basin

## 2014-03-20 NOTE — Progress Notes (Signed)
D/c instructions reviewed with patient.  Verbalized understanding.  Pt dc'd to home with mother. Schonewitz, Eulis Canner 03/20/2014

## 2014-04-05 ENCOUNTER — Other Ambulatory Visit (INDEPENDENT_AMBULATORY_CARE_PROVIDER_SITE_OTHER): Payer: Self-pay | Admitting: Internal Medicine

## 2014-04-08 NOTE — Progress Notes (Signed)
UR chart review completed.  

## 2014-05-06 ENCOUNTER — Encounter: Payer: Self-pay | Admitting: Cardiovascular Disease

## 2014-05-30 ENCOUNTER — Encounter (INDEPENDENT_AMBULATORY_CARE_PROVIDER_SITE_OTHER): Payer: Self-pay | Admitting: Internal Medicine

## 2014-05-30 ENCOUNTER — Ambulatory Visit (INDEPENDENT_AMBULATORY_CARE_PROVIDER_SITE_OTHER): Payer: Medicaid Other | Admitting: Internal Medicine

## 2014-05-30 VITALS — BP 108/82 | HR 76 | Temp 97.8°F | Ht 69.5 in | Wt 288.4 lb

## 2014-05-30 DIAGNOSIS — R1012 Left upper quadrant pain: Secondary | ICD-10-CM

## 2014-05-30 NOTE — Patient Instructions (Signed)
CT abdomen/pelvis with CM.  

## 2014-05-30 NOTE — Progress Notes (Signed)
Subjective:    Patient ID: Amber Fischer, female    DOB: 12/15/1960, 53 y.o.   MRN: [id number]  HPI She tells me for 5 weeks, she has had nausea,vomiting and diarrhea. She is having diarrhea every other day. She states the diarrhea has stopped since Tuesday. The vomiting has stopped also.  When she had the diarrhea she was having 3-5 stools a day. No melena or BRRB.   She has had 2 formed stools today. She ate 2 meals yesterday and kept it down. Appetite is better. She has not lost any weight. She has actually gained weight.  She has pain under her left breast.  She has left upper quadrant. She has had this pain for years. She says the pain radiates into her back. She says she has acid reflux since the Nexium was changed to generic which has been recently.  She is willing to give the generic of Nexium a try. She was seen in ED at [facility] about 3 weeks ago for chest pain. She says she was dehydrated and she was given fluids. She was admitted overnight to [facility]. No recent antibiotics Admitted to [facility] in September for chest pain. Her cardiac work up was negative. 5 troponin's were negative.   She has cardiac stent placed 2014 per notes (per patient)  for 98% blockage.  CMP     Component Value Date/Time   NA 139 03/19/2014 0549   NA 138 06/30/2010   K 3.9 03/19/2014 0549   CL 101 03/19/2014 0549   CO2 26 03/19/2014 0549   GLUCOSE 143* 03/19/2014 0549   BUN 18 03/19/2014 0549   CREATININE 0.91 03/19/2014 0549   CREATININE 0.79 03/13/2013 1621   CREATININE 0.7 06/30/2010   CALCIUM 9.2 03/19/2014 0549   PROT 7.0 03/19/2014 0549   ALBUMIN 3.5 03/19/2014 0549   AST 34 03/19/2014 0549   ALT 46* 03/19/2014 0549   ALKPHOS 76 03/19/2014 0549   BILITOT 0.4 03/19/2014 0549   GFRNONAA 71* 03/19/2014 0549   GFRAA 83* 03/19/2014 0549   CBC    Component Value Date/Time   WBC 6.6 03/19/2014 0549   RBC 4.09 03/19/2014 0549   HGB 11.9* 03/19/2014 0549   HCT 35.8* 03/19/2014 0549   PLT 165 03/19/2014  0549   MCV 87.5 03/19/2014 0549   MCH 29.1 03/19/2014 0549   MCHC 33.2 03/19/2014 0549   RDW 14.1 03/19/2014 0549   LYMPHSABS 2.7 03/13/2013 1621   MONOABS 0.8 03/13/2013 1621   EOSABS 0.4 03/13/2013 1621   BASOSABS 0.1 03/13/2013 1621       03/28/2012 CT abdomen/pelvis with CM: Rt upper quadrant pain:     IMPRESSION: No definite acute intra abdominal or intrapelvic abnormalities. Contrast extravasation at left antecubital fossa as above.  03/19/2014    CLINICAL DATA: 53 year old woman with history of CAD status post previous stent to the RCA, hypertension, type 2 diabetes mellitus, fibromyalgia, and recurrent chest pain. This study is requested to evaluate for the presence of ischemia.  EXAM: MYOCARDIAL IMAGING WITH SPECT (REST AND PHARMACOLOGIC-STRESS)  GATED LEFT VENTRICULAR WALL MOTION STUDY  LEFT VENTRICULAR EJECTION FRACTION  IMPRESSION: 1. Normal study without evidence of scar or ischemia. There is apical anteroseptal breast attenuation artifact.  2. No focal LV wall motion abnormalities.  3. Left ventricular ejection fraction 67%  4. Low-risk stress test findings*.    04/05/2013 EGD/ED/Colonoscopy: Dr. Laural Golden: Indications: Patient is 53 year old Caucasian female who has chronic GERD now poorly controlled and medication. She is  also complaining of solid food dysphagia which started 6 months ago. She also has noted recent onset of constipation and has had few episodes of rectal bleeding and on one occasion passed large amount of blood per rectum. Recent CBC was normal. Last colonoscopy was in July 2012 with removal of two polyps and one was an adenoma. Because of her symptoms she is worried that lesion could have been missed.  Impression:  EGD findings; Small sliding hiatal hernia otherwise normal examination. Esophageal dilation with 56 French Maloney dilator resulted in small mucosal destruction or cervical esophagus indicative of of a  web.  Colonoscopy findings; Normal colonoscopy except external hemorrhoids. No evidence of recurrent polyps.  03/23/2011 EGD:   Indications: Patient is 52 year old Caucasian female with few months history of abdominal pain with nausea and vomiting. A thorough. Ultrasound revealed fatty liver but normal size bile duct. She also had normal gastric emptying study. Transaminases have been elevated secondary to fatty liver although have been higher recently than before. She is undergoing EGD looking for peptic ulcer disease. Impression: Small sliding hiatal hernia otherwise normal EGD    02/28/2012 Gastric emptying study: IMPRESSION: Abnormal gastric retention at 2 hours compatible with an element of gastric outlet obstruction.Review of Systems Past Medical History  Diagnosis Date  . Rectal bleed 11/02/2010  . Epigastric abdominal pain 08/19/2010  . Sciatica   . Back pain, chronic   . Type 2 diabetes mellitus   . Essential hypertension, benign   . Asthma   . COPD (chronic obstructive pulmonary disease)   . Coronary atherosclerosis of native coronary artery     Prior stent RCA - patent May 2014 with otherwise nonobstructive disease  . OCD (obsessive compulsive disorder)   . Fibromyalgia     Past Surgical History  Procedure Laterality Date  . Colonoscopy w/ biopsies  12/24/2010    NUR  . Colonoscopy  11/30/2010  . Abdominal hysterectomy    . Cholecystectomy    . Esophagogastroduodenoscopy  03/22/2012    Procedure: ESOPHAGOGASTRODUODENOSCOPY (EGD);  Surgeon: Rogene Houston, MD;  [facility]: [facility];  Service: Endoscopy;  Laterality: N/A;  300  . Colonoscopy, esophagogastroduodenoscopy (egd) and esophageal dilation N/A 04/05/2013    Procedure: COLONOSCOPY, ESOPHAGOGASTRODUODENOSCOPY (EGD) AND ESOPHAGEAL DILATION;  Surgeon: Rogene Houston, MD;  Location: [facility];  Service: Endoscopy;  Laterality: N/A;    Allergies  Allergen Reactions  . Ciprofloxacin Itching and Nausea And Vomiting  . Sulfonamide Derivatives Hives  . Bee Venom Swelling and Rash  . Pravastatin Rash    "swelling in legs/arms, bumps all over"     Current Outpatient Prescriptions on File Prior to Visit  Medication Sig Dispense Refill  . albuterol (PROAIR HFA) 108 (90 BASE) MCG/ACT inhaler Inhale 2 puffs into the lungs every 6 (six) hours as needed for wheezing or shortness of breath.    Marland Kitchen aspirin EC 81 MG tablet Take 81 mg by mouth daily.    . benazepril (LOTENSIN) 10 MG tablet Take 10 mg by mouth daily.    . cetirizine (ZYRTEC) 10 MG tablet Take 10 mg by mouth daily.    . clopidogrel (PLAVIX) 75 MG tablet Take 75 mg by mouth daily.    Marland Kitchen ezetimibe (ZETIA) 10 MG tablet Take 1 tablet (10 mg total) by mouth daily. 30 tablet 6  . HYDROcodone-acetaminophen (NORCO) 7.5-325 MG per tablet Take 1 tablet by mouth every 6 (six) hours as needed for pain.    Marland Kitchen insulin aspart (NOVOLOG) 100 UNIT/ML  FlexPen Inject 0-20 Units into the skin 3 (three) times daily with meals. Based on sliding scale    . insulin glargine (LANTUS) 100 UNIT/ML injection Inject 20 Units into the skin at bedtime.    Marland Kitchen levothyroxine (SYNTHROID, LEVOTHROID) 75 MCG tablet Take 75 mcg by mouth daily.      Marland Kitchen LORazepam (ATIVAN) 0.5 MG tablet Take 0.25-0.5 mg by mouth 2 (two) times daily as needed for anxiety.    . magnesium oxide (MAG-OX) 400 MG tablet Take 400 mg by mouth daily.    . metformin (FORTAMET) 500 MG (OSM) 24 hr tablet Take 500 mg by mouth 2 (two) times daily before a meal.     . metoprolol (LOPRESSOR) 50 MG tablet Take 50 mg by mouth 2 (two) times daily.    Marland Kitchen NEXIUM 40 MG capsule TAKE ONE CAPSULE TWICE A DAY 60 capsule 5  . nitroGLYCERIN (NITROSTAT) 0.4 MG SL tablet Place 0.4 mg under the tongue every 5 (five) minutes as needed for chest pain.    . potassium chloride (K-DUR,KLOR-CON) 10 MEQ tablet Take 10 mEq  by mouth daily.     . pregabalin (LYRICA) 100 MG capsule Take 100 mg by mouth 3 (three) times daily.    . sertraline (ZOLOFT) 50 MG tablet Take 50 mg by mouth daily.     No current facility-administered medications on file prior to visit.    Past Medical History  Diagnosis Date  . Rectal bleed 11/02/2010  . Epigastric abdominal pain 08/19/2010  . Sciatica   . Back pain, chronic   . Type 2 diabetes mellitus   . Essential hypertension, benign   . Asthma   . COPD (chronic obstructive pulmonary disease)   . Coronary atherosclerosis of native coronary artery     Prior stent RCA - patent May 2014 with otherwise nonobstructive disease  . OCD (obsessive compulsive disorder)   . Fibromyalgia     Past Surgical History  Procedure Laterality Date  . Colonoscopy w/ biopsies  12/24/2010    NUR  . Colonoscopy  11/30/2010  . Abdominal hysterectomy    . Cholecystectomy    . Esophagogastroduodenoscopy  03/22/2012    Procedure: ESOPHAGOGASTRODUODENOSCOPY (EGD);  Surgeon: Rogene Houston, MD;  [facility];  Service: Endoscopy;  Laterality: N/A;  300  . Colonoscopy, esophagogastroduodenoscopy (egd) and esophageal dilation N/A 04/05/2013    Procedure: COLONOSCOPY, ESOPHAGOGASTRODUODENOSCOPY (EGD) AND ESOPHAGEAL DILATION;  Surgeon: Rogene Houston, MD;  Location: [facility];  Service: Endoscopy;  Laterality: N/A;    Allergies  Allergen Reactions  . Ciprofloxacin Itching and Nausea And Vomiting  . Sulfonamide Derivatives Hives  . Bee Venom Swelling and Rash  . Pravastatin Rash    "swelling in legs/arms, bumps all over"     Current Outpatient Prescriptions on File Prior to Visit  Medication Sig Dispense Refill  . albuterol (PROAIR HFA) 108 (90 BASE) MCG/ACT inhaler Inhale 2 puffs into the lungs every 6 (six) hours as needed for wheezing or shortness of breath.    Marland Kitchen aspirin EC 81 MG tablet Take 81 mg by mouth daily.    . benazepril (LOTENSIN) 10 MG tablet Take 10 mg by mouth  daily.    . cetirizine (ZYRTEC) 10 MG tablet Take 10 mg by mouth daily.    . clopidogrel (PLAVIX) 75 MG tablet Take 75 mg by mouth daily.    Marland Kitchen ezetimibe (ZETIA) 10 MG tablet Take 1 tablet (10 mg total) by mouth daily. 30 tablet 6  .  HYDROcodone-acetaminophen (NORCO) 7.5-325 MG per tablet Take 1 tablet by mouth every 6 (six) hours as needed for pain.    Marland Kitchen insulin aspart (NOVOLOG) 100 UNIT/ML FlexPen Inject 0-20 Units into the skin 3 (three) times daily with meals. Based on sliding scale    . insulin glargine (LANTUS) 100 UNIT/ML injection Inject 20 Units into the skin at bedtime.    Marland Kitchen levothyroxine (SYNTHROID, LEVOTHROID) 75 MCG tablet Take 75 mcg by mouth daily.      Marland Kitchen LORazepam (ATIVAN) 0.5 MG tablet Take 0.25-0.5 mg by mouth 2 (two) times daily as needed for anxiety.    . magnesium oxide (MAG-OX) 400 MG tablet Take 400 mg by mouth daily.    . metformin (FORTAMET) 500 MG (OSM) 24 hr tablet Take 500 mg by mouth 2 (two) times daily before a meal.     . metoprolol (LOPRESSOR) 50 MG tablet Take 50 mg by mouth 2 (two) times daily.    Marland Kitchen NEXIUM 40 MG capsule TAKE ONE CAPSULE TWICE A DAY 60 capsule 5  . nitroGLYCERIN (NITROSTAT) 0.4 MG SL tablet Place 0.4 mg under the tongue every 5 (five) minutes as needed for chest pain.    . potassium chloride (K-DUR,KLOR-CON) 10 MEQ tablet Take 10 mEq by mouth daily.     . pregabalin (LYRICA) 100 MG capsule Take 100 mg by mouth 3 (three) times daily.    . sertraline (ZOLOFT) 50 MG tablet Take 50 mg by mouth daily.     No current facility-administered medications on file prior to visit.        Objective:   Physical Exam  Filed Vitals:   05/30/14 1548  Height: 5' 9.5" (1.765 m)  Weight: 288 lb 6.4 oz (130.817 kg)   Alert and oriented. Skin warm and dry. Oral mucosa is moist.   . Sclera anicteric, conjunctivae is pink. Thyroid not enlarged. No cervical lymphadenopathy. Lungs clear. Heart regular rate and rhythm.  Abdomen is soft. Bowel sounds are positive. No  hepatomegaly. No abdominal masses felt. Tenderness left upper quadrant.  No edema to lower extremities.          Assessment & Plan:  Nausea and vomiting which has now resolved.  Chronic abdominal pain which she has had for years. Now the pain is in the left upper quadrant ? Etiology. Plan: CT abdomen/pelvis with CM. Separate the Nexium from the Plavix

## 2014-05-31 LAB — COMPREHENSIVE METABOLIC PANEL
ALT: 57 U/L — ABNORMAL HIGH (ref 0–35)
AST: 34 U/L (ref 0–37)
Albumin: 4.3 g/dL (ref 3.5–5.2)
Alkaline Phosphatase: 69 U/L (ref 39–117)
BILIRUBIN TOTAL: 0.9 mg/dL (ref 0.2–1.2)
BUN: 15 mg/dL (ref 6–23)
CO2: 24 meq/L (ref 19–32)
Calcium: 9.7 mg/dL (ref 8.4–10.5)
Chloride: 102 mEq/L (ref 96–112)
Creat: 0.86 mg/dL (ref 0.50–1.10)
GLUCOSE: 128 mg/dL — AB (ref 70–99)
Potassium: 4.1 mEq/L (ref 3.5–5.3)
SODIUM: 139 meq/L (ref 135–145)
TOTAL PROTEIN: 7.3 g/dL (ref 6.0–8.3)

## 2014-06-05 ENCOUNTER — Ambulatory Visit (HOSPITAL_COMMUNITY)
Admission: RE | Admit: 2014-06-05 | Discharge: 2014-06-05 | Disposition: A | Discharge status: Home / Self Care | Payer: Medicaid Other | Source: Ambulatory Visit | Attending: Internal Medicine | Admitting: Internal Medicine

## 2014-06-05 DIAGNOSIS — K5730 Diverticulosis of large intestine without perforation or abscess without bleeding: Secondary | ICD-10-CM | POA: Insufficient documentation

## 2014-06-05 DIAGNOSIS — Z90710 Acquired absence of both cervix and uterus: Secondary | ICD-10-CM | POA: Diagnosis not present

## 2014-06-05 DIAGNOSIS — R1012 Left upper quadrant pain: Secondary | ICD-10-CM | POA: Insufficient documentation

## 2014-06-05 DIAGNOSIS — Z9049 Acquired absence of other specified parts of digestive tract: Secondary | ICD-10-CM | POA: Diagnosis not present

## 2014-06-05 MED ORDER — IOHEXOL 300 MG/ML  SOLN
100.0000 mL | Freq: Once | INTRAMUSCULAR | Status: AC | PRN
  Administered 2014-06-05: 100 mL via INTRAVENOUS

## 2014-06-11 ENCOUNTER — Encounter (INDEPENDENT_AMBULATORY_CARE_PROVIDER_SITE_OTHER): Payer: Self-pay | Admitting: *Deleted

## 2014-07-09 ENCOUNTER — Encounter: Payer: Self-pay | Admitting: Cardiovascular Disease

## 2014-07-09 ENCOUNTER — Ambulatory Visit (INDEPENDENT_AMBULATORY_CARE_PROVIDER_SITE_OTHER): Payer: Medicaid Other | Admitting: Cardiovascular Disease

## 2014-07-09 ENCOUNTER — Telehealth: Payer: Self-pay | Admitting: Cardiovascular Disease

## 2014-07-09 VITALS — BP 128/84 | HR 76 | Ht 69.0 in | Wt 289.0 lb

## 2014-07-09 DIAGNOSIS — Z87898 Personal history of other specified conditions: Secondary | ICD-10-CM

## 2014-07-09 DIAGNOSIS — Z9289 Personal history of other medical treatment: Secondary | ICD-10-CM

## 2014-07-09 DIAGNOSIS — R079 Chest pain, unspecified: Secondary | ICD-10-CM

## 2014-07-09 DIAGNOSIS — I2510 Atherosclerotic heart disease of native coronary artery without angina pectoris: Secondary | ICD-10-CM

## 2014-07-09 DIAGNOSIS — E039 Hypothyroidism, unspecified: Secondary | ICD-10-CM

## 2014-07-09 DIAGNOSIS — R0602 Shortness of breath: Secondary | ICD-10-CM

## 2014-07-09 DIAGNOSIS — J438 Other emphysema: Secondary | ICD-10-CM

## 2014-07-09 DIAGNOSIS — Z794 Long term (current) use of insulin: Secondary | ICD-10-CM

## 2014-07-09 DIAGNOSIS — E119 Type 2 diabetes mellitus without complications: Secondary | ICD-10-CM

## 2014-07-09 DIAGNOSIS — Z955 Presence of coronary angioplasty implant and graft: Secondary | ICD-10-CM

## 2014-07-09 DIAGNOSIS — R109 Unspecified abdominal pain: Secondary | ICD-10-CM

## 2014-07-09 DIAGNOSIS — IMO0001 Reserved for inherently not codable concepts without codable children: Secondary | ICD-10-CM

## 2014-07-09 MED ORDER — TIOTROPIUM BROMIDE MONOHYDRATE 18 MCG IN CAPS: 18.0000 ug | ORAL_CAPSULE | Freq: Every day | RESPIRATORY_TRACT | Status: AC

## 2014-07-09 NOTE — Progress Notes (Signed)
Patient ID: Amber Fischer, female   DOB: 07-21-1960, 54 y.o.   MRN: [id number]      SUBJECTIVE: The patient presents for routine follow up for CAD. She underwent a nuclear stress test in September 2015 when she was hospitalized for chest pain which did not demonstrate any evidence of myocardial ischemia or scar. Her most recent cardiac catheterization was performed on 10/26/2012, which showed a patent RCA stent, 30% stenosis in the distal LAD, and 40% stenosis in the distal left circumflex coronary artery. Her RCA stent was placed on 09/18/2012.  She also has a history of hyperlipidemia, COPD, depression, fibromyalgia, GERD, hypertension, hypothyroidism, diabetes mellitus, obesity, and sleep apnea. She is a former smoker and quit in October 2012.  Echocardiogram on 10/03/13 showed normal LV systolic and diastolic function, EF 35-46%, with mild LVH. CXR on 05/06/14 performed at [facility] for a cough was normal. She was hospitalized at [facility] in mid-November 2015 for chest pain deemed secondary to viral gastroenteritis. I reviewed all relevant documentation.  She did not follow up with endocrinology regarding her diabetes. She has significant exertional dyspnea and had possibly been on Spiriva in the past (which had helped). She also has significant abdominal pain, bloating, and tenderness. A recent abdominal CT was unremarkable other than showing diverticulosis. She is s/p cholecystectomy. She follows with GI (Dr. Laural Golden).  She describes "electric shock" pains through the left breast and inframammary region. She does not do self breast exams and believes she is not up to date with mammograms.   Review of Systems: As per "subjective", otherwise negative.  Allergies  Allergen Reactions  . Ciprofloxacin Itching and Nausea And Vomiting  . Sulfonamide Derivatives Hives  . Bee Venom Swelling and Rash  . Pravastatin Rash    "swelling in legs/arms, bumps all over"     Current Outpatient  Prescriptions  Medication Sig Dispense Refill  . albuterol (PROAIR HFA) 108 (90 BASE) MCG/ACT inhaler Inhale 2 puffs into the lungs every 6 (six) hours as needed for wheezing or shortness of breath.    Marland Kitchen aspirin EC 81 MG tablet Take 81 mg by mouth daily.    . benazepril (LOTENSIN) 10 MG tablet Take 10 mg by mouth daily.    . cetirizine (ZYRTEC) 10 MG tablet Take 10 mg by mouth daily.    . clopidogrel (PLAVIX) 75 MG tablet Take 75 mg by mouth daily.    . ergocalciferol (VITAMIN D2) 50000 UNITS capsule Take 50,000 Units by mouth once a week.    . esomeprazole (NEXIUM) 40 MG capsule Take 40 mg by mouth daily at 12 noon.    . ezetimibe (ZETIA) 10 MG tablet Take 1 tablet (10 mg total) by mouth daily. 30 tablet 6  . HYDROcodone-acetaminophen (NORCO) 7.5-325 MG per tablet Take 1 tablet by mouth every 6 (six) hours as needed for pain.    Marland Kitchen insulin aspart (NOVOLOG) 100 UNIT/ML FlexPen Inject 0-20 Units into the skin 3 (three) times daily with meals. Based on sliding scale    . insulin glargine (LANTUS) 100 UNIT/ML injection Inject 20 Units into the skin at bedtime.    Marland Kitchen levothyroxine (SYNTHROID, LEVOTHROID) 75 MCG tablet Take 75 mcg by mouth daily.      Marland Kitchen LORazepam (ATIVAN) 0.5 MG tablet Take 0.25-0.5 mg by mouth 2 (two) times daily as needed for anxiety.    . magnesium oxide (MAG-OX) 400 MG tablet Take 400 mg by mouth daily.    . metformin (FORTAMET) 500 MG (OSM) 24 hr tablet  Take 500 mg by mouth 2 (two) times daily before a meal.     . metoprolol (LOPRESSOR) 50 MG tablet Take 50 mg by mouth 2 (two) times daily.    Marland Kitchen NEXIUM 40 MG capsule TAKE ONE CAPSULE TWICE A DAY 60 capsule 5  . nitroGLYCERIN (NITROSTAT) 0.4 MG SL tablet Place 0.4 mg under the tongue every 5 (five) minutes as needed for chest pain.    . potassium chloride (K-DUR,KLOR-CON) 10 MEQ tablet Take 10 mEq by mouth daily.     . pregabalin (LYRICA) 100 MG capsule Take 100 mg by mouth 3 (three) times daily.    . ranitidine (ZANTAC) 75 MG  tablet Take 75 mg by mouth 2 (two) times daily.    . sertraline (ZOLOFT) 50 MG tablet Take 50 mg by mouth daily.     No current facility-administered medications for this visit.    Past Medical History  Diagnosis Date  . Rectal bleed 11/02/2010  . Epigastric abdominal pain 08/19/2010  . Sciatica   . Back pain, chronic   . Type 2 diabetes mellitus   . Essential hypertension, benign   . Asthma   . COPD (chronic obstructive pulmonary disease)   . Coronary atherosclerosis of native coronary artery     Prior stent RCA - patent May 2014 with otherwise nonobstructive disease  . OCD (obsessive compulsive disorder)   . Fibromyalgia     Past Surgical History  Procedure Laterality Date  . Colonoscopy w/ biopsies  12/24/2010    NUR  . Colonoscopy  11/30/2010  . Abdominal hysterectomy    . Cholecystectomy    . Esophagogastroduodenoscopy  03/22/2012    Procedure: ESOPHAGOGASTRODUODENOSCOPY (EGD);  Surgeon: Rogene Houston, MD;  [facility];  Service: Endoscopy;  Laterality: N/A;  300  . Colonoscopy, esophagogastroduodenoscopy (egd) and esophageal dilation N/A 04/05/2013    Procedure: COLONOSCOPY, ESOPHAGOGASTRODUODENOSCOPY (EGD) AND ESOPHAGEAL DILATION;  Surgeon: Rogene Houston, MD;  Location: [facility];  Service: Endoscopy;  Laterality: N/A;    History   Social History  . Marital Status: Divorced    Spouse Name: N/A    Number of Children: N/A  . Years of Education: N/A   Occupational History  . Not on file.   Social History Main Topics  . Smoking status: Former Smoker -- 0.50 packs/day for 20 years    Types: Cigarettes    Start date: 08/16/1977    Quit date: 09/21/2011  . Smokeless tobacco: Never Used     Comment: Quit 38yr ago after smoking 12 yrs  . Alcohol Use: No     Comment: rare  . Drug Use: No  . Sexual Activity: Not on file   Other Topics Concern  . Not on file   Social History Narrative   BP 128/84  Pulse 76 SpO2 97% Weight 289 lb (131.09  kg) Height 5\' 9"  (1.753 m)    PHYSICAL EXAM General: NAD, obese Neck: No JVD, no thyromegaly or thyroid nodule.  Lungs: Clear to auscultation bilaterally with normal respiratory effort. CV: Nondisplaced PMI. Heart regular S1/S2, no S3/S4, no murmur. No peripheral edema. No carotid bruit. Normal pedal pulses.  Abdomen: Soft, mild diffuse tenderness, obese, no distention.  Skin: Intact without lesions or rashes.  Neurologic: Alert and oriented x 3.  Psych: Normal affect. Extremities: No clubbing or cyanosis.  HEENT: Normal.  Skin: Normal. Musculoskeletal: Normal range of motion, no gross deformities.  ECG: Most recent ECG reviewed.      ASSESSMENT AND  PLAN: 1. CAD with RCA stent: Stable ischemic heart disease with normal nuclear stress test in 9/15. I will continue aspirin and metoprolol. I previously d/c Plavix but she is still taking. I will d/c this. She is not able to tolerate statin therapy reportedly due to liver dysfunction. 2. Essential HTN: Controlled on current therapy which includes benazepril and metoprolol. No changes. 3. Hyperlipidemia: She is not able to tolerate statin therapy reportedly due to liver dysfunction. Currently on Zetia. Will check to see when most recent lipids were performed by PCP. 4. Insulin-dependent diabetes mellitus: I previously made a referral to endocrinology for diabetes management but she did not see one. I will make another referral.  5. Hypothyroidism: Previously made a referral to endocrinology but she did not see one. I will make another referral.  6. COPD: Will prescribe Spiriva 18 mcg inhaled once daily. 7. Abdominal tenderness: Follows with GI. S/p cholecystectomy. Possible functional etiology.  Dispo: f/u 3 months.  Time spent: 40 minutes, of which >50% discussing medications and etiology of her symptoms.  Kate Sable, M.D., F.A.C.C.

## 2014-07-09 NOTE — Patient Instructions (Signed)
Your physician recommends that you schedule a follow-up appointment in: 3 months. You will receive a reminder letter in the mail in about 1-2 months reminding you to call and schedule your appointment. If you don't receive this letter, please contact our office. Your physician has recommended you make the following change in your medication:  Stop plavix or clopidegrel. Start spiriva inhaler 18 mcg daily. Continue all other medications the same. You are being referred to Dr. Dorris Fetch.

## 2014-07-09 NOTE — Telephone Encounter (Signed)
Sent referral to Dr. Sherrie Sport requesting authorization to send patient to see Dr. Dorris Fetch. Amber Fischer is [place] [facility]. Must have approval from PCP.

## 2014-07-23 ENCOUNTER — Other Ambulatory Visit: Payer: Self-pay | Admitting: *Deleted

## 2014-07-23 MED ORDER — EZETIMIBE 10 MG PO TABS: 10.0000 mg | ORAL_TABLET | Freq: Every day | ORAL | Status: DC

## 2014-08-01 ENCOUNTER — Ambulatory Visit (INDEPENDENT_AMBULATORY_CARE_PROVIDER_SITE_OTHER): Payer: Medicaid Other | Admitting: Internal Medicine

## 2014-08-01 ENCOUNTER — Encounter (INDEPENDENT_AMBULATORY_CARE_PROVIDER_SITE_OTHER): Payer: Self-pay | Admitting: *Deleted

## 2014-08-01 ENCOUNTER — Other Ambulatory Visit (INDEPENDENT_AMBULATORY_CARE_PROVIDER_SITE_OTHER): Payer: Self-pay | Admitting: *Deleted

## 2014-08-01 ENCOUNTER — Encounter (INDEPENDENT_AMBULATORY_CARE_PROVIDER_SITE_OTHER): Payer: Self-pay | Admitting: Internal Medicine

## 2014-08-01 VITALS — BP 132/80 | HR 80 | Temp 98.1°F | Ht 69.5 in | Wt 285.4 lb

## 2014-08-01 DIAGNOSIS — R1310 Dysphagia, unspecified: Secondary | ICD-10-CM

## 2014-08-01 DIAGNOSIS — K219 Gastro-esophageal reflux disease without esophagitis: Secondary | ICD-10-CM

## 2014-08-01 DIAGNOSIS — K5909 Other constipation: Secondary | ICD-10-CM

## 2014-08-01 DIAGNOSIS — R1314 Dysphagia, pharyngoesophageal phase: Secondary | ICD-10-CM

## 2014-08-01 HISTORY — DX: Dysphagia, pharyngoesophageal phase: R13.14

## 2014-08-01 NOTE — Progress Notes (Signed)
Subjective:    Patient ID: Amber Fischer, female    DOB: [birth year], 54 y.o.   MRN: [id number]  HPI Here today for f/u.  Hx of chronic GERD, nausea and vomiting.  Gerd controlled at this time with the Nexium. No nausea or vomiting. Hx of gastroparesis.  She tells me today she is having dysphagia. She has a cough. She tells me foods are lodging. She says her mouth is dry. She says her symptoms are worse. Pills are hard to swallow. Last EGD/ED in 2014. She is coughing up mucous. Appetite is good. She is diet for weight loss. She has lost 4 pounds since her last visit in January She is eating three meals a day. No snack or sweets.  There is no nausea or vomiting.  She is not having any diarrhea now. She says she has more constipation now since she is own pain medication. She is having a BM about once every week. She is now taking a stool softener. Hx of chronic pain in her back.       04/05/2013 EGD/ED/Colonoscopy: Dr. Laural Golden: Indications: Patient is 54 year old Caucasian female who has chronic GERD now poorly controlled and medication. She is also complaining of solid food dysphagia which started 6 months ago. She also has noted recent onset of constipation and has had few episodes of rectal bleeding and on one occasion passed large amount of blood per rectum. Recent CBC was normal. Last colonoscopy was in July 2012 with removal of two polyps and one was an adenoma. Because of her symptoms she is worried that lesion could have been missed. Impression:  EGD findings; Small sliding hiatal hernia otherwise normal examination. Esophageal dilation with 56 French Maloney dilator resulted in small mucosal destruction or cervical esophagus indicative of of a web. Colonoscopy findings; Normal colonoscopy except external hemorrhoids. No evidence of recurrent polyps.  03/23/2011 EGD:  ndications: Patient is 54 year old Caucasian female with few months history of abdominal pain with nausea and  vomiting. A thorough. Ultrasound revealed fatty liver but normal size bile duct. She also had normal gastric emptying study. Transaminases have been elevated secondary to fatty liver although have been higher recently than before. She is undergoing EGD looking for peptic ulcer disease. Impression: Small sliding hiatal hernia otherwise normal EGD  02/28/2012 Gastric emptying study: IMPRESSION: Abnormal gastric retention at 2 hours compatible with an element of gastric outlet obstruction.Review of Systems  Review of Systems Past Medical History  Diagnosis Date  . Rectal bleed 11/02/2010  . Epigastric abdominal pain 08/19/2010  . Sciatica   . Back pain, chronic   . Type 2 diabetes mellitus   . Essential hypertension, benign   . Asthma   . COPD (chronic obstructive pulmonary disease)   . Coronary atherosclerosis of native coronary artery     Prior stent RCA - patent May 2014 with otherwise nonobstructive disease  . OCD (obsessive compulsive disorder)   . Fibromyalgia     Past Surgical History  Procedure Laterality Date  . Colonoscopy w/ biopsies  12/24/2010    NUR  . Colonoscopy  11/30/2010  . Abdominal hysterectomy    . Cholecystectomy    . Esophagogastroduodenoscopy  03/22/2012    Procedure: ESOPHAGOGASTRODUODENOSCOPY (EGD);  Surgeon: Rogene Houston, MD;  Location: [facility];  Service: Endoscopy;  Laterality: N/A;  300  . Colonoscopy, esophagogastroduodenoscopy (egd) and esophageal dilation N/A 04/05/2013    Procedure: COLONOSCOPY, ESOPHAGOGASTRODUODENOSCOPY (EGD) AND ESOPHAGEAL DILATION;  Surgeon: Rogene Houston, MD;  Location: [facility];  Service: Endoscopy;  Laterality: N/A;    Allergies  Allergen Reactions  . Ciprofloxacin Itching and Nausea And Vomiting  . Sulfonamide Derivatives Hives  . Bee Venom Swelling and Rash  . Pravastatin Rash    "swelling  in legs/arms, bumps all over"     Current Outpatient Prescriptions on File Prior to Visit  Medication Sig Dispense Refill  . albuterol (PROAIR HFA) 108 (90 BASE) MCG/ACT inhaler Inhale 2 puffs into the lungs every 6 (six) hours as needed for wheezing or shortness of breath.    Marland Kitchen aspirin EC 81 MG tablet Take 81 mg by mouth daily.    . benazepril (LOTENSIN) 10 MG tablet Take 10 mg by mouth daily.    . cetirizine (ZYRTEC) 10 MG tablet Take 10 mg by mouth daily.    . ergocalciferol (VITAMIN D2) 50000 UNITS capsule Take 50,000 Units by mouth once a week.    . esomeprazole (NEXIUM) 40 MG capsule Take 40 mg by mouth daily at 12 noon.    . ezetimibe (ZETIA) 10 MG tablet Take 1 tablet (10 mg total) by mouth daily. 30 tablet 6  . HYDROcodone-acetaminophen (NORCO) 7.5-325 MG per tablet Take 1 tablet by mouth every 6 (six) hours as needed for pain.    Marland Kitchen insulin glargine (LANTUS) 100 UNIT/ML injection Inject 20 Units into the skin at bedtime.    Marland Kitchen levothyroxine (SYNTHROID, LEVOTHROID) 75 MCG tablet Take 75 mcg by mouth daily.      Marland Kitchen LORazepam (ATIVAN) 0.5 MG tablet Take 0.25-0.5 mg by mouth 2 (two) times daily as needed for anxiety.    . metformin (FORTAMET) 500 MG (OSM) 24 hr tablet Take 500 mg by mouth 2 (two) times daily before a meal.     . metoprolol (LOPRESSOR) 50 MG tablet Take 50 mg by mouth 2 (two) times daily.    Marland Kitchen NEXIUM 40 MG capsule TAKE ONE CAPSULE TWICE A DAY 60 capsule 5  . nitroGLYCERIN (NITROSTAT) 0.4 MG SL tablet Place 0.4 mg under the tongue every 5 (five) minutes as needed for chest pain.    . potassium chloride (K-DUR,KLOR-CON) 10 MEQ tablet Take 10 mEq by mouth daily.     . pregabalin (LYRICA) 100 MG capsule Take 100 mg by mouth 3 (three) times daily.    . sertraline (ZOLOFT) 50 MG tablet Take 50 mg by mouth daily.    Marland Kitchen tiotropium (SPIRIVA HANDIHALER) 18 MCG inhalation capsule Place 1 capsule (18 mcg total) into inhaler and inhale daily. 30 capsule 6   No current  facility-administered medications on file prior to visit.   Divorced. Three children. Disabled.     Objective:   Physical Exam  Filed Vitals:   08/01/14 1455  Height: 5' 9.5" (1.765 m)  Weight: 285 lb 6.4 oz (129.457 kg)   Alert and oriented. Skin warm and dry. Oral mucosa is moist.   . Sclera anicteric, conjunctivae is pink. Thyroid not enlarged. No cervical lymphadenopathy. Lungs clear. Heart regular rate and rhythm.  Abdomen is soft. Bowel sounds are positive. No hepatomegaly. No abdominal masses felt. No tenderness.  No edema to lower extremities.          Assessment & Plan:  Solid and pill dysphagia. Last EGD in 2015. Esophageal web needs to be ruled out. EGD/ED. The risks and benefits such as perforation, bleeding, and infection were reviewed with the patient and is agreeable.

## 2014-08-01 NOTE — Patient Instructions (Addendum)
Benefiber daily.  EGD/ED The risks and benefits such as perforation, bleeding, and infection were reviewed with the patient and is agreeable.

## 2014-08-14 ENCOUNTER — Encounter (HOSPITAL_COMMUNITY): Payer: Self-pay | Admitting: *Deleted

## 2014-08-14 ENCOUNTER — Encounter (HOSPITAL_COMMUNITY)
Admission: RE | Disposition: A | Discharge status: Home / Self Care | Payer: Self-pay | Source: Ambulatory Visit | Attending: Internal Medicine

## 2014-08-14 ENCOUNTER — Ambulatory Visit (HOSPITAL_COMMUNITY)
Admission: RE | Admit: 2014-08-14 | Discharge: 2014-08-14 | Disposition: A | Discharge status: Home / Self Care | Payer: Medicaid Other | Source: Ambulatory Visit | Attending: Internal Medicine | Admitting: Internal Medicine

## 2014-08-14 DIAGNOSIS — K219 Gastro-esophageal reflux disease without esophagitis: Secondary | ICD-10-CM | POA: Diagnosis not present

## 2014-08-14 DIAGNOSIS — Z7982 Long term (current) use of aspirin: Secondary | ICD-10-CM | POA: Insufficient documentation

## 2014-08-14 DIAGNOSIS — Z883 Allergy status to other anti-infective agents status: Secondary | ICD-10-CM | POA: Diagnosis not present

## 2014-08-14 DIAGNOSIS — J449 Chronic obstructive pulmonary disease, unspecified: Secondary | ICD-10-CM | POA: Diagnosis not present

## 2014-08-14 DIAGNOSIS — Z882 Allergy status to sulfonamides status: Secondary | ICD-10-CM | POA: Insufficient documentation

## 2014-08-14 DIAGNOSIS — I10 Essential (primary) hypertension: Secondary | ICD-10-CM | POA: Insufficient documentation

## 2014-08-14 DIAGNOSIS — K317 Polyp of stomach and duodenum: Secondary | ICD-10-CM | POA: Insufficient documentation

## 2014-08-14 DIAGNOSIS — M797 Fibromyalgia: Secondary | ICD-10-CM | POA: Insufficient documentation

## 2014-08-14 DIAGNOSIS — I2510 Atherosclerotic heart disease of native coronary artery without angina pectoris: Secondary | ICD-10-CM | POA: Insufficient documentation

## 2014-08-14 DIAGNOSIS — J45909 Unspecified asthma, uncomplicated: Secondary | ICD-10-CM | POA: Insufficient documentation

## 2014-08-14 DIAGNOSIS — K449 Diaphragmatic hernia without obstruction or gangrene: Secondary | ICD-10-CM | POA: Diagnosis not present

## 2014-08-14 DIAGNOSIS — Q394 Esophageal web: Secondary | ICD-10-CM

## 2014-08-14 DIAGNOSIS — Z87891 Personal history of nicotine dependence: Secondary | ICD-10-CM | POA: Insufficient documentation

## 2014-08-14 DIAGNOSIS — R1310 Dysphagia, unspecified: Secondary | ICD-10-CM

## 2014-08-14 DIAGNOSIS — Z888 Allergy status to other drugs, medicaments and biological substances status: Secondary | ICD-10-CM | POA: Insufficient documentation

## 2014-08-14 DIAGNOSIS — E119 Type 2 diabetes mellitus without complications: Secondary | ICD-10-CM | POA: Insufficient documentation

## 2014-08-14 DIAGNOSIS — Z91030 Bee allergy status: Secondary | ICD-10-CM | POA: Insufficient documentation

## 2014-08-14 HISTORY — PX: ESOPHAGEAL DILATION: SHX303

## 2014-08-14 HISTORY — PX: ESOPHAGOGASTRODUODENOSCOPY: SHX5428

## 2014-08-14 LAB — GLUCOSE, CAPILLARY
GLUCOSE-CAPILLARY: 98 mg/dL (ref 70–99)
GLUCOSE-CAPILLARY: 95 mg/dL (ref 70–99)

## 2014-08-14 SURGERY — EGD (ESOPHAGOGASTRODUODENOSCOPY)
Anesthesia: Moderate Sedation

## 2014-08-14 MED ORDER — MEPERIDINE HCL 50 MG/ML IJ SOLN
INTRAMUSCULAR | Status: AC
  Filled 2014-08-14: qty 1

## 2014-08-14 MED ORDER — STERILE WATER FOR IRRIGATION IR SOLN
Status: DC | PRN
  Administered 2014-08-14: 15:00:00

## 2014-08-14 MED ORDER — SODIUM CHLORIDE 0.9 % IV SOLN
INTRAVENOUS | Status: DC
  Administered 2014-08-14: 14:00:00 via INTRAVENOUS

## 2014-08-14 MED ORDER — MIDAZOLAM HCL 5 MG/5ML IJ SOLN
INTRAMUSCULAR | Status: AC
  Filled 2014-08-14: qty 10

## 2014-08-14 MED ORDER — MIDAZOLAM HCL 5 MG/5ML IJ SOLN
INTRAMUSCULAR | Status: AC
  Filled 2014-08-14: qty 5

## 2014-08-14 MED ORDER — BUTAMBEN-TETRACAINE-BENZOCAINE 2-2-14 % EX AERO
INHALATION_SPRAY | CUTANEOUS | Status: DC | PRN
  Administered 2014-08-14: 2 via TOPICAL

## 2014-08-14 MED ORDER — MIDAZOLAM HCL 5 MG/5ML IJ SOLN
INTRAMUSCULAR | Status: DC | PRN
  Administered 2014-08-14 (×4): 2 mg via INTRAVENOUS
  Administered 2014-08-14: 3 mg via INTRAVENOUS
  Administered 2014-08-14: 1 mg via INTRAVENOUS
  Administered 2014-08-14: 2 mg via INTRAVENOUS

## 2014-08-14 MED ORDER — MEPERIDINE HCL 50 MG/ML IJ SOLN
INTRAMUSCULAR | Status: DC | PRN
  Administered 2014-08-14 (×2): 25 mg via INTRAVENOUS

## 2014-08-14 MED ORDER — METOCLOPRAMIDE HCL 10 MG PO TABS: 10.0000 mg | ORAL_TABLET | Freq: Three times a day (TID) | ORAL | Status: DC

## 2014-08-14 NOTE — Op Note (Signed)
EGD PROCEDURE REPORT  PATIENT:  Amber Fischer  MR#:  [id number] Birthdate:  12-09-1960, 54 y.o., female Endoscopist:  Dr. Rogene Houston, MD  Procedure Date: 08/14/2014  Procedure:   EGD with ED  Indications:  Patient is 54 year old Caucasian female with multiple medical problems including chronic GERD and gastroparesis is new presents with solid food dysphagia recurrent cough. She says she is also having spells of nausea and vomiting which was stopped she is having frequent regurgitation. She points to suprasternal area site of bolus obstruction.           Informed Consent:  The risks, benefits, alternatives & imponderables which include, but are not limited to, bleeding, infection, perforation, drug reaction and potential missed lesion have been reviewed.  The potential for biopsy, lesion removal, esophageal dilation, etc. have also been discussed.  Questions have been answered.  All parties agreeable.  Please see history & physical in medical record for more information.  Medications:  Demerol 50 mg IV Versed 14 mg IV Cetacaine spray topically for oropharyngeal anesthesia  Description of procedure:  The endoscope was introduced through the mouth and advanced to the second portion of the duodenum without difficulty or limitations. The mucosal surfaces were surveyed very carefully during advancement of the scope and upon withdrawal.  Findings:  Esophagus:  Mucosa of the esophagus was normal. Submucosal veins in distal esophagus were prominent but no varices noted. GE junction was unremarkable. GEJ:  39 cm Hiatus:  41 cm Stomach:  Stomach was empty and distended very well with insufflation. Folds in the proximal stomach were normal. Examination of mucosa at gastric body revealed 4 mm hyperplastic appearing polyp which was left alone. Antral mucosa was normal. Pyloric channel was patent. Tenderness fundus and cardia were examined by retroflexion of scope and were normal. Duodenum:  Normal  bulbar and post bulbar mucosa.  Therapeutic/Diagnostic Maneuvers Performed:   Esophagus was dilated by passing 56 [place] Maloney dilator to full insertion. As the dilator was withdrawn endoscope was passed again and esophageal mucosa reexamined and small linear mucosal disruption noted at cervical esophagus consistent with esophageal web.  Complications:  None  Impression: Small sliding hiatal hernia without ring or stricture formation. Prominent submucosal veins in distal esophagus but not meeting criterion for varices. Small polyp at gastric body with features of hyperplastic polyp. This was left alone. 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  Comment; Suspect her symptoms are secondary to refractory GERD due to gastroparesis. Will consider short-term trial with metoclopramide.  Recommendations:  Standard instructions given. Metoclopramide 10 mg by mouth 30 minutes before each meal. Patient was informed of potential side effects (before the procedure). I also went over potential side effects with patient's family member advised that medication be stopped if she experiences any side effects and call office. Office visit in 4 weeks.  REHMAN,NAJEEB U  08/14/2014  3:38 PM  CC: Dr. Neale Burly, MD & Dr. Rayne Du ref. provider found

## 2014-08-14 NOTE — H&P (Addendum)
Amber Fischer is an 54 y.o. female.   Chief Complaint: Patient is here for EGD and possible ED. HPI: Patient is 54 year old Caucasian female with multiple medical problems including GERD and gastroparesis who presents with solid food dysphagia 5 to six-month duration. She also has cough which is not on treatment. She feels heartburns well controlled with therapy. She denies nausea vomiting abdominal pain or melena. She has intermittent hematochezia felt to be secondary to hemorrhoids. She has chronic constipation. Last colonoscopy was at 16 months ago revealing external hemorrhoids.  Past Medical History  Diagnosis Date  . Rectal bleed 11/02/2010  . Epigastric abdominal pain 08/19/2010  . Sciatica   . Back pain, chronic   . Type 2 diabetes mellitus   . Essential hypertension, benign   . Asthma   . COPD (chronic obstructive pulmonary disease)   . Coronary atherosclerosis of native coronary artery     Prior stent RCA - patent May 2014 with otherwise nonobstructive disease  . OCD (obsessive compulsive disorder)   . Fibromyalgia     Past Surgical History  Procedure Laterality Date  . Colonoscopy w/ biopsies  12/24/2010    NUR  . Colonoscopy  11/30/2010  . Abdominal hysterectomy    . Cholecystectomy    . Esophagogastroduodenoscopy  03/22/2012    Procedure: ESOPHAGOGASTRODUODENOSCOPY (EGD);  Surgeon: Rogene Houston, MD;  [facility]: [facility];  Service: Endoscopy;  Laterality: N/A;  300  . Colonoscopy, esophagogastroduodenoscopy (egd) and esophageal dilation N/A 04/05/2013    Procedure: COLONOSCOPY, ESOPHAGOGASTRODUODENOSCOPY (EGD) AND ESOPHAGEAL DILATION;  Surgeon: Rogene Houston, MD;  Location: [facility];  Service: Endoscopy;  Laterality: N/A;    Family History  Problem Relation Age of Onset  . Multiple sclerosis Sister    Social History:  reports that she quit smoking about 2 years ago. Her smoking use included Cigarettes. She started smoking about 37 years ago. She has a 10  pack-year smoking history. She has never used smokeless tobacco. She reports that she does not drink alcohol or use illicit drugs.  Allergies:  Allergies  Allergen Reactions  . Ciprofloxacin Itching and Nausea And Vomiting  . Sulfonamide Derivatives Hives  . Bee Venom Swelling and Rash  . Pravastatin Rash    "swelling in legs/arms, bumps all over"     Medications Prior to Admission  Medication Sig Dispense Refill  . albuterol (PROAIR HFA) 108 (90 BASE) MCG/ACT inhaler Inhale 2 puffs into the lungs every 6 (six) hours as needed for wheezing or shortness of breath.    Marland Kitchen aspirin EC 81 MG tablet Take 81 mg by mouth daily.    . benazepril (LOTENSIN) 20 MG tablet Take 20 mg by mouth daily.  1  . canagliflozin (INVOKANA) 100 MG TABS tablet Take 100 mg by mouth daily.     . cetirizine (ZYRTEC) 10 MG tablet Take 10 mg by mouth daily.    . ergocalciferol (VITAMIN D2) 50000 UNITS capsule Take 50,000 Units by mouth once a week.    . ezetimibe (ZETIA) 10 MG tablet Take 1 tablet (10 mg total) by mouth daily. 30 tablet 6  . fesoterodine (TOVIAZ) 4 MG TB24 tablet Take 4 mg by mouth daily.    . hydrochlorothiazide (HYDRODIURIL) 25 MG tablet Take 25 mg by mouth daily.    Marland Kitchen HYDROcodone-acetaminophen (NORCO) 7.5-325 MG per tablet Take 1 tablet by mouth every 6 (six) hours as needed for pain.    Marland Kitchen levothyroxine (SYNTHROID, LEVOTHROID) 75 MCG tablet Take 75 mcg by  mouth daily.      Marland Kitchen LORazepam (ATIVAN) 1 MG tablet Take 1 mg by mouth 3 (three) times daily.  2  . metformin (FORTAMET) 500 MG (OSM) 24 hr tablet Take 1,000 mg by mouth 2 (two) times daily with a meal.     . metoprolol (LOPRESSOR) 50 MG tablet Take 50 mg by mouth 2 (two) times daily.    . polyethylene glycol powder (GLYCOLAX/MIRALAX) powder Take 17 g by mouth daily.  1  . potassium chloride (K-DUR,KLOR-CON) 10 MEQ tablet Take 10 mEq by mouth 2 (two) times daily.     . pregabalin (LYRICA) 100 MG capsule Take 100 mg by mouth 3 (three) times daily.     . sertraline (ZOLOFT) 100 MG tablet Take 150 mg by mouth daily.  2  . tiotropium (SPIRIVA HANDIHALER) 18 MCG inhalation capsule Place 1 capsule (18 mcg total) into inhaler and inhale daily. 30 capsule 6  . esomeprazole (NEXIUM) 40 MG capsule Take 40 mg by mouth 2 (two) times daily before a meal.     . NEXIUM 40 MG capsule TAKE ONE CAPSULE TWICE A DAY (Patient not taking: Reported on 08/05/2014) 60 capsule 5  . nitroGLYCERIN (NITROSTAT) 0.4 MG SL tablet Place 0.4 mg under the tongue every 5 (five) minutes as needed for chest pain.      Results for orders placed or performed during the hospital encounter of 08/14/14 (from the past 48 hour(s))  Glucose, capillary     Status: None   Collection Time: 08/14/14  1:38 PM  Result Value Ref Range   Glucose-Capillary 98 70 - 99 mg/dL   No results found.  ROS  Blood pressure 119/75, pulse 76, temperature 97.6 F (36.4 C), temperature source Oral, resp. rate 15, height 5' 9.5" (1.765 m), weight 280 lb (127.007 kg), SpO2 97 %. Physical Exam  Constitutional: She appears well-developed and well-nourished.  HENT:  Mouth/Throat: Oropharynx is clear and moist.  Bilateral xanthelasma over upper eyelids  Eyes: Conjunctivae are normal. No scleral icterus.  Neck: No thyromegaly present.  Cardiovascular: Normal rate, regular rhythm and normal heart sounds.   No murmur heard. Respiratory: Effort normal and breath sounds normal.  GI: Soft. She exhibits no distension and no mass. There is no tenderness.  Musculoskeletal: She exhibits no edema.  Lymphadenopathy:    She has no cervical adenopathy.  Neurological: She is alert.  Skin: Skin is warm and dry.     Assessment/Plan Solid food dysphagia in patient with chronic GERD. Chronic cough possibly secondary to GERD. EGD possible ED.  Amber Fischer U 08/14/2014, 3:08 PM

## 2014-08-14 NOTE — Discharge Instructions (Signed)
Resume usual medications and diet. Metoclopramide 10 mg by mouth 30 minutes before each meal or 3 times a day. Stop this medication if you experience any side effects and call office immediately. No driving for 24 hours. Office visit in 4 weeks. Appointment Wednesday, March 23 at 2:30PM at Dr. Olevia Perches office with [facility].   Gastrointestinal Endoscopy, Care After Refer to this sheet in the next few weeks. These instructions provide you with information on caring for yourself after your procedure. Your caregiver may also give you more specific instructions. Your treatment has been planned according to current medical practices, but problems sometimes occur. Call your caregiver if you have any problems or questions after your procedure. HOME CARE INSTRUCTIONS  If you were given medicine to help you relax (sedative), do not drive, operate machinery, or sign important documents for 24 hours.  Avoid alcohol and hot or warm beverages for the first 24 hours after the procedure.  Only take over-the-counter or prescription medicines for pain, discomfort, or fever as directed by your caregiver. You may resume taking your normal medicines unless your caregiver tells you otherwise. Ask your caregiver when you may resume taking medicines that may cause bleeding, such as aspirin, clopidogrel, or warfarin.  You may return to your normal diet and activities on the day after your procedure, or as directed by your caregiver. Walking may help to reduce any bloated feeling in your abdomen.  Drink enough fluids to keep your urine clear or pale yellow.  You may gargle with salt water if you have a sore throat. SEEK IMMEDIATE MEDICAL CARE IF:  You have severe nausea or vomiting.  You have severe abdominal pain, abdominal cramps that last longer than 6 hours, or abdominal swelling (distention).  You have severe shoulder or back pain.  You have trouble swallowing.  You have shortness of breath, your breathing  is shallow, or you are breathing faster than normal.  You have a fever or a rapid heartbeat.  You vomit blood or material that looks like coffee grounds.  You have bloody, black, or tarry stools. MAKE SURE YOU:  Understand these instructions.  Will watch your condition.  Will get help right away if you are not doing well or get worse. Document Released: 01/20/2004 Document Revised: 10/22/2013 Document Reviewed: 09/07/2011 [facility] Patient Information 2015 [place], [place]. This information is not intended to replace advice given to you by your health care provider. Make sure you discuss any questions you have with your health care provider.

## 2014-08-14 NOTE — Progress Notes (Addendum)
Spoke to Dr. Laural Golden about patient starting her new medication (Reglan ) in AM. OK per Dr. Laural Golden. Also, patient had a question about her missed AM medications. OK to take afternoon or nighttime medications and start morning medications on Thursday morning per Dr. Laural Golden. Patient verbalized understanding.Spoke to her mother, Josephina Gip Eppes-verbalized understanding as well.

## 2014-08-15 ENCOUNTER — Telehealth (INDEPENDENT_AMBULATORY_CARE_PROVIDER_SITE_OTHER): Payer: Self-pay | Admitting: *Deleted

## 2014-08-15 NOTE — Telephone Encounter (Signed)
Jaria had an EGD yesterday and has been sick every since. Starting about 830 pm on 08/14/14 with heartburn, nausea with vomiting, diarrhea nonstop. No fever but has not thermometer. Her return phone number is [phone].

## 2014-08-15 NOTE — Telephone Encounter (Signed)
Per Dr.Rehman may call in Phenergan Suppositories 25 mg 1 per rectal every 8 hours - #12 no refills. These symptoms should have nothing to do with the procedure, if this medication does not help patient should go to the ED for further evaluation.  Patient was called and made aware.

## 2014-08-15 NOTE — Telephone Encounter (Signed)
Rx was called to the [facility]

## 2014-08-16 ENCOUNTER — Encounter (HOSPITAL_COMMUNITY): Payer: Self-pay | Admitting: Internal Medicine

## 2014-09-07 ENCOUNTER — Other Ambulatory Visit (INDEPENDENT_AMBULATORY_CARE_PROVIDER_SITE_OTHER): Payer: Self-pay | Admitting: Internal Medicine

## 2014-09-11 ENCOUNTER — Ambulatory Visit (INDEPENDENT_AMBULATORY_CARE_PROVIDER_SITE_OTHER): Payer: Medicaid Other | Admitting: Internal Medicine

## 2014-09-11 ENCOUNTER — Encounter (INDEPENDENT_AMBULATORY_CARE_PROVIDER_SITE_OTHER): Payer: Self-pay | Admitting: Internal Medicine

## 2014-09-11 ENCOUNTER — Encounter (INDEPENDENT_AMBULATORY_CARE_PROVIDER_SITE_OTHER): Payer: Self-pay | Admitting: *Deleted

## 2014-09-11 VITALS — BP 134/74 | HR 76 | Temp 97.7°F | Ht 69.5 in | Wt 282.6 lb

## 2014-09-11 DIAGNOSIS — R1314 Dysphagia, pharyngoesophageal phase: Secondary | ICD-10-CM | POA: Diagnosis not present

## 2014-09-11 NOTE — Progress Notes (Signed)
Subjective:    Patient ID: Amber Fischer, female    DOB: 22-Apr-1961, 54 y.o.   MRN: [id number]  HPIHere today after recent EGD in February,. She says she does not have epigastric pain now. GERD controlled with Nexium.  She says she can hardly swallow anything.  No nausea or vomiting. No melena.  Recently underwent and EGD/ED in February She says all foods are lodging.  She is eating one meal a day.  She tells me after the procedure she had nausea.  Appetite is okay. She has lost 2.5 pounds since her last visit in February.  She takes Reglan three times a day for her gastroparesis.  No side effects from the Reglan.  She is taking the Reglan TID.  She does have dry mouth and spray it with Biotene.       08/14/2014 EGD  Chief Complaint: Patient is here for EGD and possible ED. HPI: Patient is 54 year old Caucasian female with multiple medical problems including GERD and gastroparesis who presents with solid food dysphagia 5 to six-month duration. She also has cough which is not on treatment. She feels heartburns well controlled with therapy. She denies nausea vomiting abdominal pain or melena. She has intermittent hematochezia felt to be secondary to hemorrhoids. She has chronic constipation. Last colonoscopy was at 16 months ago revealing external hemorrhoids.    Small sliding hiatal hernia without ring or stricture formation. Prominent submucosal veins in distal esophagus but not meeting criterion for varices. Small polyp at gastric body with features of hyperplastic polyp. This was left alone. Esophageal dilation performed by passing 77 French Maloney dilator and resulted in mucosal disruption at cervical esophagus consistent with esophageal     04/05/2013 EGD/ED/Colonoscopy: Dr. Laural Golden: Indications: Patient is 54 year old Caucasian female who has chronic GERD now poorly controlled and medication. She is also complaining of solid food dysphagia which started 6 months ago. She also  has noted recent onset of constipation and has had few episodes of rectal bleeding and on one occasion passed large amount of blood per rectum. Recent CBC was normal. Last colonoscopy was in July 2012 with removal of two polyps and one was an adenoma. Because of her symptoms she is worried that lesion could have been missed. Impression:  EGD findings; Small sliding hiatal hernia otherwise normal examination. Esophageal dilation with 56 French Maloney dilator resulted in small mucosal destruction or cervical esophagus indicative of of a web. Colonoscopy findings; Normal colonoscopy except external hemorrhoids. No evidence of recurrent polyps.    02/28/2012 Gastric emptying study Comparison: None.  Findings: Gastric retention at 1 hour is 100% and at 2 hours, it is 82%. Normal is less than 30% after 2 hours.  IMPRESSION: Abnormal gastric retention at 2 hours compatible with an element of gastric outlet obstruction.         Review of Systems Past Medical History  Diagnosis Date  . Rectal bleed 11/02/2010  . Epigastric abdominal pain 08/19/2010  . Sciatica   . Back pain, chronic   . Type 2 diabetes mellitus   . Essential hypertension, benign   . Asthma   . COPD (chronic obstructive pulmonary disease)   . Coronary atherosclerosis of native coronary artery     Prior stent RCA - patent May 2014 with otherwise nonobstructive disease  . OCD (obsessive compulsive disorder)   . Fibromyalgia     Past Surgical History  Procedure Laterality Date  . Colonoscopy w/ biopsies  12/24/2010    NUR  . Colonoscopy  11/30/2010  . Abdominal hysterectomy    . Cholecystectomy    . Esophagogastroduodenoscopy  03/22/2012    Procedure: ESOPHAGOGASTRODUODENOSCOPY (EGD);  Surgeon: Rogene Houston, MD;  Location: [facility];  Service: Endoscopy;  Laterality: N/A;  300  . Colonoscopy, esophagogastroduodenoscopy (egd) and esophageal dilation N/A 04/05/2013    Procedure: COLONOSCOPY,  ESOPHAGOGASTRODUODENOSCOPY (EGD) AND ESOPHAGEAL DILATION;  Surgeon: Rogene Houston, MD;  Location: [facility];  Service: Endoscopy;  Laterality: N/A;  . Esophagogastroduodenoscopy N/A 08/14/2014    Procedure: ESOPHAGOGASTRODUODENOSCOPY (EGD);  Surgeon: Rogene Houston, MD;  Location: [facility];  Service: Endoscopy;  Laterality: N/A;  225  . Esophageal dilation N/A 08/14/2014    Procedure: ESOPHAGEAL DILATION;  Surgeon: Rogene Houston, MD;  Location: [facility];  Service: Endoscopy;  Laterality: N/A;    Allergies  Allergen Reactions  . Ciprofloxacin Itching and Nausea And Vomiting  . Sulfonamide Derivatives Hives  . Bee Venom Swelling and Rash  . Pravastatin Rash    "swelling in legs/arms, bumps all over"     Current Outpatient Prescriptions on File Prior to Visit  Medication Sig Dispense Refill  . albuterol (PROAIR HFA) 108 (90 BASE) MCG/ACT inhaler Inhale 2 puffs into the lungs every 6 (six) hours as needed for wheezing or shortness of breath.    Marland Kitchen aspirin EC 81 MG tablet Take 81 mg by mouth daily.    . benazepril (LOTENSIN) 20 MG tablet Take 20 mg by mouth daily.  1  . canagliflozin (INVOKANA) 100 MG TABS tablet Take 100 mg by mouth daily.     . cetirizine (ZYRTEC) 10 MG tablet Take 10 mg by mouth daily.    . ergocalciferol (VITAMIN D2) 50000 UNITS capsule Take 50,000 Units by mouth once a week.    . esomeprazole (NEXIUM) 40 MG capsule Take 40 mg by mouth 2 (two) times daily before a meal.     . ezetimibe (ZETIA) 10 MG tablet Take 1 tablet (10 mg total) by mouth daily. 30 tablet 6  . fesoterodine (TOVIAZ) 4 MG TB24 tablet Take 4 mg by mouth daily.    . hydrochlorothiazide (HYDRODIURIL) 25 MG tablet Take 25 mg by mouth daily.    Marland Kitchen HYDROcodone-acetaminophen (NORCO) 7.5-325 MG per tablet Take 1 tablet by mouth every 6 (six) hours as needed for pain.    Marland Kitchen levothyroxine (SYNTHROID, LEVOTHROID) 75 MCG tablet Take 75 mcg by mouth daily.      Marland Kitchen LORazepam (ATIVAN) 1 MG tablet Take 1  mg by mouth 3 (three) times daily.  2  . metformin (FORTAMET) 500 MG (OSM) 24 hr tablet Take 1,000 mg by mouth 2 (two) times daily with a meal.     . metoCLOPramide (REGLAN) 10 MG tablet TAKE 1 TABLET (10 MG TOTAL) BY MOUTH 3 (THREE) TIMES DAILY BEFORE MEALS. 90 tablet 0  . metoprolol (LOPRESSOR) 50 MG tablet Take 50 mg by mouth 2 (two) times daily.    . nitroGLYCERIN (NITROSTAT) 0.4 MG SL tablet Place 0.4 mg under the tongue every 5 (five) minutes as needed for chest pain.    . polyethylene glycol powder (GLYCOLAX/MIRALAX) powder Take 17 g by mouth daily.  1  . potassium chloride (K-DUR,KLOR-CON) 10 MEQ tablet Take 10 mEq by mouth 2 (two) times daily.     . pregabalin (LYRICA) 100 MG capsule Take 100 mg by mouth 3 (three) times daily.    . sertraline (ZOLOFT) 100 MG tablet Take 150 mg by mouth daily.  2  .  tiotropium (SPIRIVA HANDIHALER) 18 MCG inhalation capsule Place 1 capsule (18 mcg total) into inhaler and inhale daily. 30 capsule 6   No current facility-administered medications on file prior to visit.        Objective:   Physical Exam Blood pressure 134/74, pulse 76, temperature 97.7 F (36.5 C), height 5' 9.5" (1.765 m), weight 282 lb 9.6 oz (128.187 kg). Alert and oriented. Skin warm and dry. Oral mucosa is moist.   . Sclera anicteric, conjunctivae is pink. Thyroid not enlarged. No cervical lymphadenopathy. Lungs clear. Heart regular rate and rhythm.  Abdomen is soft. Bowel sounds are positive. No hepatomegaly. No abdominal masses felt. No tenderness.  No edema to lower extremities.          Assessment & Plan:  Dysphagia. She continues to have dysphagia on a daily basis. Continue the Reglan. She is not having any side effects from the Reglan.  DG esophagram. Further recommendations to follow.

## 2014-09-11 NOTE — Patient Instructions (Signed)
DG Esophagram. Further recommendation once we have this back.

## 2014-09-18 ENCOUNTER — Encounter (HOSPITAL_COMMUNITY): Payer: Self-pay | Admitting: Emergency Medicine

## 2014-09-18 ENCOUNTER — Emergency Department (HOSPITAL_COMMUNITY): Payer: Medicaid Other

## 2014-09-18 ENCOUNTER — Observation Stay (HOSPITAL_COMMUNITY)
Admission: EM | Admit: 2014-09-18 | Discharge: 2014-09-20 | Disposition: A | Discharge status: Home / Self Care | Payer: Medicaid Other | Attending: Internal Medicine | Admitting: Internal Medicine

## 2014-09-18 DIAGNOSIS — E669 Obesity, unspecified: Secondary | ICD-10-CM | POA: Insufficient documentation

## 2014-09-18 DIAGNOSIS — K3184 Gastroparesis: Secondary | ICD-10-CM | POA: Diagnosis not present

## 2014-09-18 DIAGNOSIS — Z23 Encounter for immunization: Secondary | ICD-10-CM | POA: Diagnosis not present

## 2014-09-18 DIAGNOSIS — Z7982 Long term (current) use of aspirin: Secondary | ICD-10-CM | POA: Diagnosis not present

## 2014-09-18 DIAGNOSIS — I959 Hypotension, unspecified: Secondary | ICD-10-CM | POA: Diagnosis present

## 2014-09-18 DIAGNOSIS — G8929 Other chronic pain: Secondary | ICD-10-CM | POA: Insufficient documentation

## 2014-09-18 DIAGNOSIS — M5430 Sciatica, unspecified side: Secondary | ICD-10-CM | POA: Insufficient documentation

## 2014-09-18 DIAGNOSIS — Z79899 Other long term (current) drug therapy: Secondary | ICD-10-CM | POA: Diagnosis not present

## 2014-09-18 DIAGNOSIS — I2510 Atherosclerotic heart disease of native coronary artery without angina pectoris: Secondary | ICD-10-CM | POA: Diagnosis present

## 2014-09-18 DIAGNOSIS — R0789 Other chest pain: Secondary | ICD-10-CM

## 2014-09-18 DIAGNOSIS — Z87891 Personal history of nicotine dependence: Secondary | ICD-10-CM | POA: Insufficient documentation

## 2014-09-18 DIAGNOSIS — E860 Dehydration: Secondary | ICD-10-CM | POA: Diagnosis not present

## 2014-09-18 DIAGNOSIS — F42 Obsessive-compulsive disorder: Secondary | ICD-10-CM | POA: Diagnosis not present

## 2014-09-18 DIAGNOSIS — R55 Syncope and collapse: Secondary | ICD-10-CM

## 2014-09-18 DIAGNOSIS — R1314 Dysphagia, pharyngoesophageal phase: Secondary | ICD-10-CM | POA: Diagnosis not present

## 2014-09-18 DIAGNOSIS — I9589 Other hypotension: Secondary | ICD-10-CM | POA: Diagnosis not present

## 2014-09-18 DIAGNOSIS — I951 Orthostatic hypotension: Secondary | ICD-10-CM

## 2014-09-18 DIAGNOSIS — I10 Essential (primary) hypertension: Secondary | ICD-10-CM | POA: Diagnosis not present

## 2014-09-18 DIAGNOSIS — J449 Chronic obstructive pulmonary disease, unspecified: Secondary | ICD-10-CM | POA: Insufficient documentation

## 2014-09-18 DIAGNOSIS — E119 Type 2 diabetes mellitus without complications: Secondary | ICD-10-CM | POA: Insufficient documentation

## 2014-09-18 DIAGNOSIS — K219 Gastro-esophageal reflux disease without esophagitis: Secondary | ICD-10-CM | POA: Diagnosis present

## 2014-09-18 DIAGNOSIS — R42 Dizziness and giddiness: Secondary | ICD-10-CM | POA: Diagnosis present

## 2014-09-18 LAB — CBC WITH DIFFERENTIAL/PLATELET
BASOS ABS: 0.1 10*3/uL (ref 0.0–0.1)
BASOS PCT: 2 % — AB (ref 0–1)
EOS ABS: 0.3 10*3/uL (ref 0.0–0.7)
EOS PCT: 4 % (ref 0–5)
HCT: 40.1 % (ref 36.0–46.0)
Hemoglobin: 13.2 g/dL (ref 12.0–15.0)
LYMPHS ABS: 2.8 10*3/uL (ref 0.7–4.0)
Lymphocytes Relative: 36 % (ref 12–46)
MCH: 28.7 pg (ref 26.0–34.0)
MCHC: 32.9 g/dL (ref 30.0–36.0)
MCV: 87.2 fL (ref 78.0–100.0)
MONOS PCT: 10 % (ref 3–12)
Monocytes Absolute: 0.8 10*3/uL (ref 0.1–1.0)
NEUTROS ABS: 3.8 10*3/uL (ref 1.7–7.7)
Neutrophils Relative %: 48 % (ref 43–77)
PLATELETS: 208 10*3/uL (ref 150–400)
RBC: 4.6 MIL/uL (ref 3.87–5.11)
RDW: 13.8 % (ref 11.5–15.5)
WBC: 7.8 10*3/uL (ref 4.0–10.5)

## 2014-09-18 LAB — URINALYSIS, ROUTINE W REFLEX MICROSCOPIC
BILIRUBIN URINE: NEGATIVE
GLUCOSE, UA: 500 mg/dL — AB
HGB URINE DIPSTICK: NEGATIVE
Ketones, ur: NEGATIVE mg/dL
Nitrite: NEGATIVE
PH: 5.5 (ref 5.0–8.0)
PROTEIN: NEGATIVE mg/dL
Specific Gravity, Urine: >1.03 — ABNORMAL HIGH (ref 1.005–1.030)
Urobilinogen, UA: 0.2 mg/dL (ref 0.0–1.0)

## 2014-09-18 LAB — BASIC METABOLIC PANEL
Anion gap: 9 (ref 5–15)
BUN: 35 mg/dL — AB (ref 6–23)
CO2: 22 mmol/L (ref 19–32)
CREATININE: 1.46 mg/dL — AB (ref 0.50–1.10)
Calcium: 9.4 mg/dL (ref 8.4–10.5)
Chloride: 104 mmol/L (ref 96–112)
GFR calc Af Amer: 46 mL/min — ABNORMAL LOW (ref >90)
GFR calc non Af Amer: 40 mL/min — ABNORMAL LOW (ref >90)
GLUCOSE: 115 mg/dL — AB (ref 70–99)
POTASSIUM: 4.2 mmol/L (ref 3.5–5.1)
Sodium: 135 mmol/L (ref 135–145)

## 2014-09-18 LAB — TROPONIN I: Troponin I: <0.03 ng/mL (ref <0.031)

## 2014-09-18 LAB — TSH: TSH: 2.723 u[IU]/mL (ref 0.350–4.500)

## 2014-09-18 LAB — GLUCOSE, CAPILLARY: Glucose-Capillary: 110 mg/dL — ABNORMAL HIGH (ref 70–99)

## 2014-09-18 LAB — URINE MICROSCOPIC-ADD ON

## 2014-09-18 LAB — CBG MONITORING, ED: Glucose-Capillary: 114 mg/dL — ABNORMAL HIGH (ref 70–99)

## 2014-09-18 MED ORDER — LORATADINE 10 MG PO TABS
10.0000 mg | ORAL_TABLET | Freq: Every day | ORAL | Status: DC
  Administered 2014-09-19 – 2014-09-20 (×2): 10 mg via ORAL
  Filled 2014-09-18 (×2): qty 1

## 2014-09-18 MED ORDER — SODIUM CHLORIDE 0.9 % IV SOLN
INTRAVENOUS | Status: DC
  Administered 2014-09-18 – 2014-09-19 (×3): via INTRAVENOUS

## 2014-09-18 MED ORDER — TIOTROPIUM BROMIDE MONOHYDRATE 18 MCG IN CAPS
18.0000 ug | ORAL_CAPSULE | Freq: Every day | RESPIRATORY_TRACT | Status: DC
  Administered 2014-09-19 – 2014-09-20 (×2): 18 ug via RESPIRATORY_TRACT
  Filled 2014-09-18: qty 5

## 2014-09-18 MED ORDER — PREGABALIN 50 MG PO CAPS
100.0000 mg | ORAL_CAPSULE | Freq: Three times a day (TID) | ORAL | Status: DC
  Administered 2014-09-18 – 2014-09-20 (×5): 100 mg via ORAL
  Filled 2014-09-18 (×5): qty 2

## 2014-09-18 MED ORDER — HYDROCODONE-ACETAMINOPHEN 7.5-325 MG PO TABS
1.0000 | ORAL_TABLET | Freq: Four times a day (QID) | ORAL | Status: DC | PRN
Start: 2014-09-18 — End: 2014-09-20
  Administered 2014-09-19: 1 via ORAL
  Filled 2014-09-18: qty 1

## 2014-09-18 MED ORDER — INFLUENZA VAC SPLIT QUAD 0.5 ML IM SUSY
0.5000 mL | PREFILLED_SYRINGE | INTRAMUSCULAR | Status: AC
  Administered 2014-09-19: 0.5 mL via INTRAMUSCULAR
  Filled 2014-09-18: qty 0.5

## 2014-09-18 MED ORDER — SODIUM CHLORIDE 0.9 % IJ SOLN
3.0000 mL | Freq: Two times a day (BID) | INTRAMUSCULAR | Status: DC
  Administered 2014-09-19: 3 mL via INTRAVENOUS

## 2014-09-18 MED ORDER — FESOTERODINE FUMARATE ER 4 MG PO TB24
4.0000 mg | ORAL_TABLET | Freq: Every day | ORAL | Status: DC
  Administered 2014-09-19 – 2014-09-20 (×2): 4 mg via ORAL
  Filled 2014-09-18 (×5): qty 1

## 2014-09-18 MED ORDER — EZETIMIBE 10 MG PO TABS
10.0000 mg | ORAL_TABLET | Freq: Every day | ORAL | Status: DC
  Administered 2014-09-19 – 2014-09-20 (×2): 10 mg via ORAL
  Filled 2014-09-18 (×2): qty 1

## 2014-09-18 MED ORDER — NITROGLYCERIN 0.4 MG SL SUBL: 0.4000 mg | SUBLINGUAL_TABLET | SUBLINGUAL | Status: DC | PRN

## 2014-09-18 MED ORDER — SIMETHICONE 80 MG PO CHEW
160.0000 mg | CHEWABLE_TABLET | Freq: Four times a day (QID) | ORAL | Status: DC | PRN
  Administered 2014-09-18 – 2014-09-19 (×2): 160 mg via ORAL
  Filled 2014-09-18 (×2): qty 2

## 2014-09-18 MED ORDER — ASPIRIN EC 81 MG PO TBEC
81.0000 mg | DELAYED_RELEASE_TABLET | Freq: Every day | ORAL | Status: DC
  Administered 2014-09-19 – 2014-09-20 (×2): 81 mg via ORAL
  Filled 2014-09-18 (×2): qty 1

## 2014-09-18 MED ORDER — SERTRALINE HCL 50 MG PO TABS
100.0000 mg | ORAL_TABLET | Freq: Every day | ORAL | Status: DC
  Administered 2014-09-19 – 2014-09-20 (×2): 100 mg via ORAL
  Filled 2014-09-18 (×2): qty 2

## 2014-09-18 MED ORDER — PANTOPRAZOLE SODIUM 40 MG PO TBEC
40.0000 mg | DELAYED_RELEASE_TABLET | Freq: Every day | ORAL | Status: DC
  Administered 2014-09-19 – 2014-09-20 (×2): 40 mg via ORAL
  Filled 2014-09-18 (×2): qty 1

## 2014-09-18 MED ORDER — ENSURE ENLIVE PO LIQD: 237.0000 mL | Freq: Two times a day (BID) | ORAL | Status: DC

## 2014-09-18 MED ORDER — SODIUM CHLORIDE 0.9 % IV BOLUS (SEPSIS)
1000.0000 mL | Freq: Once | INTRAVENOUS | Status: AC
  Administered 2014-09-18: 1000 mL via INTRAVENOUS

## 2014-09-18 MED ORDER — CANAGLIFLOZIN 100 MG PO TABS
100.0000 mg | ORAL_TABLET | Freq: Every day | ORAL | Status: DC
  Administered 2014-09-19 – 2014-09-20 (×2): 100 mg via ORAL
  Filled 2014-09-18 (×5): qty 1

## 2014-09-18 MED ORDER — METOCLOPRAMIDE HCL 10 MG PO TABS
10.0000 mg | ORAL_TABLET | Freq: Three times a day (TID) | ORAL | Status: DC
  Administered 2014-09-19 – 2014-09-20 (×5): 10 mg via ORAL
  Filled 2014-09-18 (×5): qty 1

## 2014-09-18 MED ORDER — ERGOCALCIFEROL 1.25 MG (50000 UT) PO CAPS
50000.0000 [IU] | ORAL_CAPSULE | ORAL | Status: DC
  Filled 2014-09-18: qty 1

## 2014-09-18 MED ORDER — ALBUTEROL SULFATE (2.5 MG/3ML) 0.083% IN NEBU: 3.0000 mL | INHALATION_SOLUTION | Freq: Four times a day (QID) | RESPIRATORY_TRACT | Status: DC | PRN

## 2014-09-18 MED ORDER — HEPARIN SODIUM (PORCINE) 5000 UNIT/ML IJ SOLN
5000.0000 [IU] | Freq: Three times a day (TID) | INTRAMUSCULAR | Status: DC
  Administered 2014-09-18 – 2014-09-20 (×5): 5000 [IU] via SUBCUTANEOUS
  Filled 2014-09-18 (×5): qty 1

## 2014-09-18 MED ORDER — LEVOTHYROXINE SODIUM 75 MCG PO TABS
75.0000 ug | ORAL_TABLET | Freq: Every day | ORAL | Status: DC
  Administered 2014-09-19 – 2014-09-20 (×2): 75 ug via ORAL
  Filled 2014-09-18 (×2): qty 1

## 2014-09-18 NOTE — H&P (Signed)
[facility] History and Physical  [name] KZL:[id number] DOB: 1961/02/04 DOA: 09/18/2014  Referring physician:  Dr Dina Rich of ED. PCP: Neale Burly, MD   Chief Complaint: slight headedness, presyncope, and hypotension on BP meds and diuretic.   HPI: Amber Fischer is a 54 y.o. female with hx of HTN, known CAD, s/p cardiac stent placement over a year ago, no longer on Plavix, Hx of HTN who has been on ARB and betablocker along with diuretic, hx of orthostatic hypotension, has not been on chronic steroids, hx of COPD, DM2, COPD and asthma, OCD, presented to the ER as she has been feeling lightheaded, along with having dysphagia and chest discomfort when she swallow.  She was scheduled to have EGD and possible esosphageal dilatation tomorrow.  She denied fever, chills, coughs, GU symptoms, aches, HA, Vx problems, or neurological symptoms.  Evaluation in the ER included a BP 86, HR 56, BS of 115, Cr 1.4, and normal Hb.  EKG showed SNR at 62 with no acute ST T changes, and CXR showed no infiltrates. Her UA was negative as well.  She was given IVF, and she felt better.  When I saw her, she was sitting up eating her dinner, and converse meaningfully.  Hospitalist was asked to admit her for further observation and Tx.    Review of Systems:  Constitutional:  No weight loss, night sweats, Fevers, chills, fatigue.  HEENT:  No headaches, Difficulty swallowing,Tooth/dental problems,Sore throat,  No sneezing, itching, ear ache, nasal congestion, post nasal drip,  Cardio-vascular:   Orthopnea, PND, swelling in lower extremities, anasarca, dizziness, palpitations  GI:  indigestion, abdominal pain, nausea, vomiting, diarrhea, change in bowel habits, loss of appetite  Resp:  No shortness of breath with exertion or at rest. No excess mucus, no productive cough, No non-productive cough, No coughing up of blood.No change in color of mucus.No wheezing.No chest wall deformity  Skin:  no rash or  lesions.  GU:  no dysuria, change in color of urine, no urgency or frequency. No flank pain.  Musculoskeletal:  No joint pain or swelling. No decreased range of motion. No back pain.  Psych:  No change in mood or affect. No depression or anxiety. No memory loss.   Past Medical History  Diagnosis Date  . Rectal bleed 11/02/2010  . Epigastric abdominal pain 08/19/2010  . Sciatica   . Back pain, chronic   . Type 2 diabetes mellitus   . Essential hypertension, benign   . Asthma   . COPD (chronic obstructive pulmonary disease)   . Coronary atherosclerosis of native coronary artery     Prior stent RCA - patent May 2014 with otherwise nonobstructive disease  . OCD (obsessive compulsive disorder)   . Fibromyalgia    Past Surgical History  Procedure Laterality Date  . Colonoscopy w/ biopsies  12/24/2010    NUR  . Colonoscopy  11/30/2010  . Abdominal hysterectomy    . Cholecystectomy    . Esophagogastroduodenoscopy  03/22/2012    Procedure: ESOPHAGOGASTRODUODENOSCOPY (EGD);  Surgeon: Rogene Houston, MD;  Location: [facility];  Service: Endoscopy;  Laterality: N/A;  300  . Colonoscopy, esophagogastroduodenoscopy (egd) and esophageal dilation N/A 04/05/2013    Procedure: COLONOSCOPY, ESOPHAGOGASTRODUODENOSCOPY (EGD) AND ESOPHAGEAL DILATION;  Surgeon: Rogene Houston, MD;  [facility]: [facility];  Service: Endoscopy;  Laterality: N/A;  . Esophagogastroduodenoscopy N/A 08/14/2014    Procedure: ESOPHAGOGASTRODUODENOSCOPY (EGD);  Surgeon: Rogene Houston, MD;  [facility]: [facility];  Service: Endoscopy;  Laterality: N/A;  225  . Esophageal dilation N/A 08/14/2014    Procedure: ESOPHAGEAL DILATION;  Surgeon: Rogene Houston, MD;  Location: [facility];  Service: Endoscopy;  Laterality: N/A;   Social History:  reports that she quit smoking about 2 years ago. Her smoking use included Cigarettes. She started smoking about 37 years ago. She has a 10 pack-year smoking history. She has never  used smokeless tobacco. She reports that she does not drink alcohol or use illicit drugs.  Allergies  Allergen Reactions  . Ciprofloxacin Itching and Nausea And Vomiting  . Sulfonamide Derivatives Hives  . Bee Venom Swelling and Rash  . Pravastatin Rash    "swelling in legs/arms, bumps all over"     Family History  Problem Relation Age of Onset  . Multiple sclerosis Sister     Prior to Admission medications   Medication Sig Start Date End Date Taking? Authorizing Provider  albuterol (PROAIR HFA) 108 (90 BASE) MCG/ACT inhaler Inhale 2 puffs into the lungs every 6 (six) hours as needed for wheezing or shortness of breath.   Yes Historical Provider, MD  aspirin EC 81 MG tablet Take 81 mg by mouth daily.   Yes Historical Provider, MD  benazepril (LOTENSIN) 20 MG tablet Take 20 mg by mouth daily. 07/16/14  Yes Historical Provider, MD  canagliflozin (INVOKANA) 100 MG TABS tablet Take 100 mg by mouth daily.    Yes Historical Provider, MD  cetirizine (ZYRTEC) 10 MG tablet Take 10 mg by mouth daily.   Yes Historical Provider, MD  ergocalciferol (VITAMIN D2) 50000 UNITS capsule Take 50,000 Units by mouth once a week.   Yes Historical Provider, MD  esomeprazole (NEXIUM) 40 MG capsule Take 40 mg by mouth 2 (two) times daily before a meal.    Yes Historical Provider, MD  ezetimibe (ZETIA) 10 MG tablet Take 1 tablet (10 mg total) by mouth daily. 07/23/14  Yes Herminio Commons, MD  fesoterodine (TOVIAZ) 4 MG TB24 tablet Take 4 mg by mouth daily.   Yes Historical Provider, MD  hydrochlorothiazide (HYDRODIURIL) 25 MG tablet Take 25 mg by mouth daily.   Yes Historical Provider, MD  HYDROcodone-acetaminophen (NORCO) 7.5-325 MG per tablet Take 1 tablet by mouth every 6 (six) hours as needed for moderate pain or severe pain.    Yes Historical Provider, MD  hydrOXYzine (VISTARIL) 25 MG capsule Take 25 mg by mouth 3 (three) times daily as needed. FOR ITCHING 08/29/14  Yes Historical Provider, MD  levothyroxine  (SYNTHROID, LEVOTHROID) 75 MCG tablet Take 75 mcg by mouth daily.     Yes Historical Provider, MD  LORazepam (ATIVAN) 1 MG tablet Take 1 mg by mouth 3 (three) times daily. 06/26/14  Yes Historical Provider, MD  metoCLOPramide (REGLAN) 10 MG tablet TAKE 1 TABLET (10 MG TOTAL) BY MOUTH 3 (THREE) TIMES DAILY BEFORE MEALS. 09/10/14  Yes [name], NP  metoprolol (LOPRESSOR) 50 MG tablet Take 50 mg by mouth 2 (two) times daily.   Yes Historical Provider, MD  nitroGLYCERIN (NITROSTAT) 0.4 MG SL tablet Place 0.4 mg under the tongue every 5 (five) minutes as needed for chest pain.   Yes Historical Provider, MD  polyethylene glycol powder (GLYCOLAX/MIRALAX) powder Take 17 g by mouth daily. 07/26/14  Yes Historical Provider, MD  potassium chloride (K-DUR,KLOR-CON) 10 MEQ tablet Take 10 mEq by mouth 2 (two) times daily.    Yes Historical Provider, MD  pregabalin (LYRICA) 100 MG capsule Take 100 mg by mouth 3 (three)  times daily.   Yes Historical Provider, MD  sertraline (ZOLOFT) 100 MG tablet Take 100 mg by mouth daily.  07/15/14  Yes Historical Provider, MD  tiotropium (SPIRIVA HANDIHALER) 18 MCG inhalation capsule Place 1 capsule (18 mcg total) into inhaler and inhale daily. 07/09/14  Yes Herminio Commons, MD  triamcinolone lotion (KENALOG) 0.1 % Apply 1 application topically 2 (two) times daily. 08/29/14  Yes Historical Provider, MD   Physical Exam: Filed Vitals:   09/18/14 1600 09/18/14 1809 09/18/14 1830 09/18/14 1900  BP: 91/49 85/48 88/43  84/52  Pulse: 55 55 56 56  Temp:  97.8 F (36.6 C)    TempSrc:      Resp: 16 16 12 18   Height:      Weight:      SpO2: 94% 97% 96% 96%    Wt Readings from Last 3 Encounters:  09/18/14 127.007 kg (280 lb)  09/11/14 128.187 kg (282 lb 9.6 oz)  08/14/14 127.007 kg (280 lb)    General:  Appears calm and comfortable Eyes: PERRL, normal lids, irises & conjunctiva ENT: grossly normal hearing, lips & tongue Neck: no LAD, masses or thyromegaly Cardiovascular:  RRR, no m/r/g. No Sybella Harnish edema. Telemetry: SR, no arrhythmias  Respiratory: CTA bilaterally, no w/r/r. Normal respiratory effort. Abdomen: soft, ntnd Skin: no rash or induration seen on limited exam Musculoskeletal: grossly normal tone BUE/BLE Psychiatric: grossly normal mood and affect, speech fluent and appropriate Neurologic: grossly non-focal.          Labs on Admission:  Basic Metabolic Panel:  Recent Labs Lab 09/18/14 1515  NA 135  K 4.2  CL 104  CO2 22  GLUCOSE 115*  BUN 35*  CREATININE 1.46*  CALCIUM 9.4    Recent Labs Lab 09/18/14 1515  WBC 7.8  NEUTROABS 3.8  HGB 13.2  HCT 40.1  MCV 87.2  PLT 208   Cardiac Enzymes:  Recent Labs Lab 09/18/14 1515  TROPONINI <0.03    CBG:  Recent Labs Lab 09/18/14 1502  GLUCAP 114*    Radiological Exams on Admission: Dg Chest 2 View  09/18/2014   CLINICAL DATA:  weakness, hypotension, dizziness, and diarrhea x 2 days, Hx diabetes, HTN, asthma, COPD, former smoker  EXAM: CHEST - 2 VIEW  COMPARISON:  CT 06/05/2014 and earlier studies  FINDINGS: Lungs are clear. Heart size and mediastinal contours are within normal limits. No effusion. Visualized skeletal structures are unremarkable.  IMPRESSION: No acute cardiopulmonary disease.   Electronically Signed   By: Lucrezia Europe M.D.   On: 09/18/2014 15:44    EKG: Independently reviewed.   Assessment/Plan Active Problems:   Essential hypertension, benign   Gastroparesis   GERD (gastroesophageal reflux disease)   Coronary atherosclerosis of native coronary artery   Dysphagia, pharyngoesophageal phase   Chest pain, atypical   Pre-syncope   Hypotension   1. Hypotension:  Will d/c her BP meds including betablocker, ARB, and diuretics.  She has slight elevation of her Cr, and will be given gentle IVF and repeat Cr in the am.  I will check an am cortisol level, though she has not been on steroids, and has no other sign or symptoms of adrenal insufficiency.  Will cycle her  troponin, though it is likely her CP was from her dysphagia.  Her GI procedure may need to be postpone for now.  For her DM, will continue with her Janett Billow, and will use SSI.  Her CAD is stable, and will continue with her ASA.  WIll obtain  her TSH as well.  She is stable, full  Code, and will be admitted to [facility] service.  Thank you for letting me participate in her care.    Code Status: FULL CODE. DVT Prophylaxis: SubQ hep Family Communication: Updated plan with her mother. Patient lives alone.  Disposition Plan: one day.   Time spent: 60 min.   Khale Nigh [facility]

## 2014-09-18 NOTE — ED Provider Notes (Signed)
CSN: [id number]     Arrival date & time 09/18/14  1431 History  This chart was scribed for Merryl Hacker, MD by Einar Pheasant, Medical Scribe. This patient was seen in room APA15/APA15 and the patient's care was started at 2:46 PM.      Chief Complaint  Patient presents with  . Hypotension   The history is provided by the patient and medical records. No language interpreter was used.   HPI Comments: Amber Fischer is a 54 y.o. female with PMhx of type 2 DM and COPD presents to the Emergency Department complaining of sudden onset intermittent episodes of light-headedness for the past 3 day. Pt endorses similar episodes of this light-headedness in past which resulted in a really bad fall requiring admission to [facility]). She was told that her symptoms were being caused by low blood pressure. Pt states that with her current episode she has associated weakness in bilateral feet and her symptoms are exacerbated by movement. She also endorses associated near-syncopal episodes especially when trying to walk during these light-headedness spells. Pt admits to associated chest pressure yesterday which gradually resolved on its own. She denies any fever, chills, nausea, emesis or diaphoresis. Patient denies lateralizing weakness, numbness, tingling. Patient denies room spinning dizziness. Pt states that her insulin dosage was changed by her endocrinologist approximately 2-3 months ago.   Past Medical History  Diagnosis Date  . Rectal bleed 11/02/2010  . Epigastric abdominal pain 08/19/2010  . Sciatica   . Back pain, chronic   . Type 2 diabetes mellitus   . Essential hypertension, benign   . Asthma   . COPD (chronic obstructive pulmonary disease)   . Coronary atherosclerosis of native coronary artery     Prior stent RCA - patent May 2014 with otherwise nonobstructive disease  . OCD (obsessive compulsive disorder)   . Fibromyalgia    Past Surgical History  Procedure Laterality Date  .  Colonoscopy w/ biopsies  12/24/2010    NUR  . Colonoscopy  11/30/2010  . Abdominal hysterectomy    . Cholecystectomy    . Esophagogastroduodenoscopy  03/22/2012    Procedure: ESOPHAGOGASTRODUODENOSCOPY (EGD);  Surgeon: Rogene Houston, MD;  Location: [facility];  Service: Endoscopy;  Laterality: N/A;  300  . Colonoscopy, esophagogastroduodenoscopy (egd) and esophageal dilation N/A 04/05/2013    Procedure: COLONOSCOPY, ESOPHAGOGASTRODUODENOSCOPY (EGD) AND ESOPHAGEAL DILATION;  Surgeon: Rogene Houston, MD;  Location: [facility];  Service: Endoscopy;  Laterality: N/A;  . Esophagogastroduodenoscopy N/A 08/14/2014    Procedure: ESOPHAGOGASTRODUODENOSCOPY (EGD);  Surgeon: Rogene Houston, MD;  Location: [facility];  Service: Endoscopy;  Laterality: N/A;  225  . Esophageal dilation N/A 08/14/2014    Procedure: ESOPHAGEAL DILATION;  Surgeon: Rogene Houston, MD;  Location: [facility];  Service: Endoscopy;  Laterality: N/A;   Family History  Problem Relation Age of Onset  . Multiple sclerosis Sister    History  Substance Use Topics  . Smoking status: Former Smoker -- 0.50 packs/day for 20 years    Types: Cigarettes    Start date: 08/16/1977    Quit date: 09/21/2011  . Smokeless tobacco: Never Used     Comment: Quit 8yr ago after smoking 12 yrs  . Alcohol Use: No     Comment: rare   OB History    No data available     Review of Systems  Constitutional: Negative for fever.  Eyes: Negative for visual disturbance.  Respiratory: Negative for cough, chest tightness and  shortness of breath.   Cardiovascular: Positive for chest pain. Negative for leg swelling.  Gastrointestinal: Negative for nausea, vomiting and abdominal pain.  Genitourinary: Negative for dysuria.  Musculoskeletal: Negative for back pain.  Skin: Negative for wound.  Neurological: Positive for light-headedness. Negative for dizziness, weakness, numbness and headaches.  Psychiatric/Behavioral: Negative for  confusion.  All other systems reviewed and are negative.     Allergies  Ciprofloxacin; Sulfonamide derivatives; Bee venom; and Pravastatin  Home Medications   Prior to Admission medications   Medication Sig Start Date End Date Taking? Authorizing Provider  albuterol (PROAIR HFA) 108 (90 BASE) MCG/ACT inhaler Inhale 2 puffs into the lungs every 6 (six) hours as needed for wheezing or shortness of breath.   Yes Historical Provider, MD  aspirin EC 81 MG tablet Take 81 mg by mouth daily.   Yes Historical Provider, MD  benazepril (LOTENSIN) 20 MG tablet Take 20 mg by mouth daily. 07/16/14  Yes Historical Provider, MD  canagliflozin (INVOKANA) 100 MG TABS tablet Take 100 mg by mouth daily.    Yes Historical Provider, MD  cetirizine (ZYRTEC) 10 MG tablet Take 10 mg by mouth daily.   Yes Historical Provider, MD  ergocalciferol (VITAMIN D2) 50000 UNITS capsule Take 50,000 Units by mouth once a week.   Yes Historical Provider, MD  esomeprazole (NEXIUM) 40 MG capsule Take 40 mg by mouth 2 (two) times daily before a meal.    Yes Historical Provider, MD  ezetimibe (ZETIA) 10 MG tablet Take 1 tablet (10 mg total) by mouth daily. 07/23/14  Yes Herminio Commons, MD  fesoterodine (TOVIAZ) 4 MG TB24 tablet Take 4 mg by mouth daily.   Yes Historical Provider, MD  hydrochlorothiazide (HYDRODIURIL) 25 MG tablet Take 25 mg by mouth daily.   Yes Historical Provider, MD  HYDROcodone-acetaminophen (NORCO) 7.5-325 MG per tablet Take 1 tablet by mouth every 6 (six) hours as needed for moderate pain or severe pain.    Yes Historical Provider, MD  hydrOXYzine (VISTARIL) 25 MG capsule Take 25 mg by mouth 3 (three) times daily as needed. FOR ITCHING 08/29/14  Yes Historical Provider, MD  levothyroxine (SYNTHROID, LEVOTHROID) 75 MCG tablet Take 75 mcg by mouth daily.     Yes Historical Provider, MD  LORazepam (ATIVAN) 1 MG tablet Take 1 mg by mouth 3 (three) times daily. 06/26/14  Yes Historical Provider, MD  metoCLOPramide  (REGLAN) 10 MG tablet TAKE 1 TABLET (10 MG TOTAL) BY MOUTH 3 (THREE) TIMES DAILY BEFORE MEALS. 09/10/14  Yes [name], NP  metoprolol (LOPRESSOR) 50 MG tablet Take 50 mg by mouth 2 (two) times daily.   Yes Historical Provider, MD  nitroGLYCERIN (NITROSTAT) 0.4 MG SL tablet Place 0.4 mg under the tongue every 5 (five) minutes as needed for chest pain.   Yes Historical Provider, MD  polyethylene glycol powder (GLYCOLAX/MIRALAX) powder Take 17 g by mouth daily. 07/26/14  Yes Historical Provider, MD  potassium chloride (K-DUR,KLOR-CON) 10 MEQ tablet Take 10 mEq by mouth 2 (two) times daily.    Yes Historical Provider, MD  pregabalin (LYRICA) 100 MG capsule Take 100 mg by mouth 3 (three) times daily.   Yes Historical Provider, MD  sertraline (ZOLOFT) 100 MG tablet Take 100 mg by mouth daily.  07/15/14  Yes Historical Provider, MD  tiotropium (SPIRIVA HANDIHALER) 18 MCG inhalation capsule Place 1 capsule (18 mcg total) into inhaler and inhale daily. 07/09/14  Yes Herminio Commons, MD  triamcinolone lotion (KENALOG) 0.1 % Apply 1  application topically 2 (two) times daily. 08/29/14  Yes Historical Provider, MD   BP 90/63 mmHg  Pulse 60  Temp(Src) 97.8 F (36.6 C) (Oral)  Resp 18  Ht 5\' 9"  (1.753 m)  Wt 280 lb (127.007 kg)  BMI 41.33 kg/m2  SpO2 97%  Physical Exam  Constitutional: She is oriented to person, place, and time. She appears well-developed and well-nourished.  Obese  HENT:  Head: Normocephalic and atraumatic.  Eyes: EOM are normal. Pupils are equal, round, and reactive to light.  Neck: Neck supple.  Cardiovascular: Normal rate, regular rhythm and normal heart sounds.   No murmur heard. Pulmonary/Chest: Effort normal and breath sounds normal. No respiratory distress. She has no wheezes.  Abdominal: Soft. Bowel sounds are normal. There is no tenderness. There is no rebound.  Neurological: She is alert and oriented to person, place, and time.  No dysmetria noted to  finger-nose-finger, 5 out of 5 strength in all 4 extremities  Skin: Skin is warm and dry.  Psychiatric: She has a normal mood and affect.  Nursing note and vitals reviewed.   ED Course  Procedures (including critical care time)\  DIAGNOSTIC STUDIES: Oxygen Saturation is 97% on RA, normal by my interpretation.    COORDINATION OF CARE: 2:55 PM- Pt advised of plan for treatment and pt agrees.  Labs Review Labs Reviewed  CBC WITH DIFFERENTIAL/PLATELET - Abnormal; Notable for the following:    Basophils Relative 2 (*)    All other components within normal limits  BASIC METABOLIC PANEL - Abnormal; Notable for the following:    Glucose, Bld 115 (*)    BUN 35 (*)    Creatinine, Ser 1.46 (*)    GFR calc non Af Amer 40 (*)    GFR calc Af Amer 46 (*)    All other components within normal limits  URINALYSIS, ROUTINE W REFLEX MICROSCOPIC - Abnormal; Notable for the following:    Specific Gravity, Urine >1.030 (*)    Glucose, UA 500 (*)    Leukocytes, UA SMALL (*)    All other components within normal limits  URINE MICROSCOPIC-ADD ON - Abnormal; Notable for the following:    Squamous Epithelial / LPF FEW (*)    Bacteria, UA FEW (*)    All other components within normal limits  CBG MONITORING, ED - Abnormal; Notable for the following:    Glucose-Capillary 114 (*)    All other components within normal limits  TROPONIN I    Imaging Review Dg Chest 2 View  09/18/2014   CLINICAL DATA:  weakness, hypotension, dizziness, and diarrhea x 2 days, Hx diabetes, HTN, asthma, COPD, former smoker  EXAM: CHEST - 2 VIEW  COMPARISON:  CT 06/05/2014 and earlier studies  FINDINGS: Lungs are clear. Heart size and mediastinal contours are within normal limits. No effusion. Visualized skeletal structures are unremarkable.  IMPRESSION: No acute cardiopulmonary disease.   Electronically Signed   By: Lucrezia Europe M.D.   On: 09/18/2014 15:44     EKG Interpretation   Date/Time:  Wednesday September 18 2014  15:02:41 EDT Ventricular Rate:  62 PR Interval:  191 QRS Duration: 94 QT Interval:  404 QTC Calculation: 410 R Axis:   63 Text Interpretation:  Sinus rhythm Abnormal R-wave progression, early  transition Minimal ST elevation, inferior leads Seen on prior EKGs  Confirmed by Meadow Abramo  MD, Loma Sousa ([id number]) on 09/18/2014 3:12:25 PM      MDM   Final diagnoses:  Other specified hypotension  Dehydration  Patient presents with presyncopal feelings. Reports recent outside hospital admission for low blood pressure but no changes in her medications. She is taken her blood pressure medications today including benazepril and metoprolol. Initial blood pressure noted to be 90/63. Patient is afebrile. No evidence of leukocytosis or other evidence of infection, sepsis. Patient very symptomatic when sitting on the side of the bed and orthostatics were unable to be obtained. Patient was given 2 L of fluid. EKG is unchanged from prior and troponin is negative. Lab work notable for creatinine of 1.46 patient's baseline 0.8-0.9.  Patient reports improvement of presyncopal feelings with hydration. However, she and he needs to be hypotensive with blood pressures of 90s over 50s. She now also is reporting a "bellyache" she is due to have an esophageal dilation tomorrow with Dr. Laural Golden.  Given patient symptomatically hypotension and scheduled procedure for tomorrow, will admit for hydration and holding blood pressure medications.  I personally performed the services described in this documentation, which was scribed in my presence. The recorded information has been reviewed and is accurate.    Merryl Hacker, MD 09/18/14 [phone]

## 2014-09-18 NOTE — ED Notes (Signed)
For the last 4 days has episodes of feeling weak and near synope and falling forward, was admitted  A few months ago with same symptoms, dx with orthostatic

## 2014-09-18 NOTE — ED Notes (Signed)
Pt hypotensive while lying down and c/o cp/not feeling well. Will attempt orthostatic vs when patient is more stable/feeling better.

## 2014-09-19 ENCOUNTER — Inpatient Hospital Stay (HOSPITAL_COMMUNITY): Admission: RE | Admit: 2014-09-19 | Payer: Medicaid Other | Source: Ambulatory Visit

## 2014-09-19 DIAGNOSIS — R079 Chest pain, unspecified: Secondary | ICD-10-CM | POA: Diagnosis not present

## 2014-09-19 DIAGNOSIS — I952 Hypotension due to drugs: Secondary | ICD-10-CM | POA: Diagnosis not present

## 2014-09-19 DIAGNOSIS — R55 Syncope and collapse: Secondary | ICD-10-CM | POA: Diagnosis not present

## 2014-09-19 DIAGNOSIS — R0789 Other chest pain: Secondary | ICD-10-CM | POA: Diagnosis not present

## 2014-09-19 DIAGNOSIS — I951 Orthostatic hypotension: Secondary | ICD-10-CM | POA: Diagnosis not present

## 2014-09-19 LAB — GLUCOSE, CAPILLARY
Glucose-Capillary: 93 mg/dL (ref 70–99)
Glucose-Capillary: 187 mg/dL — ABNORMAL HIGH (ref 70–99)
Glucose-Capillary: 141 mg/dL — ABNORMAL HIGH (ref 70–99)
Glucose-Capillary: 98 mg/dL (ref 70–99)

## 2014-09-19 LAB — TROPONIN I: Troponin I: <0.03 ng/mL (ref <0.031)

## 2014-09-19 LAB — BASIC METABOLIC PANEL
Anion gap: 7 (ref 5–15)
BUN: 24 mg/dL — ABNORMAL HIGH (ref 6–23)
CHLORIDE: 110 mmol/L (ref 96–112)
CO2: 23 mmol/L (ref 19–32)
Calcium: 8.8 mg/dL (ref 8.4–10.5)
Creatinine, Ser: 1.09 mg/dL (ref 0.50–1.10)
GFR calc Af Amer: 66 mL/min — ABNORMAL LOW (ref >90)
GFR calc non Af Amer: 57 mL/min — ABNORMAL LOW (ref >90)
GLUCOSE: 99 mg/dL (ref 70–99)
POTASSIUM: 3.8 mmol/L (ref 3.5–5.1)
SODIUM: 140 mmol/L (ref 135–145)

## 2014-09-19 LAB — CBC
HCT: 35.5 % — ABNORMAL LOW (ref 36.0–46.0)
Hemoglobin: 11.6 g/dL — ABNORMAL LOW (ref 12.0–15.0)
MCH: 28.3 pg (ref 26.0–34.0)
MCHC: 32.7 g/dL (ref 30.0–36.0)
MCV: 86.6 fL (ref 78.0–100.0)
PLATELETS: 148 10*3/uL — AB (ref 150–400)
RBC: 4.1 MIL/uL (ref 3.87–5.11)
RDW: 13.8 % (ref 11.5–15.5)
WBC: 5.1 10*3/uL (ref 4.0–10.5)

## 2014-09-19 LAB — CORTISOL: CORTISOL PLASMA: 8.2 ug/dL

## 2014-09-19 MED ORDER — ENSURE ENLIVE PO LIQD
237.0000 mL | Freq: Three times a day (TID) | ORAL | Status: DC
  Administered 2014-09-19 – 2014-09-20 (×3): 237 mL via ORAL

## 2014-09-19 NOTE — Progress Notes (Signed)
Nutrition Brief Note  Patient identified on the Malnutrition Screening Tool (MST) Report  Wt Readings from Last 15 Encounters:  09/18/14 282 lb 9.6 oz (128.187 kg)  09/11/14 282 lb 9.6 oz (128.187 kg)  08/14/14 280 lb (127.007 kg)  08/01/14 285 lb 6.4 oz (129.457 kg)  07/09/14 289 lb (131.09 kg)  05/30/14 288 lb 6.4 oz (130.817 kg)  03/19/14 298 lb 1 oz (135.2 kg)  01/21/14 293 lb (132.904 kg)  10/02/13 270 lb (122.471 kg)  09/20/13 279 lb (126.554 kg)  04/05/13 265 lb (120.203 kg)  03/13/13 266 lb 14.4 oz (121.065 kg)  05/23/12 272 lb 11.2 oz (123.696 kg)  03/22/12 271 lb (122.925 kg)  02/24/12 271 lb 1.6 oz (122.97 kg)    Body mass index is 41.71 kg/(m^2). Patient meets criteria for Morbidly Obese based on current BMI.   Current diet order is soft, patient is consuming approximately 100% of meals at this time. Labs and medications reviewed.   Pt states she has lost 10 pounds recently  due to a diet that her endocrinologist (Dr. Dorris Fetch) put her on. It sounds like the "diet " s simply the plate method with only milk/water to drink, elimination of high carbohydrate choices, and no snacks.  Pt states she recently has only been doing 1 meal a day. Told her to inform Dr. Dorris Fetch of this change in eating behavior.  She declined snacks/supplements as she said she is trying to lose weight and doesn't want to eat more than she has to.   No nutrition interventions warranted at this time. If nutrition issues arise, please consult RD.   Burtis Junes RD, LDN Nutrition Pager: [phone] 09/19/2014 1:11 PM

## 2014-09-19 NOTE — Progress Notes (Signed)
UR completed 

## 2014-09-19 NOTE — Care Management Note (Addendum)
    Page 1 of 1   09/20/2014     8:45:17 AM CARE MANAGEMENT NOTE 09/20/2014  Patient:  Amber Fischer, Amber Fischer   Account Number:  [id number]  Date Initiated:  09/19/2014  Documentation initiated by:  Theophilus Kinds  Subjective/Objective Assessment:   Pt admitted from home with CP. Pt lives alone and will return home at discharge. Pt is fairly independent with ADL's. Pt has a cane and walker for home use. Pt is followed by [facility].     Action/Plan:   No Cm needs noted. Anticipate discharge within 24 hours.   Anticipated DC Date:  09/20/2014   Anticipated DC Plan:  [place]  CM consult      Choice offered to / List presented to:             Status of service:  Completed, signed off Medicare Important Message given?   (If response is "NO", the following Medicare IM given date fields will be blank) Date Medicare IM given:   Medicare IM given by:   Date Additional Medicare IM given:   Additional Medicare IM given by:    Discharge Disposition:  HOME/SELF CARE  Per UR Regulation:    If discussed at Long Length of Stay Meetings, dates discussed:    Comments:  09/20/14 0840 Christinia Gully, RN BSN CM Pt discharged home today. No CM needs noted.  09/19/14 [place], RN BSN CM

## 2014-09-19 NOTE — Progress Notes (Signed)
[facility] PROGRESS NOTE  Amber Fischer DVV:[id number] DOB: August 23, 1960 DOA: 09/18/2014 PCP: Neale Burly, MD  Assessment/Plan: 1-Hypotension, dizziness: secondary to hypovolemia, recent diarrheal illness, poor oral intake, and BP medications.  Improved with IV fluids.  Continue to hold BP medications.   2-near syncope; secondary to hypotension. See problem one.   3-Chest pain; resolved. Enzymes negative, check ECHO.  4-Dysphagia; needs to reschedule appointment for endoscopy dilation when BP stable.   Code Status: Full  Family Communication: Care discussed with patient.  Disposition Plan: remain in hospital for IV fluids.    Consultants:  none  Procedures:  ECHO.   Antibiotics:  none  HPI/Subjective: Feeling better, no chest pain. Dizziness improved.   Objective: Filed Vitals:   09/19/14 0544  BP: 91/49  Pulse: 65  Temp: 98 F (36.7 C)  Resp: 16    Intake/Output Summary (Last 24 hours) at 09/19/14 0816 Last data filed at 09/19/14 0634  Gross per 24 hour  Intake 678.75 ml  Output      0 ml  Net 678.75 ml   Filed Weights   09/18/14 1441 09/18/14 2107  Weight: 127.007 kg (280 lb) 128.187 kg (282 lb 9.6 oz)    Exam:   General:  No acute distress.   Cardiovascular: S 1, S 2 RRR  Respiratory: CTA  Abdomen: BS present, soft, nt  Musculoskeletal: no edema.    Data Reviewed: Basic Metabolic Panel:  Recent Labs Lab 09/18/14 1515 09/19/14 0658  NA 135 140  K 4.2 3.8  CL 104 110  CO2 22 23  GLUCOSE 115* 99  BUN 35* 24*  CREATININE 1.46* 1.09  CALCIUM 9.4 8.8   Liver Function Tests: No results for input(s): AST, ALT, ALKPHOS, BILITOT, PROT, ALBUMIN in the last 168 hours. No results for input(s): LIPASE, AMYLASE in the last 168 hours. No results for input(s): AMMONIA in the last 168 hours. CBC:  Recent Labs Lab 09/18/14 1515 09/19/14 0658  WBC 7.8 5.1  NEUTROABS 3.8  --   HGB 13.2 11.6*  HCT 40.1 35.5*  MCV 87.2 86.6  PLT  208 148*   Cardiac Enzymes:  Recent Labs Lab 09/18/14 1515 09/18/14 2039 09/19/14 0133 09/19/14 0658  TROPONINI <0.03 <0.03 <0.03 <0.03   BNP (last 3 results) No results for input(s): BNP in the last 8760 hours.  ProBNP (last 3 results) No results for input(s): PROBNP in the last 8760 hours.  CBG:  Recent Labs Lab 09/18/14 1502 09/18/14 2115 09/19/14 0719  GLUCAP 114* 110* 93    No results found for this or any previous visit (from the past 240 hour(s)).   Studies: Dg Chest 2 View  09/18/2014   CLINICAL DATA:  weakness, hypotension, dizziness, and diarrhea x 2 days, Hx diabetes, HTN, asthma, COPD, former smoker  EXAM: CHEST - 2 VIEW  COMPARISON:  CT 06/05/2014 and earlier studies  FINDINGS: Lungs are clear. Heart size and mediastinal contours are within normal limits. No effusion. Visualized skeletal structures are unremarkable.  IMPRESSION: No acute cardiopulmonary disease.   Electronically Signed   By: Lucrezia Europe M.D.   On: 09/18/2014 15:44    Scheduled Meds: . aspirin EC  81 mg Oral Daily  . canagliflozin  100 mg Oral QAC breakfast  . [START ON 09/23/2014] ergocalciferol  50,000 Units Oral Weekly  . ezetimibe  10 mg Oral Daily  . feeding supplement (ENSURE ENLIVE)  237 mL Oral BID BM  . fesoterodine  4 mg Oral Daily  . heparin  5,000 Units Subcutaneous 3 times per day  . Influenza vac split quadrivalent PF  0.5 mL Intramuscular Tomorrow-1000  . levothyroxine  75 mcg Oral QAC breakfast  . loratadine  10 mg Oral Daily  . metoCLOPramide  10 mg Oral TID AC  . pantoprazole  40 mg Oral Daily  . pregabalin  100 mg Oral TID  . sertraline  100 mg Oral Daily  . sodium chloride  3 mL Intravenous Q12H  . tiotropium  18 mcg Inhalation Daily   Continuous Infusions: . sodium chloride 75 mL/hr at 09/18/14 2131    Active Problems:   Essential hypertension, benign   Gastroparesis   GERD (gastroesophageal reflux disease)   Coronary atherosclerosis of native coronary  artery   Dysphagia, pharyngoesophageal phase   Chest pain, atypical   Pre-syncope   Hypotension    Time spent: 35 minutes.    Niel Hummer A  [facility] Pager [phone]. If 7PM-7AM, please contact night-coverage at [email or web address], password [facility] 09/19/2014, 8:16 AM

## 2014-09-19 NOTE — Progress Notes (Signed)
*  PRELIMINARY RESULTS* Echocardiogram 2D Echocardiogram has been performed.  [name] 09/19/2014, 2:06 PM

## 2014-09-19 NOTE — Progress Notes (Signed)
PT Cancellation Note  Patient Details Name: Amber Fischer MRN: [id number] DOB: Nov 05, 1960   Cancelled Treatment:    Reason Eval/Treat Not Completed: Patient at procedure or test/unavailable.  Several attempts were made to see pt for evaluation but she was unavailable due to multiple tests.  Will see tomorrow.   Demetrios Isaacs L 09/19/2014, 1:48 PM

## 2014-09-20 DIAGNOSIS — I952 Hypotension due to drugs: Secondary | ICD-10-CM

## 2014-09-20 DIAGNOSIS — R0789 Other chest pain: Secondary | ICD-10-CM | POA: Diagnosis not present

## 2014-09-20 DIAGNOSIS — R55 Syncope and collapse: Secondary | ICD-10-CM | POA: Diagnosis not present

## 2014-09-20 MED ORDER — METOPROLOL TARTRATE 50 MG PO TABS
50.0000 mg | ORAL_TABLET | Freq: Two times a day (BID) | ORAL | Status: DC
  Administered 2014-09-20: 50 mg via ORAL
  Filled 2014-09-20: qty 1

## 2014-09-20 MED ORDER — ENSURE ENLIVE PO LIQD: 237.0000 mL | Freq: Three times a day (TID) | ORAL | Status: DC

## 2014-09-20 NOTE — Progress Notes (Signed)
Patient discharged with instructions, prescription, and care notes.  Verbalized understanding via teach back.  IV was removed and the site was WNL. Patient voiced no further complaints or concerns at the time of discharge.  Appointments scheduled per instructions.  Patient left the floor via w/c with staff and family in stable condition.  Prior to discharge Dr. Tyrell Antonio PT recommendations.

## 2014-09-20 NOTE — Discharge Summary (Signed)
Physician Discharge Summary  Amber Fischer EHM:[id number] DOB: [birth year] DOA: 09/18/2014  PCP: Neale Burly, MD  Admit date: 09/18/2014 Discharge date: 09/20/2014  Time spent: 35 minutes  Recommendations for Outpatient Follow-up:  Follow up with GI, for endoscopy.  Reassessment of BP , and adjust medications as needed.   Discharge Diagnoses:    Hypotension   Near syncope.    Chest pain, atypical   Essential hypertension, benign   Gastroparesis   GERD (gastroesophageal reflux disease)   Coronary atherosclerosis of native coronary artery   Dysphagia, pharyngoesophageal phase   Discharge Condition: stable.   Diet recommendation: heart healthy  Filed Weights   09/18/14 1441 09/18/14 2107  Weight: 127.007 kg (280 lb) 128.187 kg (282 lb 9.6 oz)    History of present illness:  Amber Fischer is a 54 y.o. female with hx of HTN, known CAD, s/p cardiac stent placement over a year ago, no longer on Plavix, Hx of HTN who has been on ARB and betablocker along with diuretic, hx of orthostatic hypotension, has not been on chronic steroids, hx of COPD, DM2, COPD and asthma, OCD, presented to the ER as she has been feeling lightheaded, along with having dysphagia and chest discomfort when she swallow. She was scheduled to have EGD and possible esosphageal dilatation tomorrow. She denied fever, chills, coughs, GU symptoms, aches, HA, Vx problems, or neurological symptoms. Evaluation in the ER included a BP 86, HR 56, BS of 115, Cr 1.4, and normal Hb. EKG showed SNR at 62 with no acute ST T changes, and CXR showed no infiltrates. Her UA was negative as well. She was given IVF, and she felt better. When I saw her, she was sitting up eating her dinner, and converse meaningfully. Hospitalist was asked to admit her for further observation and Tx.   Hospital Course:  1-Hypotension, dizziness: secondary to hypovolemia, recent diarrheal illness, poor oral intake, and BP medications.  Improved with  IV fluids. resolved.  Resume metoprolol. Hold benazepril at discharge/   2-near syncope; secondary to hypotension. See problem one. Resolved.   3-Chest pain; resolved. Enzymes negative,  ECHO: normal EF, no wall motion abnormalities.  4-Dysphagia; needs to reschedule appointment for endoscopy dilation when BP stable. Started on ensure.   Procedures:  ECHO; normal Ef, mild diastolic dysfunction.   Consultations: none  Discharge Exam: Filed Vitals:   09/20/14 0655  BP: 126/68  Pulse: 66  Temp: 97.9 F (36.6 C)  Resp: 16    General: Alert in no distress. Feeling well, no dizziness.  Cardiovascular: S 1, S 2 RRR Respiratory: CTA  Discharge Instructions   Discharge Instructions    Diet - low sodium heart healthy    Complete by:  As directed      Increase activity slowly    Complete by:  As directed           Current Discharge Medication List    START taking these medications   Details  feeding supplement, ENSURE ENLIVE, (ENSURE ENLIVE) LIQD Take 237 mLs by mouth 3 (three) times daily between meals. Qty: 237 mL, Refills: 12      CONTINUE these medications which have NOT CHANGED   Details  albuterol (PROAIR HFA) 108 (90 BASE) MCG/ACT inhaler Inhale 2 puffs into the lungs every 6 (six) hours as needed for wheezing or shortness of breath.    aspirin EC 81 MG tablet Take 81 mg by mouth daily.    canagliflozin (INVOKANA) 100 MG TABS tablet Take 100 mg  by mouth daily.     cetirizine (ZYRTEC) 10 MG tablet Take 10 mg by mouth daily.    ergocalciferol (VITAMIN D2) 50000 UNITS capsule Take 50,000 Units by mouth once a week.    esomeprazole (NEXIUM) 40 MG capsule Take 40 mg by mouth 2 (two) times daily before a meal.     ezetimibe (ZETIA) 10 MG tablet Take 1 tablet (10 mg total) by mouth daily. Qty: 30 tablet, Refills: 6    fesoterodine (TOVIAZ) 4 MG TB24 tablet Take 4 mg by mouth daily.    HYDROcodone-acetaminophen (NORCO) 7.5-325 MG per tablet Take 1 tablet by  mouth every 6 (six) hours as needed for moderate pain or severe pain.     hydrOXYzine (VISTARIL) 25 MG capsule Take 25 mg by mouth 3 (three) times daily as needed. FOR ITCHING Refills: 0    levothyroxine (SYNTHROID, LEVOTHROID) 75 MCG tablet Take 75 mcg by mouth daily.      LORazepam (ATIVAN) 1 MG tablet Take 1 mg by mouth 3 (three) times daily. Refills: 2    metoCLOPramide (REGLAN) 10 MG tablet TAKE 1 TABLET (10 MG TOTAL) BY MOUTH 3 (THREE) TIMES DAILY BEFORE MEALS. Qty: 90 tablet, Refills: 0    metoprolol (LOPRESSOR) 50 MG tablet Take 50 mg by mouth 2 (two) times daily.    nitroGLYCERIN (NITROSTAT) 0.4 MG SL tablet Place 0.4 mg under the tongue every 5 (five) minutes as needed for chest pain.    polyethylene glycol powder (GLYCOLAX/MIRALAX) powder Take 17 g by mouth daily. Refills: 1    pregabalin (LYRICA) 100 MG capsule Take 100 mg by mouth 3 (three) times daily.    sertraline (ZOLOFT) 100 MG tablet Take 100 mg by mouth daily.  Refills: 2    tiotropium (SPIRIVA HANDIHALER) 18 MCG inhalation capsule Place 1 capsule (18 mcg total) into inhaler and inhale daily. Qty: 30 capsule, Refills: 6    triamcinolone lotion (KENALOG) 0.1 % Apply 1 application topically 2 (two) times daily. Refills: 1      STOP taking these medications     benazepril (LOTENSIN) 20 MG tablet      hydrochlorothiazide (HYDRODIURIL) 25 MG tablet      potassium chloride (K-DUR,KLOR-CON) 10 MEQ tablet        Allergies  Allergen Reactions  . Ciprofloxacin Itching and Nausea And Vomiting  . Sulfonamide Derivatives Hives  . Bee Venom Swelling and Rash  . Pravastatin Rash    "swelling in legs/arms, bumps all over"    Follow-up Information    Follow up with HASANAJ,XAJE A, MD In 1 week.   Specialty:  Internal Medicine   Contact information:   [facility] [place] [id number] [phone]        The results of significant diagnostics from this hospitalization (including imaging,  microbiology, ancillary and laboratory) are listed below for reference.    Significant Diagnostic Studies: Dg Chest 2 View  09/18/2014   CLINICAL DATA:  weakness, hypotension, dizziness, and diarrhea x 2 days, Hx diabetes, HTN, asthma, COPD, former smoker  EXAM: CHEST - 2 VIEW  COMPARISON:  CT 06/05/2014 and earlier studies  FINDINGS: Lungs are clear. Heart size and mediastinal contours are within normal limits. No effusion. Visualized skeletal structures are unremarkable.  IMPRESSION: No acute cardiopulmonary disease.   Electronically Signed   By: Lucrezia Europe M.D.   On: 09/18/2014 15:44    Microbiology: No results found for this or any previous visit (from the past 240 hour(s)).   Labs:  Basic Metabolic Panel:  Recent Labs Lab 09/18/14 1515 09/19/14 0658  NA 135 140  K 4.2 3.8  CL 104 110  CO2 22 23  GLUCOSE 115* 99  BUN 35* 24*  CREATININE 1.46* 1.09  CALCIUM 9.4 8.8   Liver Function Tests: No results for input(s): AST, ALT, ALKPHOS, BILITOT, PROT, ALBUMIN in the last 168 hours. No results for input(s): LIPASE, AMYLASE in the last 168 hours. No results for input(s): AMMONIA in the last 168 hours. CBC:  Recent Labs Lab 09/18/14 1515 09/19/14 0658  WBC 7.8 5.1  NEUTROABS 3.8  --   HGB 13.2 11.6*  HCT 40.1 35.5*  MCV 87.2 86.6  PLT 208 148*   Cardiac Enzymes:  Recent Labs Lab 09/18/14 1515 09/18/14 2039 09/19/14 0133 09/19/14 0658  TROPONINI <0.03 <0.03 <0.03 <0.03   BNP: BNP (last 3 results) No results for input(s): BNP in the last 8760 hours.  ProBNP (last 3 results) No results for input(s): PROBNP in the last 8760 hours.  CBG:  Recent Labs Lab 09/18/14 2115 09/19/14 0719 09/19/14 1133 09/19/14 1605 09/19/14 2205  GLUCAP 110* 93 187* 141* 98       Signed:  Regalado, Belkys A  [facility] 09/20/2014, 8:07 AM

## 2014-09-20 NOTE — Evaluation (Signed)
Physical Therapy Evaluation Patient Details Name: Amber Fischer MRN: [id number] DOB: [birth year] Today's Date: 09/20/2014   History of Present Illness  PI: Amber Fischer is a 54 y.o. female with hx of HTN, known CAD, s/p cardiac stent placement over a year ago, no longer on Plavix, Hx of HTN who has been on ARB and betablocker along with diuretic, hx of orthostatic hypotension, has not been on chronic steroids, hx of COPD, DM2, COPD and asthma, OCD, presented to the ER as she has been feeling lightheaded, along with having dysphagia and chest discomfort when she swallow. She was scheduled to have EGD and possible esosphageal dilatation tomorrow. She denied fever, chills, coughs, GU symptoms, aches, HA, Vx problems, or neurological symptoms. Evaluation in the ER included a BP 86, HR 56, BS of 115, Cr 1.4, and normal Hb. EKG showed SNR at 62 with no acute ST T changes, and CXR showed no infiltrates. Her UA was negative as well. She was given IVF, and she felt better. When I saw her, she was sitting up eating her dinner, and converse meaningfully. Hospitalist was asked to admit her for further observation and Tx.   Clinical Impression  Pt was seen for evaluation and found to be very close to her functional baseline.  She is morbidly obese with chronic back pain and is generally very sedentary.  Because of this she has generalized weakness but she is able to ambulate functional distances with a quad cane.  I have recommended that she use her walker for protection of her back and she is planning to begin a swimming program at a nearby facility.  I have encouraged this.    Follow Up Recommendations No PT follow up    Equipment Recommendations  None recommended by PT    Recommendations for Other Services   none    Precautions / Restrictions Precautions Precautions: None Restrictions Weight Bearing Restrictions: No      Mobility  Bed Mobility Overal bed mobility: Modified Independent                 Transfers Overall transfer level: Modified independent Equipment used: Quad cane                Ambulation/Gait Ambulation/Gait assistance: Modified independent (Device/Increase time) Ambulation Distance (Feet): 100 Feet Assistive device: Quad cane Gait Pattern/deviations: WFL(Within Functional Limits) Gait velocity: appropriate for situation Gait velocity interpretation: Below normal speed for age/gender    Stairs            Wheelchair Mobility    Modified Rankin (Stroke Patients Only)       Balance Overall balance assessment: No apparent balance deficits (not formally assessed)                                           Pertinent Vitals/Pain Pain Assessment: No/denies pain    Home Living Family/patient expects to be discharged to:: Private residence Living Arrangements: Alone Available Help at Discharge: Family;Available PRN/intermittently Type of Home: Apartment Home Access: Stairs to enter Entrance Stairs-Rails: Right;Left;Can reach both Entrance Stairs-Number of Steps: 10 Home Layout: One level Home Equipment: Walker - 2 wheels;Cane - quad      Prior Function Level of Independence: Independent with assistive device(s)         Comments: ususally uses a quad cane for gait but sometimes uses a walker  Hand Dominance   Dominant Hand: Right    Extremity/Trunk Assessment   Upper Extremity Assessment: Overall WFL for tasks assessed           Lower Extremity Assessment: Generalized weakness (chronic lower back dysfunction with minimal activity at home)         Communication   Communication: No difficulties  Cognition Arousal/Alertness: Awake/alert Behavior During Therapy: WFL for tasks assessed/performed Overall Cognitive Status: Within Functional Limits for tasks assessed                      General Comments      Exercises        Assessment/Plan    PT Assessment Patent does  not need any further PT services  PT Diagnosis Generalized weakness   PT Problem List    PT Treatment Interventions     PT Goals (Current goals can be found in the Care Plan section) Acute Rehab PT Goals PT Goal Formulation: All assessment and education complete, DC therapy    Frequency     Barriers to discharge  one flight of steps down to her apartment-she should be able to negotiate this but I have encouraged her to seek a 1st floor living arrangement      Co-evaluation               End of Session Equipment Utilized During Treatment: Gait belt Activity Tolerance: Patient tolerated treatment well Patient left: in bed;with call bell/phone within reach;with nursing/sitter in room Nurse Communication: Mobility status         Time: 1443-1540 PT Time Calculation (min) (ACUTE ONLY): 33 min   Charges:   PT Evaluation $Initial PT Evaluation Tier I: 1 Procedure     PT G CodesDemetrios Isaacs L 09/20/2014, 10:45 AM

## 2014-09-24 ENCOUNTER — Other Ambulatory Visit (INDEPENDENT_AMBULATORY_CARE_PROVIDER_SITE_OTHER): Payer: Self-pay | Admitting: Internal Medicine

## 2014-09-26 ENCOUNTER — Telehealth (INDEPENDENT_AMBULATORY_CARE_PROVIDER_SITE_OTHER): Payer: Self-pay | Admitting: *Deleted

## 2014-09-26 NOTE — Telephone Encounter (Signed)
Patient's Insurance was [employer]. I spoke with the representative ,Maggie. She states that this medication did not process because it needs to be Nexium 40 mg not Esomeprazole. A prescription for Nexium 40 mg - take 1 by mouth twice a day #60 with 5 refills was called to the [facility]. If any problems , pharmacy will call us back.

## 2014-09-27 ENCOUNTER — Telehealth (INDEPENDENT_AMBULATORY_CARE_PROVIDER_SITE_OTHER): Payer: Self-pay | Admitting: *Deleted

## 2014-09-27 NOTE — Telephone Encounter (Signed)
Amber Fischer called and said she misplaced her Reglan medicine. Would like to get samples or another medicine if possible. [name] we do not have samples and she would have to wait until the Rx is due at the pharmacy.

## 2014-09-27 NOTE — Telephone Encounter (Signed)
Amber Fischer sent in a request on 09/26/14. Patient states to [name] that she lost her March prescription,Pharmacy will not allow her to get it until it is time. Will be discussed with Dr.Rehman/Amber Fischer on 09/30/14.

## 2014-09-30 NOTE — Telephone Encounter (Signed)
Forwarded to [place] for review.

## 2014-10-23 ENCOUNTER — Other Ambulatory Visit (INDEPENDENT_AMBULATORY_CARE_PROVIDER_SITE_OTHER): Payer: Self-pay | Admitting: Internal Medicine

## 2014-11-05 ENCOUNTER — Encounter: Payer: Self-pay | Admitting: Cardiovascular Disease

## 2014-11-05 ENCOUNTER — Ambulatory Visit (INDEPENDENT_AMBULATORY_CARE_PROVIDER_SITE_OTHER): Payer: Medicaid Other | Admitting: Cardiovascular Disease

## 2014-11-05 VITALS — BP 118/94 | HR 74 | Ht 69.0 in | Wt 278.0 lb

## 2014-11-05 DIAGNOSIS — E039 Hypothyroidism, unspecified: Secondary | ICD-10-CM

## 2014-11-05 DIAGNOSIS — I2510 Atherosclerotic heart disease of native coronary artery without angina pectoris: Secondary | ICD-10-CM

## 2014-11-05 DIAGNOSIS — I10 Essential (primary) hypertension: Secondary | ICD-10-CM | POA: Diagnosis not present

## 2014-11-05 DIAGNOSIS — R079 Chest pain, unspecified: Secondary | ICD-10-CM | POA: Diagnosis not present

## 2014-11-05 DIAGNOSIS — R945 Abnormal results of liver function studies: Secondary | ICD-10-CM

## 2014-11-05 DIAGNOSIS — Z955 Presence of coronary angioplasty implant and graft: Secondary | ICD-10-CM

## 2014-11-05 DIAGNOSIS — Z87898 Personal history of other specified conditions: Secondary | ICD-10-CM

## 2014-11-05 DIAGNOSIS — Z9289 Personal history of other medical treatment: Secondary | ICD-10-CM

## 2014-11-05 DIAGNOSIS — J438 Other emphysema: Secondary | ICD-10-CM

## 2014-11-05 DIAGNOSIS — R7989 Other specified abnormal findings of blood chemistry: Secondary | ICD-10-CM

## 2014-11-05 NOTE — Patient Instructions (Signed)
Continue all current medications. Your physician wants you to follow up in: 6 months.  You will receive a reminder letter in the mail one-two months in advance.  If you don't receive a letter, please call our office to schedule the follow up appointment   

## 2014-11-05 NOTE — Progress Notes (Signed)
Patient ID: Amber Fischer, female   DOB: 10-29-1960, 53 y.o.   MRN: [id number]      SUBJECTIVE: The patient presents for routine follow up for CAD. She underwent a nuclear stress test in September 2015 when she was hospitalized for chest pain which did not demonstrate any evidence of myocardial ischemia or scar. Her most recent cardiac catheterization was performed on 10/26/2012, which showed a patent RCA stent, 30% stenosis in the distal LAD, and 40% stenosis in the distal left circumflex coronary artery. Her RCA stent was placed on 09/18/2012.  She also has a history of hyperlipidemia, COPD, depression, fibromyalgia, GERD, hypertension, hypothyroidism, diabetes mellitus, obesity, and sleep apnea. She is a former smoker and quit in October 2012.  She was hospitalized for hypotension due to nausea, vomiting, and diarrhea with a near syncopal episode in late March/early April at [facility]. She was given IV fluids and felt better. She had chest pain with normal troponins. Chest x-ray was normal. An echocardiogram on 3/31 demonstrated normal left ventricular systolic function, EF 12-19%, grade 1 diastolic dysfunction, and mild right ventricular enlargement.  She is feeling better now. Has some back and leg pain, no chest pain.     Review of Systems: As per "subjective", otherwise negative.  Allergies  Allergen Reactions  . Ciprofloxacin Itching and Nausea And Vomiting  . Sulfonamide Derivatives Hives  . Bee Venom Swelling and Rash  . Pravastatin Rash    "swelling in legs/arms, bumps all over"     Current Outpatient Prescriptions  Medication Sig Dispense Refill  . albuterol (PROAIR HFA) 108 (90 BASE) MCG/ACT inhaler Inhale 2 puffs into the lungs every 6 (six) hours as needed for wheezing or shortness of breath.    Marland Kitchen aspirin EC 81 MG tablet Take 81 mg by mouth daily.    . canagliflozin (INVOKANA) 100 MG TABS tablet Take 100 mg by mouth daily.     . cetirizine (ZYRTEC) 10 MG tablet Take 10  mg by mouth daily.    . ergocalciferol (VITAMIN D2) 50000 UNITS capsule Take 50,000 Units by mouth once a week.    . ezetimibe (ZETIA) 10 MG tablet Take 1 tablet (10 mg total) by mouth daily. 30 tablet 6  . feeding supplement, ENSURE ENLIVE, (ENSURE ENLIVE) LIQD Take 237 mLs by mouth 3 (three) times daily between meals. 237 mL 12  . fesoterodine (TOVIAZ) 4 MG TB24 tablet Take 4 mg by mouth daily.    Marland Kitchen HYDROcodone-acetaminophen (NORCO) 7.5-325 MG per tablet Take 1 tablet by mouth every 6 (six) hours as needed for moderate pain or severe pain.     . hydrOXYzine (VISTARIL) 25 MG capsule Take 25 mg by mouth 3 (three) times daily as needed. FOR ITCHING  0  . levothyroxine (SYNTHROID, LEVOTHROID) 75 MCG tablet Take 75 mcg by mouth daily.      Marland Kitchen LORazepam (ATIVAN) 1 MG tablet Take 1 mg by mouth 3 (three) times daily.  2  . metoCLOPramide (REGLAN) 10 MG tablet TAKE 1 TABLET (10 MG TOTAL) BY MOUTH 3 (THREE) TIMES DAILY BEFORE MEALS. 90 tablet 0  . metoprolol (LOPRESSOR) 50 MG tablet Take 50 mg by mouth 2 (two) times daily.    Marland Kitchen NEXIUM 40 MG capsule TAKE ONE CAPSULE BY MOUTH TWICE A DAY 60 capsule 0  . nitroGLYCERIN (NITROSTAT) 0.4 MG SL tablet Place 0.4 mg under the tongue every 5 (five) minutes as needed for chest pain.    . polyethylene glycol powder (GLYCOLAX/MIRALAX) powder Take 17  g by mouth daily.  1  . pregabalin (LYRICA) 100 MG capsule Take 100 mg by mouth 3 (three) times daily.    . sertraline (ZOLOFT) 100 MG tablet Take 100 mg by mouth daily.   2  . tiotropium (SPIRIVA HANDIHALER) 18 MCG inhalation capsule Place 1 capsule (18 mcg total) into inhaler and inhale daily. 30 capsule 6  . triamcinolone lotion (KENALOG) 0.1 % Apply 1 application topically 2 (two) times daily.  1   No current facility-administered medications for this visit.    Past Medical History  Diagnosis Date  . Rectal bleed 11/02/2010  . Epigastric abdominal pain 08/19/2010  . Sciatica   . Back pain, chronic   . Type 2  diabetes mellitus   . Essential hypertension, benign   . Asthma   . COPD (chronic obstructive pulmonary disease)   . Coronary atherosclerosis of native coronary artery     Prior stent RCA - patent May 2014 with otherwise nonobstructive disease  . OCD (obsessive compulsive disorder)   . Fibromyalgia     Past Surgical History  Procedure Laterality Date  . Colonoscopy w/ biopsies  12/24/2010    NUR  . Colonoscopy  11/30/2010  . Abdominal hysterectomy    . Cholecystectomy    . Esophagogastroduodenoscopy  03/22/2012    Procedure: ESOPHAGOGASTRODUODENOSCOPY (EGD);  Surgeon: Rogene Houston, MD;  Location: [facility];  Service: Endoscopy;  Laterality: N/A;  300  . Colonoscopy, esophagogastroduodenoscopy (egd) and esophageal dilation N/A 04/05/2013    Procedure: COLONOSCOPY, ESOPHAGOGASTRODUODENOSCOPY (EGD) AND ESOPHAGEAL DILATION;  Surgeon: Rogene Houston, MD;  Location: [facility];  Service: Endoscopy;  Laterality: N/A;  . Esophagogastroduodenoscopy N/A 08/14/2014    Procedure: ESOPHAGOGASTRODUODENOSCOPY (EGD);  Surgeon: Rogene Houston, MD;  Location: [facility];  Service: Endoscopy;  Laterality: N/A;  225  . Esophageal dilation N/A 08/14/2014    Procedure: ESOPHAGEAL DILATION;  Surgeon: Rogene Houston, MD;  Location: [facility];  Service: Endoscopy;  Laterality: N/A;    History   Social History  . Marital Status: Divorced    Spouse Name: N/A  . Number of Children: N/A  . Years of Education: N/A   Occupational History  . Not on file.   Social History Main Topics  . Smoking status: Former Smoker -- 0.50 packs/day for 20 years    Types: Cigarettes    Start date: 08/16/1977    Quit date: 09/21/2011  . Smokeless tobacco: Never Used     Comment: Quit 60yr ago after smoking 12 yrs  . Alcohol Use: No     Comment: rare  . Drug Use: No  . Sexual Activity: Not on file   Other Topics Concern  . Not on file   Social History Narrative     Filed Vitals:   11/05/14  1520  BP: 118/94  Pulse: 74  Height: 5\' 9"  (1.753 m)  Weight: 278 lb (126.1 kg)  SpO2: 97%    PHYSICAL EXAM General: NAD, obese Neck: No JVD, no thyromegaly or thyroid nodule.  Lungs: Clear to auscultation bilaterally with normal respiratory effort. CV: Nondisplaced PMI. Heart regular S1/S2, no S3/S4, no murmur. No peripheral edema.   Abdomen: Soft, obese, no distention.  Skin: Intact without lesions or rashes.  Neurologic: Alert and oriented x 3.  Psych: Normal affect. Extremities: No clubbing or cyanosis.  HEENT: Normal.  Skin: Normal. Musculoskeletal: Normal range of motion, no gross deformities.  ECG: Most recent ECG reviewed.      ASSESSMENT  AND PLAN: 1. CAD with RCA stent: Stable ischemic heart disease with normal nuclear stress test in 9/15 and normal LV systolic function and regional wall motion by echo on 09/19/14. I will continue aspirin and metoprolol. She is not able to tolerate statin therapy reportedly due to liver dysfunction.  2. Essential HTN: Mild DBP elevation on current therapy which includes metoprolol. No longer on benazepril due to hypotension. No changes.  3. Hyperlipidemia: She is not able to tolerate statin therapy reportedly due to liver dysfunction. Currently on Zetia. Will check to see when most recent lipids were performed by PCP.  4. Insulin-dependent diabetes mellitus: I previously made a referral to endocrinology for diabetes management.  5. Hypothyroidism: I previously made a referral to endocrinology.  6. COPD: Continue Spiriva 18 mcg inhaled once daily.   Dispo: f/u 6 months.    Kate Sable, M.D., F.A.C.C.

## 2014-11-27 ENCOUNTER — Other Ambulatory Visit (INDEPENDENT_AMBULATORY_CARE_PROVIDER_SITE_OTHER): Payer: Self-pay | Admitting: Internal Medicine

## 2014-12-30 ENCOUNTER — Other Ambulatory Visit (INDEPENDENT_AMBULATORY_CARE_PROVIDER_SITE_OTHER): Payer: Self-pay | Admitting: Internal Medicine

## 2015-01-27 ENCOUNTER — Other Ambulatory Visit (INDEPENDENT_AMBULATORY_CARE_PROVIDER_SITE_OTHER): Payer: Self-pay | Admitting: Internal Medicine

## 2015-03-13 ENCOUNTER — Other Ambulatory Visit: Payer: Self-pay | Admitting: *Deleted

## 2015-03-13 MED ORDER — EZETIMIBE 10 MG PO TABS: 10.0000 mg | ORAL_TABLET | Freq: Every day | ORAL | Status: DC

## 2015-05-22 ENCOUNTER — Ambulatory Visit (INDEPENDENT_AMBULATORY_CARE_PROVIDER_SITE_OTHER): Payer: Medicaid Other | Admitting: Cardiovascular Disease

## 2015-05-22 VITALS — BP 113/78 | HR 71 | Ht 69.0 in | Wt 279.0 lb

## 2015-05-22 DIAGNOSIS — Z955 Presence of coronary angioplasty implant and graft: Secondary | ICD-10-CM

## 2015-05-22 DIAGNOSIS — I2510 Atherosclerotic heart disease of native coronary artery without angina pectoris: Secondary | ICD-10-CM

## 2015-05-22 DIAGNOSIS — I10 Essential (primary) hypertension: Secondary | ICD-10-CM

## 2015-05-22 DIAGNOSIS — E785 Hyperlipidemia, unspecified: Secondary | ICD-10-CM

## 2015-05-22 MED ORDER — NITROGLYCERIN 0.4 MG SL SUBL: 0.4000 mg | SUBLINGUAL_TABLET | SUBLINGUAL | Status: DC | PRN

## 2015-05-22 NOTE — Progress Notes (Signed)
Patient ID: Amber Fischer, female   DOB: 11/22/1960, 54 y.o.   MRN: [id number]      SUBJECTIVE: The patient presents for routine follow up for CAD. She underwent a nuclear stress test in September 2015 when she was hospitalized for chest pain which did not demonstrate any evidence of myocardial ischemia or scar. Her most recent cardiac catheterization was performed on 10/26/2012, which showed a patent RCA stent, 30% stenosis in the distal LAD, and 40% stenosis in the distal left circumflex coronary artery. Her RCA stent was placed on 09/18/2012.  She also has a history of hyperlipidemia, COPD, depression, fibromyalgia, GERD, hypertension, hypothyroidism, diabetes mellitus, obesity, and sleep apnea. She is a former smoker and quit in October 2012.  Echocardiogram on 09/19/14 demonstrated normal left ventricular systolic function, EF 123456, grade 1 diastolic dysfunction, and mild right ventricular enlargement.  Denies chest pain, shortness of breath, and leg swelling. No nitroglycerin use since last visit with me in 10/2014.   Review of Systems: As per "subjective", otherwise negative.  Allergies  Allergen Reactions  . Ciprofloxacin Itching and Nausea And Vomiting  . Sulfonamide Derivatives Hives  . Bee Venom Swelling and Rash  . Pravastatin Rash    "swelling in legs/arms, bumps all over"     Current Outpatient Prescriptions  Medication Sig Dispense Refill  . albuterol (PROAIR HFA) 108 (90 BASE) MCG/ACT inhaler Inhale 2 puffs into the lungs every 6 (six) hours as needed for wheezing or shortness of breath.    Marland Kitchen aspirin EC 81 MG tablet Take 81 mg by mouth daily.    . benazepril (LOTENSIN) 20 MG tablet Take 20 mg by mouth daily.    . Biotin 300 MCG TABS Take 1 tablet by mouth daily.  0  . canagliflozin (INVOKANA) 100 MG TABS tablet Take 100 mg by mouth daily.     . cetirizine (ZYRTEC) 10 MG tablet Take 10 mg by mouth daily.    . DULoxetine (CYMBALTA) 30 MG capsule Take 30 mg by mouth  daily.  0  . ergocalciferol (VITAMIN D2) 50000 UNITS capsule Take 50,000 Units by mouth once a week.    . ezetimibe (ZETIA) 10 MG tablet Take 1 tablet (10 mg total) by mouth daily. 30 tablet 6  . fesoterodine (TOVIAZ) 4 MG TB24 tablet Take 4 mg by mouth daily.    Marland Kitchen HYDROcodone-acetaminophen (NORCO) 7.5-325 MG per tablet Take 1 tablet by mouth every 6 (six) hours as needed for moderate pain or severe pain.     Marland Kitchen levothyroxine (SYNTHROID, LEVOTHROID) 88 MCG tablet Take 88 mcg by mouth daily before breakfast.    . LORazepam (ATIVAN) 1 MG tablet Take 1 mg by mouth 3 (three) times daily.  2  . metFORMIN (GLUCOPHAGE) 1000 MG tablet Take 1 tablet by mouth 2 (two) times daily.  0  . metoCLOPramide (REGLAN) 10 MG tablet TAKE 1 TABLET (10 MG TOTAL) BY MOUTH 3 (THREE) TIMES DAILY BEFORE MEALS. 90 tablet 0  . metoprolol (LOPRESSOR) 50 MG tablet Take 50 mg by mouth 2 (two) times daily.    Marland Kitchen NEXIUM 40 MG capsule TAKE ONE CAPSULE BY MOUTH TWICE A DAY 60 capsule 0  . nitroGLYCERIN (NITROSTAT) 0.4 MG SL tablet Place 0.4 mg under the tongue every 5 (five) minutes as needed for chest pain.    . polyethylene glycol powder (GLYCOLAX/MIRALAX) powder Take 17 g by mouth daily.  1  . potassium chloride (K-DUR) 10 MEQ tablet Take 10 mEq by mouth 2 (two) times daily.    Marland Kitchen  pregabalin (LYRICA) 100 MG capsule Take 100 mg by mouth 3 (three) times daily.    . sertraline (ZOLOFT) 100 MG tablet Take 100 mg by mouth daily.   2  . tiotropium (SPIRIVA HANDIHALER) 18 MCG inhalation capsule Place 1 capsule (18 mcg total) into inhaler and inhale daily. 30 capsule 6   No current facility-administered medications for this visit.    Past Medical History  Diagnosis Date  . Rectal bleed 11/02/2010  . Epigastric abdominal pain 08/19/2010  . Sciatica   . Back pain, chronic   . Type 2 diabetes mellitus   . Essential hypertension, benign   . Asthma   . COPD (chronic obstructive pulmonary disease)   . Coronary atherosclerosis of  native coronary artery     Prior stent RCA - patent May 2014 with otherwise nonobstructive disease  . OCD (obsessive compulsive disorder)   . Fibromyalgia     Past Surgical History  Procedure Laterality Date  . Colonoscopy w/ biopsies  12/24/2010    NUR  . Colonoscopy  11/30/2010  . Abdominal hysterectomy    . Cholecystectomy    . Esophagogastroduodenoscopy  03/22/2012    Procedure: ESOPHAGOGASTRODUODENOSCOPY (EGD);  Surgeon: Rogene Houston, MD;  [facility]: [facility];  Service: Endoscopy;  Laterality: N/A;  300  . Colonoscopy, esophagogastroduodenoscopy (egd) and esophageal dilation N/A 04/05/2013    Procedure: COLONOSCOPY, ESOPHAGOGASTRODUODENOSCOPY (EGD) AND ESOPHAGEAL DILATION;  Surgeon: Rogene Houston, MD;  Location: [facility];  Service: Endoscopy;  Laterality: N/A;  . Esophagogastroduodenoscopy N/A 08/14/2014    Procedure: ESOPHAGOGASTRODUODENOSCOPY (EGD);  Surgeon: Rogene Houston, MD;  [facility]: [facility];  Service: Endoscopy;  Laterality: N/A;  225  . Esophageal dilation N/A 08/14/2014    Procedure: ESOPHAGEAL DILATION;  Surgeon: Rogene Houston, MD;  [facility]: [facility];  Service: Endoscopy;  Laterality: N/A;    Social History   Social History  . Marital Status: Divorced    Spouse Name: N/A  . Number of Children: N/A  . Years of Education: N/A   Occupational History  . Not on file.   Social History Main Topics  . Smoking status: Former Smoker -- 0.50 packs/day for 20 years    Types: Cigarettes    Start date: 08/16/1977    Quit date: 09/21/2011  . Smokeless tobacco: Never Used     Comment: Quit 39yr ago after smoking 12 yrs  . Alcohol Use: No     Comment: rare  . Drug Use: No  . Sexual Activity: Not on file   Other Topics Concern  . Not on file   Social History Narrative     Filed Vitals:   05/22/15 1447  BP: 113/78  Pulse: 71  Height: 5\' 9"  (1.753 m)  Weight: 279 lb (126.554 kg)    PHYSICAL EXAM General: NAD, obese Neck: No JVD, no  thyromegaly or thyroid nodule.  Lungs: Clear to auscultation bilaterally with normal respiratory effort. CV: Nondisplaced PMI. Heart regular S1/S2, no S3/S4, no murmur. No peripheral edema.  Abdomen: Soft, obese, no distention.  Skin: Intact without lesions or rashes.  Neurologic: Alert and oriented x 3.  Psych: Normal affect. Extremities: No clubbing or cyanosis.  HEENT: Normal.  Skin: Normal. Musculoskeletal: No gross deformities.  ECG: Most recent ECG reviewed.      ASSESSMENT AND PLAN: 1. CAD with RCA stent: Stable ischemic heart disease with normal nuclear stress test in 9/15 and normal LV systolic function and regional wall motion by echo on 09/19/14. I  will continue aspirin and metoprolol. She is not able to tolerate statin therapy reportedly due to liver dysfunction.  2. Essential HTN: Controlled. No changes.  3. Hyperlipidemia: She is not able to tolerate statin therapy reportedly due to liver dysfunction. Currently on Zetia. Will check to see when most recent lipids were performed by PCP.  Dispo: f/u 1 year.   Kate Sable, M.D., F.A.C.C.

## 2015-05-22 NOTE — Patient Instructions (Signed)

## 2015-06-12 ENCOUNTER — Other Ambulatory Visit: Payer: Self-pay | Admitting: "Endocrinology

## 2015-07-03 ENCOUNTER — Other Ambulatory Visit: Payer: Self-pay | Admitting: "Endocrinology

## 2015-07-07 ENCOUNTER — Telehealth: Payer: Self-pay | Admitting: "Endocrinology

## 2015-07-07 NOTE — Telephone Encounter (Signed)
Called pt back.

## 2015-07-16 ENCOUNTER — Other Ambulatory Visit: Payer: Self-pay | Admitting: "Endocrinology

## 2015-07-28 ENCOUNTER — Ambulatory Visit: Payer: Self-pay | Admitting: "Endocrinology

## 2015-08-01 LAB — HM DIABETES EYE EXAM

## 2015-08-11 ENCOUNTER — Other Ambulatory Visit (INDEPENDENT_AMBULATORY_CARE_PROVIDER_SITE_OTHER): Payer: Self-pay | Admitting: Internal Medicine

## 2015-08-11 ENCOUNTER — Other Ambulatory Visit: Payer: Self-pay | Admitting: "Endocrinology

## 2015-08-12 ENCOUNTER — Other Ambulatory Visit: Payer: Self-pay | Admitting: "Endocrinology

## 2015-08-27 ENCOUNTER — Encounter: Payer: Self-pay | Admitting: "Endocrinology

## 2015-09-09 ENCOUNTER — Other Ambulatory Visit: Payer: Self-pay | Admitting: "Endocrinology

## 2015-09-11 ENCOUNTER — Other Ambulatory Visit: Payer: Self-pay | Admitting: "Endocrinology

## 2015-10-09 ENCOUNTER — Other Ambulatory Visit: Payer: Self-pay | Admitting: Cardiovascular Disease

## 2015-11-10 ENCOUNTER — Other Ambulatory Visit: Payer: Self-pay | Admitting: "Endocrinology

## 2015-11-10 DIAGNOSIS — E1165 Type 2 diabetes mellitus with hyperglycemia: Secondary | ICD-10-CM

## 2015-11-10 DIAGNOSIS — IMO0002 Reserved for concepts with insufficient information to code with codable children: Secondary | ICD-10-CM

## 2015-11-10 DIAGNOSIS — E039 Hypothyroidism, unspecified: Secondary | ICD-10-CM

## 2015-11-10 DIAGNOSIS — E1169 Type 2 diabetes mellitus with other specified complication: Principal | ICD-10-CM

## 2015-11-22 LAB — BASIC METABOLIC PANEL
BUN: 12 mg/dL (ref 7–25)
CALCIUM: 9.1 mg/dL (ref 8.6–10.4)
CO2: 18 mmol/L — ABNORMAL LOW (ref 20–31)
Chloride: 107 mmol/L (ref 98–110)
Creat: 0.77 mg/dL (ref 0.50–1.05)
Glucose, Bld: 164 mg/dL — ABNORMAL HIGH (ref 65–99)
POTASSIUM: 4 mmol/L (ref 3.5–5.3)
SODIUM: 138 mmol/L (ref 135–146)

## 2015-11-22 LAB — TSH: TSH: 2.19 m[IU]/L

## 2015-11-22 LAB — HEMOGLOBIN A1C
Hgb A1c MFr Bld: 6.5 % — ABNORMAL HIGH (ref <5.7)
Mean Plasma Glucose: 140 mg/dL

## 2015-11-22 LAB — T4, FREE: FREE T4: 1.1 ng/dL (ref 0.8–1.8)

## 2015-11-27 ENCOUNTER — Encounter: Payer: Self-pay | Admitting: "Endocrinology

## 2015-11-27 ENCOUNTER — Ambulatory Visit (INDEPENDENT_AMBULATORY_CARE_PROVIDER_SITE_OTHER): Payer: Medicaid Other | Admitting: "Endocrinology

## 2015-11-27 VITALS — BP 146/82 | HR 67 | Ht 69.0 in | Wt 266.0 lb

## 2015-11-27 DIAGNOSIS — E785 Hyperlipidemia, unspecified: Secondary | ICD-10-CM

## 2015-11-27 DIAGNOSIS — IMO0002 Reserved for concepts with insufficient information to code with codable children: Secondary | ICD-10-CM

## 2015-11-27 DIAGNOSIS — E038 Other specified hypothyroidism: Secondary | ICD-10-CM

## 2015-11-27 DIAGNOSIS — I10 Essential (primary) hypertension: Secondary | ICD-10-CM

## 2015-11-27 DIAGNOSIS — E118 Type 2 diabetes mellitus with unspecified complications: Secondary | ICD-10-CM

## 2015-11-27 DIAGNOSIS — E1165 Type 2 diabetes mellitus with hyperglycemia: Secondary | ICD-10-CM | POA: Diagnosis not present

## 2015-11-27 MED ORDER — LEVOTHYROXINE SODIUM 88 MCG PO TABS: 88.0000 ug | ORAL_TABLET | Freq: Every morning | ORAL | Status: DC

## 2015-11-27 MED ORDER — CANAGLIFLOZIN 100 MG PO TABS: 100.0000 mg | ORAL_TABLET | Freq: Every day | ORAL | Status: DC

## 2015-11-27 MED ORDER — METFORMIN HCL 1000 MG PO TABS: 1000.0000 mg | ORAL_TABLET | Freq: Two times a day (BID) | ORAL | Status: DC

## 2015-11-27 NOTE — Patient Instructions (Signed)

## 2015-11-27 NOTE — Progress Notes (Signed)
Subjective:    Patient ID: Amber Fischer, female    DOB: 1960-09-25. Patient is being seen in Follow-up for management of diabetes , hypothyroidism requested by  Neale Burly, MD  Past Medical History  Diagnosis Date  . Rectal bleed 11/02/2010  . Epigastric abdominal pain 08/19/2010  . Sciatica   . Back pain, chronic   . Type 2 diabetes mellitus ([place])   . Essential hypertension, benign   . Asthma   . COPD (chronic obstructive pulmonary disease) ([place])   . Coronary atherosclerosis of native coronary artery     Prior stent RCA - patent May 2014 with otherwise nonobstructive disease  . OCD (obsessive compulsive disorder)   . Fibromyalgia    Past Surgical History  Procedure Laterality Date  . Colonoscopy w/ biopsies  12/24/2010    NUR  . Colonoscopy  11/30/2010  . Abdominal hysterectomy    . Cholecystectomy    . Esophagogastroduodenoscopy  03/22/2012    Procedure: ESOPHAGOGASTRODUODENOSCOPY (EGD);  Surgeon: Rogene Houston, MD;  [facility];  Service: Endoscopy;  Laterality: N/A;  300  . Colonoscopy, esophagogastroduodenoscopy (egd) and esophageal dilation N/A 04/05/2013    Procedure: COLONOSCOPY, ESOPHAGOGASTRODUODENOSCOPY (EGD) AND ESOPHAGEAL DILATION;  Surgeon: Rogene Houston, MD;  [facility];  Service: Endoscopy;  Laterality: N/A;  . Esophagogastroduodenoscopy N/A 08/14/2014    Procedure: ESOPHAGOGASTRODUODENOSCOPY (EGD);  Surgeon: Rogene Houston, MD;  [facility];  Service: Endoscopy;  Laterality: N/A;  225  . Esophageal dilation N/A 08/14/2014    Procedure: ESOPHAGEAL DILATION;  Surgeon: Rogene Houston, MD;  [facility];  Service: Endoscopy;  Laterality: N/A;   Social History   Social History  . Marital Status: Divorced    Spouse Name: N/A  . Number of Children: N/A  . Years of Education: N/A   Social History Main Topics  . Smoking status: Former Smoker -- 0.50 packs/day for 20 years    Types: Cigarettes    Start  date: 08/16/1977    Quit date: 09/21/2011  . Smokeless tobacco: Never Used     Comment: Quit 33yr ago after smoking 12 yrs  . Alcohol Use: No     Comment: rare  . Drug Use: No  . Sexual Activity: Not Asked   Other Topics Concern  . None   Social History Narrative   Outpatient Encounter Prescriptions as of 11/27/2015  Medication Sig  . albuterol (PROAIR HFA) 108 (90 BASE) MCG/ACT inhaler Inhale 2 puffs into the lungs every 6 (six) hours as needed for wheezing or shortness of breath.  Marland Kitchen aspirin EC 81 MG tablet Take 81 mg by mouth daily.  . benazepril (LOTENSIN) 20 MG tablet Take 20 mg by mouth daily.  . Biotin 300 MCG TABS Take 1 tablet by mouth daily.  . cetirizine (ZYRTEC) 10 MG tablet Take 10 mg by mouth daily.  . DULoxetine (CYMBALTA) 30 MG capsule Take 30 mg by mouth daily.  . ergocalciferol (VITAMIN D2) 50000 UNITS capsule Take 50,000 Units by mouth once a week.  . fesoterodine (TOVIAZ) 4 MG TB24 tablet Take 4 mg by mouth daily.  Marland Kitchen HYDROcodone-acetaminophen (NORCO) 7.5-325 MG per tablet Take 1 tablet by mouth every 6 (six) hours as needed for moderate pain or severe pain.   Marland Kitchen levothyroxine (SYNTHROID, LEVOTHROID) 88 MCG tablet Take 1 tablet (88 mcg total) by mouth every morning.  Marland Kitchen LORazepam (ATIVAN) 1 MG tablet Take 1 mg by mouth 3 (three) times daily.  Marland Kitchen  metFORMIN (GLUCOPHAGE) 1000 MG tablet Take 1 tablet (1,000 mg total) by mouth 2 (two) times daily.  . metoCLOPramide (REGLAN) 10 MG tablet TAKE 1 TABLET (10 MG TOTAL) BY MOUTH 3 (THREE) TIMES DAILY BEFORE MEALS.  . metoprolol (LOPRESSOR) 50 MG tablet Take 50 mg by mouth 2 (two) times daily.  Marland Kitchen NEXIUM 40 MG capsule TAKE ONE CAPSULE BY MOUTH TWICE A DAY  . nitroGLYCERIN (NITROSTAT) 0.4 MG SL tablet Place 1 tablet (0.4 mg total) under the tongue every 5 (five) minutes as needed for chest pain.  . polyethylene glycol powder (GLYCOLAX/MIRALAX) powder Take 17 g by mouth daily.  . pregabalin (LYRICA) 100 MG capsule Take 100 mg by mouth 3  (three) times daily.  . sertraline (ZOLOFT) 100 MG tablet Take 100 mg by mouth daily.   Marland Kitchen tiotropium (SPIRIVA HANDIHALER) 18 MCG inhalation capsule Place 1 capsule (18 mcg total) into inhaler and inhale daily.  Marland Kitchen ZETIA 10 MG tablet TAKE 1 TABLET BY MOUTH EVERY DAY  . [DISCONTINUED] levothyroxine (SYNTHROID, LEVOTHROID) 88 MCG tablet TAKE 1 TABLET BY MOUTH EVERY MORNING  . [DISCONTINUED] metFORMIN (GLUCOPHAGE) 1000 MG tablet Take 1 tablet by mouth 2 (two) times daily.  . canagliflozin (INVOKANA) 100 MG TABS tablet Take 1 tablet (100 mg total) by mouth daily. Reported on 11/27/2015  . potassium chloride (K-DUR) 10 MEQ tablet Take 10 mEq by mouth 2 (two) times daily. Reported on 11/27/2015  . [DISCONTINUED] canagliflozin (INVOKANA) 100 MG TABS tablet Take 100 mg by mouth daily. Reported on 11/27/2015   No facility-administered encounter medications on file as of 11/27/2015.   ALLERGIES: Allergies  Allergen Reactions  . Ciprofloxacin Itching and Nausea And Vomiting  . Sulfonamide Derivatives Hives  . Bee Venom Swelling and Rash  . Pravastatin Rash    "swelling in legs/arms, bumps all over"    VACCINATION STATUS: Immunization History  Administered Date(s) Administered  . Influenza,inj,Quad PF,36+ Mos 03/20/2014, 09/19/2014    Diabetes She presents for her follow-up diabetic visit. She has type 2 diabetes mellitus. Onset time: She was diagnosed at approximate age of 29 years. Her disease course has been stable. There are no hypoglycemic associated symptoms. Pertinent negatives for hypoglycemia include no confusion, headaches, pallor or seizures. There are no diabetic associated symptoms. Pertinent negatives for diabetes include no chest pain, no polydipsia, no polyphagia and no polyuria. There are no hypoglycemic complications. Symptoms are stable. Risk factors for coronary artery disease include dyslipidemia, diabetes mellitus, hypertension, family history, obesity, sedentary lifestyle and tobacco  exposure. Current diabetic treatment includes oral agent (dual therapy). She is compliant with treatment most of the time. Her weight is decreasing steadily (She has lost 15 pounds since last visit in May 2016.). She is following a diabetic diet. When asked about meal planning, she reported none. She participates in exercise intermittently. Home blood sugar record trend: Her previsit A1c was 6.5%. An ACE inhibitor/angiotensin II receptor blocker is being taken. Eye exam is current.  Hyperlipidemia This is a chronic problem. The current episode started more than 1 year ago. Exacerbating diseases include diabetes, hypothyroidism and obesity. Pertinent negatives include no chest pain, myalgias or shortness of breath. Current antihyperlipidemic treatment includes statins. Risk factors for coronary artery disease include diabetes mellitus, dyslipidemia, hypertension, obesity and a sedentary lifestyle.  Hypertension This is a chronic problem. The current episode started more than 1 year ago. Pertinent negatives include no chest pain, headaches, palpitations or shortness of breath. Risk factors for coronary artery disease include dyslipidemia, diabetes mellitus, obesity  and sedentary lifestyle. Past treatments include ACE inhibitors and beta blockers. The current treatment provides moderate improvement.      Review of Systems  Constitutional: Negative for fever, chills and unexpected weight change.  HENT: Negative for trouble swallowing and voice change.   Eyes: Negative for visual disturbance.  Respiratory: Negative for cough, shortness of breath and wheezing.   Cardiovascular: Negative for chest pain, palpitations and leg swelling.  Gastrointestinal: Negative for nausea, vomiting and diarrhea.  Endocrine: Negative for cold intolerance, heat intolerance, polydipsia, polyphagia and polyuria.  Musculoskeletal: Negative for myalgias and arthralgias.  Skin: Negative for color change, pallor, rash and wound.   Neurological: Negative for seizures and headaches.  Psychiatric/Behavioral: Negative for suicidal ideas and confusion.    Objective:    BP 146/82 mmHg  Pulse 67  Ht 5\' 9"  (1.753 m)  Wt 266 lb (120.657 kg)  BMI 39.26 kg/m2  Wt Readings from Last 3 Encounters:  11/27/15 266 lb (120.657 kg)  05/22/15 279 lb (126.554 kg)  11/05/14 278 lb (126.1 kg)    Physical Exam  Constitutional: She is oriented to person, place, and time. She appears well-developed.  HENT:  Head: Normocephalic and atraumatic.  Eyes: EOM are normal.  Neck: Normal range of motion. Neck supple. No tracheal deviation present. No thyromegaly present.  Cardiovascular: Normal rate and regular rhythm.   Pulmonary/Chest: Effort normal and breath sounds normal.  Abdominal: Soft. Bowel sounds are normal. There is no tenderness. There is no guarding.  Musculoskeletal: Normal range of motion. She exhibits no edema.  Neurological: She is alert and oriented to person, place, and time. She has normal reflexes. No cranial nerve deficit. Coordination normal.  Skin: Skin is warm and dry. No rash noted. No erythema. No pallor.  Psychiatric: She has a normal mood and affect. Judgment normal.    CMP     Component Value Date/Time   NA 138 11/21/2015 1432   NA 138 06/30/2010   K 4.0 11/21/2015 1432   CL 107 11/21/2015 1432   CO2 18* 11/21/2015 1432   GLUCOSE 164* 11/21/2015 1432   BUN 12 11/21/2015 1432   CREATININE 0.77 11/21/2015 1432   CREATININE 1.09 09/19/2014 0658   CREATININE 0.7 06/30/2010   CALCIUM 9.1 11/21/2015 1432   PROT 7.3 05/30/2014 1622   ALBUMIN 4.3 05/30/2014 1622   AST 34 05/30/2014 1622   ALT 57* 05/30/2014 1622   ALKPHOS 69 05/30/2014 1622   BILITOT 0.9 05/30/2014 1622   GFRNONAA 57* 09/19/2014 0658   GFRAA 66* 09/19/2014 0658     Diabetic Labs (most recent): Lab Results  Component Value Date   HGBA1C 6.5* 11/21/2015   HGBA1C 6.4* 09/24/2013     Lipid Panel ( most recent) Lipid Panel      Component Value Date/Time   CHOL 223* 09/24/2013 0847   TRIG 137 09/24/2013 0847   HDL 50 09/24/2013 0847   CHOLHDL 4.5 09/24/2013 0847   VLDL 27 09/24/2013 0847   LDLCALC 146* 09/24/2013 0847      Results for JOCELYNN, PANO (MRN [id number]) as of 11/27/2015 11:22  Ref. Range 09/18/2014 15:30 11/21/2015 14:28 11/21/2015 14:30  TSH Latest Units: mIU/L 2.723  2.19  T4,Free(Direct) Latest Ref Range: 0.8-1.8 ng/dL  1.1     Assessment & Plan:   1. Uncontrolled type 2 diabetes mellitus with complication, without long-term current use of insulin ([place])   - Patient has currently uncontrolled symptomatic type 2 DM since  55 years of age,  with most  recent A1c of 6.5 %. Recent labs reviewed.  -  patient remains at a high risk for more acute and chronic complications of diabetes which include CAD, CVA, CKD, retinopathy, and neuropathy. These are all discussed in detail with the patient.  - I have counseled the patient on diet management and weight loss, by adopting a carbohydrate restricted/protein rich diet.  - Suggestion is made for patient to avoid simple carbohydrates   from their diet including Cakes , Desserts, Ice Cream,  Soda (  diet and regular) , Sweet Tea , Candies,  Chips, Cookies, Artificial Sweeteners,   and "Sugar-free" Products . This will help patient to have stable blood glucose profile and potentially avoid unintended weight gain.  - I encouraged the patient to switch to  unprocessed or minimally processed complex starch and increased protein intake (animal or plant source), fruits, and vegetables.  - Patient is advised to stick to a routine mealtimes to eat 3 meals  a day and avoid unnecessary snacks ( to snack only to correct hypoglycemia).  - The patient will be scheduled with Jearld Fenton, RDN, CDE for individualized DM education.  - I have approached patient with the following individualized plan to manage diabetes and patient agrees:   - Based on her progress associated  with 15 pounds weight loss since last visit and current A1c of 6.5%, she will not need insulin treatment for now. - I will continue metformin 1000 g by mouth twice a day and invokana 100 mg by mouth every morning , therapeutically suitable for patient. -Side effects and precautions are discussed with her. - Patient will be considered for incretin therapy if she loses control on subsequent visits. - Patient specific target  A1c;  LDL, HDL, Triglycerides, and  Waist Circumference were discussed in detail.  2) BP/HTN: Controllednue current medications including ACEI/ARB. 3) Lipids/HPL:  Controlled unknown, I will obtain fasting lipid panel, I advised her to continue statins. 4)  Weight/Diet: CDE Consult will be initiated , exercise, and detailed carbohydrates information provided.   5)  hypothyroidism: Her thyroid function tests are consistent with appropriate replacement. I advised her to continue levothyroxine 88 g by mouth every morning.  - We discussed about correct intake of levothyroxine, at fasting, with water, separated by at least 30 minutes from breakfast, and separated by more than 4 hours from calcium, iron, multivitamins, acid reflux medications (PPIs). -Patient is made aware of the fact that thyroid hormone replacement is needed for life, dose to be adjusted by periodic monitoring of thyroid function tests.  6) Chronic Care/Health Maintenance:  -Patient  on ACEI/ARB and Statin medications and encouraged to continue to follow up with Ophthalmology, Podiatrist at least yearly or according to recommendations, and advised to   stay away from smoking. I have recommended yearly flu vaccine and pneumonia vaccination at least every 5 years; moderate intensity exercise for up to 150 minutes weekly; and  sleep for at least 7 hours a day.  - 60 minutes of time was spent on the care of this patient , 50% of which was applied for counseling on diabetes complications and their preventions.  -  Patient to bring meter and  blood glucose logs during their next visit.   - I advised patient to maintain close follow up with Neale Burly, MD for primary care needs.  Follow up plan: - Return in about 6 months (around 05/28/2016) for diabetes, high blood pressure, high cholesterol, underactive thyroid, follow up with pre-visit labs.  Heriberto Antigua  Dorris Fetch, MD Phone: [phone]  Fax: [phone]   11/27/2015, 11:17 AM

## 2015-12-02 ENCOUNTER — Telehealth: Payer: Self-pay | Admitting: Cardiovascular Disease

## 2015-12-02 NOTE — Telephone Encounter (Signed)
Patient called stating that she will no longer be able to receive Zetia due to the cost.

## 2015-12-03 NOTE — Telephone Encounter (Signed)
Patient advised to have her pharmacy fax over rejection/or whatever the problem is regarding her insurance not covering the zetia. Patient verbalized understanding of plan.

## 2015-12-24 ENCOUNTER — Other Ambulatory Visit: Payer: Self-pay | Admitting: Cardiovascular Disease

## 2016-01-07 ENCOUNTER — Other Ambulatory Visit: Payer: Self-pay | Admitting: "Endocrinology

## 2016-01-07 ENCOUNTER — Telehealth: Payer: Self-pay | Admitting: "Endocrinology

## 2016-01-07 MED ORDER — FLUCONAZOLE 150 MG PO TABS: 150.0000 mg | ORAL_TABLET | Freq: Once | ORAL | Status: DC

## 2016-01-07 NOTE — Telephone Encounter (Signed)
Patient left message on voicemail requesting medicine for an ongoing yeast infection from her Invokana. She also wonders if her dosage needs to be lowered so this problem will go away.

## 2016-01-07 NOTE — Telephone Encounter (Signed)
Pt.notified

## 2016-01-07 NOTE — Telephone Encounter (Signed)
She is on the lowest dose of invokana, cannot lower it any further. I sent a prescription for Diflucan.

## 2016-02-28 ENCOUNTER — Other Ambulatory Visit (INDEPENDENT_AMBULATORY_CARE_PROVIDER_SITE_OTHER): Payer: Self-pay | Admitting: Internal Medicine

## 2016-03-26 ENCOUNTER — Other Ambulatory Visit: Payer: Self-pay | Admitting: "Endocrinology

## 2016-05-21 ENCOUNTER — Other Ambulatory Visit: Payer: Self-pay | Admitting: "Endocrinology

## 2016-05-25 ENCOUNTER — Other Ambulatory Visit: Payer: Self-pay | Admitting: "Endocrinology

## 2016-05-25 LAB — COMPLETE METABOLIC PANEL WITH GFR
ALT: 52 U/L — ABNORMAL HIGH (ref 6–29)
AST: 46 U/L — ABNORMAL HIGH (ref 10–35)
Albumin: 4.2 g/dL (ref 3.6–5.1)
Alkaline Phosphatase: 73 U/L (ref 33–130)
BILIRUBIN TOTAL: 1.1 mg/dL (ref 0.2–1.2)
BUN: 15 mg/dL (ref 7–25)
CHLORIDE: 104 mmol/L (ref 98–110)
CO2: 23 mmol/L (ref 20–31)
Calcium: 9.7 mg/dL (ref 8.6–10.4)
Creat: 1.02 mg/dL (ref 0.50–1.05)
GFR, EST AFRICAN AMERICAN: 72 mL/min (ref >=60)
GFR, EST NON AFRICAN AMERICAN: 63 mL/min (ref >=60)
Glucose, Bld: 106 mg/dL — ABNORMAL HIGH (ref 65–99)
POTASSIUM: 4.8 mmol/L (ref 3.5–5.3)
SODIUM: 139 mmol/L (ref 135–146)
TOTAL PROTEIN: 7.1 g/dL (ref 6.1–8.1)

## 2016-05-25 LAB — LIPID PANEL
CHOLESTEROL: 199 mg/dL (ref <200)
HDL: 49 mg/dL — AB (ref >50)
LDL Cholesterol: 120 mg/dL — ABNORMAL HIGH (ref <100)
Total CHOL/HDL Ratio: 4.1 Ratio (ref <5.0)
Triglycerides: 151 mg/dL — ABNORMAL HIGH (ref <150)
VLDL: 30 mg/dL (ref <30)

## 2016-05-25 LAB — T4, FREE: Free T4: 1.1 ng/dL (ref 0.8–1.8)

## 2016-05-25 LAB — TSH: TSH: 1.02 m[IU]/L

## 2016-05-26 LAB — MICROALBUMIN / CREATININE URINE RATIO
Creatinine, Urine: 290 mg/dL (ref 20–320)
MICROALB UR: 2.1 mg/dL
MICROALB/CREAT RATIO: 7 ug/mg{creat} (ref <30)

## 2016-05-26 LAB — HEMOGLOBIN A1C
HEMOGLOBIN A1C: 5.8 % — AB (ref <5.7)
Mean Plasma Glucose: 120 mg/dL

## 2016-05-27 ENCOUNTER — Ambulatory Visit (INDEPENDENT_AMBULATORY_CARE_PROVIDER_SITE_OTHER): Payer: Medicaid Other | Admitting: Orthopaedic Surgery

## 2016-05-27 ENCOUNTER — Encounter (INDEPENDENT_AMBULATORY_CARE_PROVIDER_SITE_OTHER): Payer: Self-pay | Admitting: Orthopaedic Surgery

## 2016-05-27 VITALS — BP 117/61 | HR 68 | Ht 69.5 in | Wt 243.0 lb

## 2016-05-27 DIAGNOSIS — M25511 Pain in right shoulder: Secondary | ICD-10-CM

## 2016-05-27 NOTE — Progress Notes (Signed)
Office Visit Note   Patient: Amber Fischer           Date of Birth: Oct 23, 1960           MRN: [id number] Visit Date: 05/27/2016              Requested by: Neale Burly, MD [place], [place] [id number] PCP: Neale Burly, MD   Assessment & Plan: Visit Diagnoses:  1. Right shoulder pain, unspecified chronicity   2.      Reported history of chronic pain on chronic narcotics with pain management  Plan: We will proceed with the MRI scan right shoulder which is more symptomatic to reduce shoulders.  Follow-Up Instructions: No Follow-up on file.   Orders:  Orders Placed This Encounter  Procedures  . MR SHOULDER RIGHT WO CONTRAST   No orders of the defined types were placed in this encounter.     Procedures: No procedures performed   Clinical Data: No additional findings.   Subjective: Chief Complaint  Patient presents with  . Right Shoulder - Pain  . Left Shoulder - Pain    Patient comes in today with complaint of bilateral upper extremity pain. She states she has fallen about eight times in the bathtub, the last of which was in September. She states that when she falls, she lands on her underarms on the side of the tub. She complains of bilateral shoulder pain, upper arm pain, forearm pain, and pain in both hands and wrists. She also states that she has some neck pain. She also states that she has had numbness and tingling in both hands x 1 week. She currently takes Hydrocodone 7.5/325 and Lyrica from Dr. Merlene Laughter with pain management. She denies having any x-rays made. She has tried exercises which do not help.  She states the reason for falling so much is due to her not having any balance. She has recently been put on Topamax as well.  Cymbalta, Ativan, Lyrica, Norco , and Topamax medications that may be affecting her balance. Patient also has diabetes with peripheral neuropathy  Review of Systems  Constitutional: Negative for chills and diaphoresis.    HENT: Negative for ear discharge, ear pain and nosebleeds.   Eyes: Negative for discharge and visual disturbance.  Respiratory: Negative for cough, choking and shortness of breath.   Cardiovascular: Negative for chest pain and palpitations.  Gastrointestinal: Negative for abdominal distention and abdominal pain.  Endocrine: Negative for cold intolerance and heat intolerance.       Diabetes reported the poorly controlled.  Genitourinary: Negative for flank pain and hematuria.  Musculoskeletal: Positive for back pain.  Skin: Negative for rash and wound.  Neurological: Negative for seizures and speech difficulty.  Hematological: Negative for adenopathy. Does not bruise/bleed easily.  Psychiatric/Behavioral: Negative for agitation and suicidal ideas.       Positive for anxiety     Objective: Vital Signs: BP 117/61   Pulse 68   Ht 5' 9.5" (1.765 m)   Wt 243 lb (110.2 kg)   BMI 35.37 kg/m   Physical Exam  Constitutional: She is oriented to person, place, and time. She appears well-developed.  HENT:  Head: Normocephalic.  Right Ear: External ear normal.  Left Ear: External ear normal.  Eyes: Pupils are equal, round, and reactive to light.  Neck: No tracheal deviation present. No thyromegaly present.  Cardiovascular: Normal rate.   Pulmonary/Chest: Effort normal.  Abdominal: Soft.  Musculoskeletal:  Negative straight leg raising  90. Negative compression anterior tib EHL is intact. Reflexes are 2+ and symmetrical upper and lower extremities. Pain with abduction of the shoulder pain with internal rotation. Right shoulder more symptomatic than left. Long head of biceps is located. Minimal brachioplexus tenderness.  Neurological: She is alert and oriented to person, place, and time.  Decreased light touch lower extremities the feet and ankles.  Skin: Skin is warm and dry.  Psychiatric:  Patient's memory of the history is the fair including diagnostic tests. Some of this may be due to  her medications. Mother is present here for additional history.    Ortho Exam  Specialty Comments:  No specialty comments available.  Imaging: No results found.   PMFS History: Patient Active Problem List   Diagnosis Date Noted  . Type 2 diabetes mellitus, uncontrolled ([place]) 11/27/2015  . Other specified hypothyroidism 11/27/2015  . Chest pain, atypical 09/18/2014  . Pre-syncope 09/18/2014  . Hypotension 09/18/2014  . Dysphagia, pharyngoesophageal phase 08/01/2014  . Precordial pain 03/19/2014  . Coronary atherosclerosis of native coronary artery 09/20/2013  . GERD (gastroesophageal reflux disease) 03/13/2013  . Gastroparesis 05/23/2012  . Hyperlipidemia 03/27/2009  . TOBACCO ABUSE 03/27/2009  . Essential hypertension, benign 03/27/2009   Past Medical History:  Diagnosis Date  . Asthma   . Back pain, chronic   . COPD (chronic obstructive pulmonary disease) ([place])   . Coronary atherosclerosis of native coronary artery    Prior stent RCA - patent May 2014 with otherwise nonobstructive disease  . Epigastric abdominal pain 08/19/2010  . Essential hypertension, benign   . Fibromyalgia   . OCD (obsessive compulsive disorder)   . Rectal bleed 11/02/2010  . Sciatica   . Type 2 diabetes mellitus ([facility])     Family History  Problem Relation Age of Onset  . Multiple sclerosis Sister     Past Surgical History:  Procedure Laterality Date  . ABDOMINAL HYSTERECTOMY    . CHOLECYSTECTOMY    . COLONOSCOPY  11/30/2010  . COLONOSCOPY W/ BIOPSIES  12/24/2010   NUR  . COLONOSCOPY, ESOPHAGOGASTRODUODENOSCOPY (EGD) AND ESOPHAGEAL DILATION N/A 04/05/2013   Procedure: COLONOSCOPY, ESOPHAGOGASTRODUODENOSCOPY (EGD) AND ESOPHAGEAL DILATION;  Surgeon: Rogene Houston, MD;  [facility];  Service: Endoscopy;  Laterality: N/A;  . ESOPHAGEAL DILATION N/A 08/14/2014   Procedure: ESOPHAGEAL DILATION;  Surgeon: Rogene Houston, MD;  [facility];  Service: Endoscopy;   Laterality: N/A;  . ESOPHAGOGASTRODUODENOSCOPY  03/22/2012   Procedure: ESOPHAGOGASTRODUODENOSCOPY (EGD);  Surgeon: Rogene Houston, MD;  [facility];  Service: Endoscopy;  Laterality: N/A;  300  . ESOPHAGOGASTRODUODENOSCOPY N/A 08/14/2014   Procedure: ESOPHAGOGASTRODUODENOSCOPY (EGD);  Surgeon: Rogene Houston, MD;  [facility];  Service: Endoscopy;  Laterality: N/A;  225   Social History   Occupational History  . Not on file.   Social History Main Topics  . Smoking status: Former Smoker    Packs/day: 0.50    Years: 20.00    Types: Cigarettes    Start date: 08/16/1977    Quit date: 09/21/2011  . Smokeless tobacco: Never Used     Comment: Quit 75yr ago after smoking 12 yrs  . Alcohol use No     Comment: rare  . Drug use: No  . Sexual activity: Not on file

## 2016-05-28 ENCOUNTER — Encounter: Payer: Self-pay | Admitting: "Endocrinology

## 2016-05-28 ENCOUNTER — Ambulatory Visit (INDEPENDENT_AMBULATORY_CARE_PROVIDER_SITE_OTHER): Payer: Medicaid Other | Admitting: "Endocrinology

## 2016-05-28 VITALS — BP 118/84 | HR 76 | Ht 69.0 in | Wt 262.0 lb

## 2016-05-28 DIAGNOSIS — E038 Other specified hypothyroidism: Secondary | ICD-10-CM

## 2016-05-28 DIAGNOSIS — I10 Essential (primary) hypertension: Secondary | ICD-10-CM

## 2016-05-28 DIAGNOSIS — E118 Type 2 diabetes mellitus with unspecified complications: Secondary | ICD-10-CM

## 2016-05-28 DIAGNOSIS — IMO0002 Reserved for concepts with insufficient information to code with codable children: Secondary | ICD-10-CM

## 2016-05-28 DIAGNOSIS — E1165 Type 2 diabetes mellitus with hyperglycemia: Secondary | ICD-10-CM

## 2016-05-28 DIAGNOSIS — E782 Mixed hyperlipidemia: Secondary | ICD-10-CM | POA: Diagnosis not present

## 2016-05-28 MED ORDER — LEVOTHYROXINE SODIUM 100 MCG PO TABS: 100.0000 ug | ORAL_TABLET | Freq: Every day | ORAL | 3 refills | Status: DC

## 2016-05-28 NOTE — Progress Notes (Signed)
Subjective:    Patient ID: Amber Fischer, female    DOB: [birth year]. Patient is being seen in Follow-up for management of diabetes , hypothyroidism requested by  Neale Burly, MD  Past Medical History:  Diagnosis Date  . Asthma   . Back pain, chronic   . COPD (chronic obstructive pulmonary disease) ([place])   . Coronary atherosclerosis of native coronary artery    Prior stent RCA - patent May 2014 with otherwise nonobstructive disease  . Epigastric abdominal pain 08/19/2010  . Essential hypertension, benign   . Fibromyalgia   . OCD (obsessive compulsive disorder)   . Rectal bleed 11/02/2010  . Sciatica   . Type 2 diabetes mellitus ([place])    Past Surgical History:  Procedure Laterality Date  . ABDOMINAL HYSTERECTOMY    . CHOLECYSTECTOMY    . COLONOSCOPY  11/30/2010  . COLONOSCOPY W/ BIOPSIES  12/24/2010   NUR  . COLONOSCOPY, ESOPHAGOGASTRODUODENOSCOPY (EGD) AND ESOPHAGEAL DILATION N/A 04/05/2013   Procedure: COLONOSCOPY, ESOPHAGOGASTRODUODENOSCOPY (EGD) AND ESOPHAGEAL DILATION;  Surgeon: Rogene Houston, MD;  [facility];  Service: Endoscopy;  Laterality: N/A;  . ESOPHAGEAL DILATION N/A 08/14/2014   Procedure: ESOPHAGEAL DILATION;  Surgeon: Rogene Houston, MD;  [facility];  Service: Endoscopy;  Laterality: N/A;  . ESOPHAGOGASTRODUODENOSCOPY  03/22/2012   Procedure: ESOPHAGOGASTRODUODENOSCOPY (EGD);  Surgeon: Rogene Houston, MD;  [facility];  Service: Endoscopy;  Laterality: N/A;  300  . ESOPHAGOGASTRODUODENOSCOPY N/A 08/14/2014   Procedure: ESOPHAGOGASTRODUODENOSCOPY (EGD);  Surgeon: Rogene Houston, MD;  [facility];  Service: Endoscopy;  Laterality: N/A;  225   Social History   Social History  . Marital status: Divorced    Spouse name: N/A  . Number of children: N/A  . Years of education: N/A   Social History Main Topics  . Smoking status: Former Smoker    Packs/day: 0.50    Years: 20.00    Types: Cigarettes    Start  date: 08/16/1977    Quit date: 09/21/2011  . Smokeless tobacco: Never Used     Comment: Quit 72yr ago after smoking 12 yrs  . Alcohol use No     Comment: rare  . Drug use: No  . Sexual activity: Not Asked   Other Topics Concern  . None   Social History Narrative  . None   Outpatient Encounter Prescriptions as of 05/28/2016  Medication Sig  . albuterol (PROAIR HFA) 108 (90 BASE) MCG/ACT inhaler Inhale 2 puffs into the lungs every 6 (six) hours as needed for wheezing or shortness of breath.  Marland Kitchen aspirin EC 81 MG tablet Take 81 mg by mouth daily.  . benazepril (LOTENSIN) 20 MG tablet Take 20 mg by mouth daily.  . cetirizine (ZYRTEC) 10 MG tablet Take 10 mg by mouth daily.  . DULoxetine (CYMBALTA) 30 MG capsule Take 30 mg by mouth daily.  Marland Kitchen ezetimibe (ZETIA) 10 MG tablet TAKE 1 TABLET BY MOUTH EVERY DAY.  . fesoterodine (TOVIAZ) 4 MG TB24 tablet Take 4 mg by mouth daily.  Marland Kitchen HYDROcodone-acetaminophen (NORCO) 7.5-325 MG per tablet Take 1 tablet by mouth every 6 (six) hours as needed for moderate pain or severe pain.   . INVOKANA 100 MG TABS tablet TAKE 1 TABLET BY MOUTH EVERY DAY  . levothyroxine (SYNTHROID, LEVOTHROID) 100 MCG tablet Take 1 tablet (100 mcg total) by mouth daily before breakfast.  . LORazepam (ATIVAN) 1 MG tablet Take 1 mg by mouth 3 (three)  times daily.  . metFORMIN (GLUCOPHAGE) 1000 MG tablet Take 1 tablet (1,000 mg total) by mouth 2 (two) times daily.  . metoCLOPramide (REGLAN) 10 MG tablet TAKE 1 TABLET (10 MG TOTAL) BY MOUTH 3 (THREE) TIMES DAILY BEFORE MEALS.  . metoprolol (LOPRESSOR) 50 MG tablet Take 50 mg by mouth 2 (two) times daily.  Marland Kitchen NEXIUM 40 MG capsule TAKE ONE CAPSULE BY MOUTH TWICE A DAY  . nitroGLYCERIN (NITROSTAT) 0.4 MG SL tablet Place 1 tablet (0.4 mg total) under the tongue every 5 (five) minutes as needed for chest pain.  . polyethylene glycol powder (GLYCOLAX/MIRALAX) powder Take 17 g by mouth daily.  . pregabalin (LYRICA) 100 MG capsule Take 100 mg by  mouth 3 (three) times daily.  . sertraline (ZOLOFT) 100 MG tablet Take 100 mg by mouth daily.   . SUMAtriptan (IMITREX) 100 MG tablet TAKE 1 TABLET WITH FLUIDS AS SOON AS POSSIBLE AFTER ONSET OF MIGRAINE ATTACK/MAY REPEAT AFTER 2 HOUR  . tiotropium (SPIRIVA HANDIHALER) 18 MCG inhalation capsule Place 1 capsule (18 mcg total) into inhaler and inhale daily.  Marland Kitchen topiramate (TOPAMAX) 100 MG tablet Take 100 mg by mouth 2 (two) times daily.  . [DISCONTINUED] Biotin 300 MCG TABS Take 1 tablet by mouth daily.  . [DISCONTINUED] ergocalciferol (VITAMIN D2) 50000 UNITS capsule Take 50,000 Units by mouth once a week.  . [DISCONTINUED] fluconazole (DIFLUCAN) 150 MG tablet Take 1 tablet (150 mg total) by mouth once.  . [DISCONTINUED] levothyroxine (SYNTHROID, LEVOTHROID) 88 MCG tablet TAKE 1 TABLET BY MOUTH EVERY MORNING  . [DISCONTINUED] LYRICA 150 MG capsule TAKE 1 CAPSULE BY MOUTH TWO TIMES DAILY  . [DISCONTINUED] potassium chloride (K-DUR) 10 MEQ tablet Take 10 mEq by mouth 2 (two) times daily. Reported on 11/27/2015   No facility-administered encounter medications on file as of 05/28/2016.    ALLERGIES: Allergies  Allergen Reactions  . Ciprofloxacin Itching and Nausea And Vomiting  . Sulfonamide Derivatives Hives  . Bee Venom Swelling and Rash  . Pravastatin Rash    "swelling in legs/arms, bumps all over"    VACCINATION STATUS: Immunization History  Administered Date(s) Administered  . Influenza,inj,Quad PF,36+ Mos 03/20/2014, 09/19/2014    Diabetes  She presents for her follow-up diabetic visit. She has type 2 diabetes mellitus. Onset time: She was diagnosed at approximate age of 14 years. Her disease course has been improving. There are no hypoglycemic associated symptoms. Pertinent negatives for hypoglycemia include no confusion, headaches, pallor or seizures. There are no diabetic associated symptoms. Pertinent negatives for diabetes include no chest pain, no polydipsia, no polyphagia and no  polyuria. There are no hypoglycemic complications. Symptoms are improving. Risk factors for coronary artery disease include dyslipidemia, diabetes mellitus, hypertension, family history, obesity, sedentary lifestyle and tobacco exposure. Current diabetic treatment includes oral agent (dual therapy). She is compliant with treatment most of the time. Her weight is decreasing steadily (She has lost 15 pounds since last visit in May 2016.). She is following a diabetic diet. When asked about meal planning, she reported none. She participates in exercise intermittently. An ACE inhibitor/angiotensin II receptor blocker is being taken. Eye exam is current.  Hyperlipidemia  This is a chronic problem. The current episode started more than 1 year ago. Exacerbating diseases include diabetes, hypothyroidism and obesity. Pertinent negatives include no chest pain, myalgias or shortness of breath. Current antihyperlipidemic treatment includes statins. Risk factors for coronary artery disease include diabetes mellitus, dyslipidemia, hypertension, obesity and a sedentary lifestyle.  Hypertension  This is a  chronic problem. The current episode started more than 1 year ago. Pertinent negatives include no chest pain, headaches, palpitations or shortness of breath. Risk factors for coronary artery disease include dyslipidemia, diabetes mellitus, obesity and sedentary lifestyle. Past treatments include ACE inhibitors and beta blockers. The current treatment provides moderate improvement.    Review of Systems  Constitutional: Negative for chills, fever and unexpected weight change.  HENT: Negative for trouble swallowing and voice change.   Eyes: Negative for visual disturbance.  Respiratory: Negative for cough, shortness of breath and wheezing.   Cardiovascular: Negative for chest pain, palpitations and leg swelling.  Gastrointestinal: Negative for diarrhea, nausea and vomiting.  Endocrine: Negative for cold intolerance, heat  intolerance, polydipsia, polyphagia and polyuria.  Musculoskeletal: Negative for arthralgias and myalgias.  Skin: Negative for color change, pallor, rash and wound.  Neurological: Negative for seizures and headaches.  Psychiatric/Behavioral: Negative for confusion and suicidal ideas.    Objective:    BP 118/84   Pulse 76   Ht 5\' 9"  (1.753 m)   Wt 262 lb (118.8 kg)   BMI 38.69 kg/m   Wt Readings from Last 3 Encounters:  05/28/16 262 lb (118.8 kg)  05/27/16 243 lb (110.2 kg)  11/27/15 266 lb (120.7 kg)    Physical Exam  Constitutional: She is oriented to person, place, and time. She appears well-developed.  HENT:  Head: Normocephalic and atraumatic.  Eyes: EOM are normal.  Neck: Normal range of motion. Neck supple. No tracheal deviation present. No thyromegaly present.  Cardiovascular: Normal rate and regular rhythm.   Pulmonary/Chest: Effort normal and breath sounds normal.  Abdominal: Soft. Bowel sounds are normal. There is no tenderness. There is no guarding.  Musculoskeletal: Normal range of motion. She exhibits no edema.  Neurological: She is alert and oriented to person, place, and time. She has normal reflexes. No cranial nerve deficit. Coordination normal.  Skin: Skin is warm and dry. No rash noted. No erythema. No pallor.  Psychiatric: She has a normal mood and affect. Judgment normal.    Recent Results (from the past 2160 hour(s))  COMPLETE METABOLIC PANEL WITH GFR     Status: Abnormal   Collection Time: 05/25/16  3:21 PM  Result Value Ref Range   Sodium 139 135 - 146 mmol/L   Potassium 4.8 3.5 - 5.3 mmol/L   Chloride 104 98 - 110 mmol/L   CO2 23 20 - 31 mmol/L   Glucose, Bld 106 (H) 65 - 99 mg/dL   BUN 15 7 - 25 mg/dL   Creat 1.02 0.50 - 1.05 mg/dL    Comment:   For patients > or = 53 years of age: The upper reference limit for Creatinine is approximately 13% higher for people identified as African-American.      Total Bilirubin 1.1 0.2 - 1.2 mg/dL    Alkaline Phosphatase 73 33 - 130 U/L   AST 46 (H) 10 - 35 U/L   ALT 52 (H) 6 - 29 U/L   Total Protein 7.1 6.1 - 8.1 g/dL   Albumin 4.2 3.6 - 5.1 g/dL   Calcium 9.7 8.6 - 10.4 mg/dL   GFR, Est African American 72 >=60 mL/min   GFR, Est Non African American 63 >=60 mL/min  Microalbumin / creatinine urine ratio     Status: None   Collection Time: 05/25/16  3:21 PM  Result Value Ref Range   Creatinine, Urine 290 20 - 320 mg/dL   Microalb, Ur 2.1 Not estab mg/dL   Microalb  Creat Ratio 7 <30 mcg/mg creat    Comment: The ADA has defined abnormalities in albumin excretion as follows:           Category           Result                            (mcg/mg creatinine)                 Normal:    <30       Microalbuminuria:    30 - 299   Clinical albuminuria:    > or = 300   The ADA recommends that at least two of three specimens collected within a 3 - 6 month period be abnormal before considering a patient to be within a diagnostic category.     Lipid panel     Status: Abnormal   Collection Time: 05/25/16  3:21 PM  Result Value Ref Range   Cholesterol 199 <200 mg/dL    Comment: ** Please note change in reference range(s). **      Triglycerides 151 (H) <150 mg/dL    Comment: ** Please note change in reference range(s). **      HDL 49 (L) >50 mg/dL    Comment: ** Please note change in reference range(s). **      Total CHOL/HDL Ratio 4.1 <5.0 Ratio   VLDL 30 <30 mg/dL   LDL Cholesterol 120 (H) <100 mg/dL    Comment: ** Please note change in reference range(s). **     TSH     Status: None   Collection Time: 05/25/16  3:21 PM  Result Value Ref Range   TSH 1.02 mIU/L    Comment:   Reference Range   > or = 20 Years  0.40-4.50   Pregnancy Range First trimester  0.26-2.66 Second trimester 0.55-2.73 Third trimester  0.43-2.91     T4, free     Status: None   Collection Time: 05/25/16  3:21 PM  Result Value Ref Range   Free T4 1.1 0.8 - 1.8 ng/dL  Hemoglobin A1c     Status:  Abnormal   Collection Time: 05/25/16  3:21 PM  Result Value Ref Range   Hgb A1c MFr Bld 5.8 (H) <5.7 %    Comment:   For someone without known diabetes, a hemoglobin A1c value between 5.7% and 6.4% is consistent with prediabetes and should be confirmed with a follow-up test.   For someone with known diabetes, a value <7% indicates that their diabetes is well controlled. A1c targets should be individualized based on duration of diabetes, age, co-morbid conditions and other considerations.   This assay result is consistent with an increased risk of diabetes.   Currently, no consensus exists regarding use of hemoglobin A1c for diagnosis of diabetes in children.      Mean Plasma Glucose 120 mg/dL        Assessment & Plan:   1. Uncontrolled type 2 diabetes mellitus with complication, without long-term current use of insulin ([place])   - Patient has currently controlled symptomatic type 2 DM since  55 years of age,  with most recent A1c of 5.8% improving from 6.5 %. Recent labs reviewed.  -  patient remains at a high risk for more acute and chronic complications of diabetes which include CAD, CVA, CKD, retinopathy, and neuropathy. These are all discussed in detail with the patient.  - I have  counseled the patient on diet management and weight loss, by adopting a carbohydrate restricted/protein rich diet.  - Suggestion is made for patient to avoid simple carbohydrates   from their diet including Cakes , Desserts, Ice Cream,  Soda (  diet and regular) , Sweet Tea , Candies,  Chips, Cookies, Artificial Sweeteners,   and "Sugar-free" Products . This will help patient to have stable blood glucose profile and potentially avoid unintended weight gain.  - I encouraged the patient to switch to  unprocessed or minimally processed complex starch and increased protein intake (animal or plant source), fruits, and vegetables.  - Patient is advised to stick to a routine mealtimes to eat 3 meals  a  day and avoid unnecessary snacks ( to snack only to correct hypoglycemia).  - The patient will be scheduled with Jearld Fenton, RDN, CDE for individualized DM education.  - I have approached patient with the following individualized plan to manage diabetes and patient agrees:   - Based on her progress associated with 17 pounds weight loss , overall since last year and A1c of  5.8% improving from 6.5%, she will not need insulin treatment for now. - I will continue metformin 1000 g by mouth twice a day and invokana 100 mg by mouth every morning , therapeutically suitable for patient. -Side effects and precautions are discussed with her. - Patient will be considered for incretin therapy if she loses control on subsequent visits. - Patient specific target  A1c;  LDL, HDL, Triglycerides, and  Waist Circumference were discussed in detail.  2) BP/HTN: Controllednue current medications including ACEI/ARB. 3) Lipids/HPL:  Controlled unknown, I will obtain fasting lipid panel, I advised her to continue statins. 4)  Weight/Diet: CDE Consult will be initiated , exercise, and detailed carbohydrates information provided.   5)  hypothyroidism: Her thyroid function tests are consistent with appropriate replacement, however she would benefit from slight increase in her levothyroxine. I will prescribe levothyroxine 100 g by mouth every morning.  - We discussed about correct intake of levothyroxine, at fasting, with water, separated by at least 30 minutes from breakfast, and separated by more than 4 hours from calcium, iron, multivitamins, acid reflux medications (PPIs). -Patient is made aware of the fact that thyroid hormone replacement is needed for life, dose to be adjusted by periodic monitoring of thyroid function tests.  6) Chronic Care/Health Maintenance:  -Patient  on ACEI/ARB and Statin medications and encouraged to continue to follow up with Ophthalmology, Podiatrist at least yearly or according to  recommendations, and advised to   stay away from smoking. I have recommended yearly flu vaccine and pneumonia vaccination at least every 5 years; moderate intensity exercise for up to 150 minutes weekly; and  sleep for at least 7 hours a day.  - 40 minutes of time was spent on the care of this patient , 50% of which was applied for counseling on diabetes complications and their preventions.  - Patient to bring meter and  blood glucose logs during their next visit.   - I advised patient to maintain close follow up with Neale Burly, MD for primary care needs.  Follow up plan: - Return in about 3 months (around 08/26/2016) for follow up with pre-visit labs.  Glade Lloyd, MD Phone: [phone]  Fax: [phone]   05/28/2016, 12:14 PM

## 2016-06-03 ENCOUNTER — Telehealth (INDEPENDENT_AMBULATORY_CARE_PROVIDER_SITE_OTHER): Payer: Self-pay | Admitting: *Deleted

## 2016-06-03 NOTE — Telephone Encounter (Signed)
Pt has appt scheduled at [facility] on Mon Dec 18 at 12:15pm, pt is to arrive 11:45am to register, Left message on vm to return call, pending call back from pt

## 2016-06-04 NOTE — Telephone Encounter (Signed)
Left message on pt vm with appt date and time

## 2016-06-07 ENCOUNTER — Encounter: Payer: Self-pay | Admitting: Orthopaedic Surgery

## 2016-06-10 ENCOUNTER — Ambulatory Visit (INDEPENDENT_AMBULATORY_CARE_PROVIDER_SITE_OTHER): Payer: Medicaid Other | Admitting: Orthopaedic Surgery

## 2016-06-18 ENCOUNTER — Other Ambulatory Visit: Payer: Self-pay | Admitting: "Endocrinology

## 2016-08-26 ENCOUNTER — Ambulatory Visit: Payer: Medicaid Other | Admitting: "Endocrinology

## 2016-08-27 ENCOUNTER — Other Ambulatory Visit: Payer: Self-pay | Admitting: "Endocrinology

## 2016-08-27 LAB — COMPREHENSIVE METABOLIC PANEL
ALBUMIN: 4.1 g/dL (ref 3.6–5.1)
ALK PHOS: 64 U/L (ref 33–130)
ALT: 26 U/L (ref 6–29)
AST: 21 U/L (ref 10–35)
BILIRUBIN TOTAL: 1.1 mg/dL (ref 0.2–1.2)
BUN: 16 mg/dL (ref 7–25)
CALCIUM: 9.3 mg/dL (ref 8.6–10.4)
CO2: 22 mmol/L (ref 20–31)
CREATININE: 0.86 mg/dL (ref 0.50–1.05)
Chloride: 108 mmol/L (ref 98–110)
Glucose, Bld: 103 mg/dL — ABNORMAL HIGH (ref 65–99)
Potassium: 4 mmol/L (ref 3.5–5.3)
Sodium: 139 mmol/L (ref 135–146)
TOTAL PROTEIN: 6.9 g/dL (ref 6.1–8.1)

## 2016-08-27 LAB — TSH: TSH: 3.46 mIU/L

## 2016-08-27 LAB — T4, FREE: FREE T4: 1 ng/dL (ref 0.8–1.8)

## 2016-08-28 LAB — HEMOGLOBIN A1C
HEMOGLOBIN A1C: 6.1 % — AB (ref <5.7)
Mean Plasma Glucose: 128 mg/dL

## 2016-09-02 ENCOUNTER — Encounter: Payer: Self-pay | Admitting: "Endocrinology

## 2016-09-02 ENCOUNTER — Other Ambulatory Visit (INDEPENDENT_AMBULATORY_CARE_PROVIDER_SITE_OTHER): Payer: Self-pay | Admitting: Internal Medicine

## 2016-09-02 ENCOUNTER — Ambulatory Visit (INDEPENDENT_AMBULATORY_CARE_PROVIDER_SITE_OTHER): Payer: Medicaid Other | Admitting: "Endocrinology

## 2016-09-02 VITALS — BP 116/72 | HR 62 | Ht 69.0 in | Wt 262.0 lb

## 2016-09-02 DIAGNOSIS — I10 Essential (primary) hypertension: Secondary | ICD-10-CM

## 2016-09-02 DIAGNOSIS — IMO0002 Reserved for concepts with insufficient information to code with codable children: Secondary | ICD-10-CM

## 2016-09-02 DIAGNOSIS — E1165 Type 2 diabetes mellitus with hyperglycemia: Secondary | ICD-10-CM

## 2016-09-02 DIAGNOSIS — E782 Mixed hyperlipidemia: Secondary | ICD-10-CM

## 2016-09-02 DIAGNOSIS — E118 Type 2 diabetes mellitus with unspecified complications: Secondary | ICD-10-CM

## 2016-09-02 DIAGNOSIS — E038 Other specified hypothyroidism: Secondary | ICD-10-CM | POA: Diagnosis not present

## 2016-09-02 MED ORDER — LEVOTHYROXINE SODIUM 125 MCG PO TABS: 125.0000 ug | ORAL_TABLET | Freq: Every day | ORAL | 3 refills | Status: DC

## 2016-09-02 NOTE — Progress Notes (Signed)
Subjective:    Patient ID: Amber Fischer, female    DOB: 15-May-1961. Patient is being seen in Follow-up for management of diabetes , hypothyroidism requested by  Neale Burly, MD  Past Medical History:  Diagnosis Date  . Asthma   . Back pain, chronic   . COPD (chronic obstructive pulmonary disease) ([place])   . Coronary atherosclerosis of native coronary artery    Prior stent RCA - patent May 2014 with otherwise nonobstructive disease  . Epigastric abdominal pain 08/19/2010  . Essential hypertension, benign   . Fibromyalgia   . OCD (obsessive compulsive disorder)   . Rectal bleed 11/02/2010  . Sciatica   . Type 2 diabetes mellitus ([place])    Past Surgical History:  Procedure Laterality Date  . ABDOMINAL HYSTERECTOMY    . CHOLECYSTECTOMY    . COLONOSCOPY  11/30/2010  . COLONOSCOPY W/ BIOPSIES  12/24/2010   NUR  . COLONOSCOPY, ESOPHAGOGASTRODUODENOSCOPY (EGD) AND ESOPHAGEAL DILATION N/A 04/05/2013   Procedure: COLONOSCOPY, ESOPHAGOGASTRODUODENOSCOPY (EGD) AND ESOPHAGEAL DILATION;  Surgeon: Rogene Houston, MD;  [facility];  Service: Endoscopy;  Laterality: N/A;  . ESOPHAGEAL DILATION N/A 08/14/2014   Procedure: ESOPHAGEAL DILATION;  Surgeon: Rogene Houston, MD;  Location: [facility];  Service: Endoscopy;  Laterality: N/A;  . ESOPHAGOGASTRODUODENOSCOPY  03/22/2012   Procedure: ESOPHAGOGASTRODUODENOSCOPY (EGD);  Surgeon: Rogene Houston, MD;  [facility]: [facility];  Service: Endoscopy;  Laterality: N/A;  300  . ESOPHAGOGASTRODUODENOSCOPY N/A 08/14/2014   Procedure: ESOPHAGOGASTRODUODENOSCOPY (EGD);  Surgeon: Rogene Houston, MD;  [facility]: [facility];  Service: Endoscopy;  Laterality: N/A;  225   Social History   Social History  . Marital status: Divorced    Spouse name: N/A  . Number of children: N/A  . Years of education: N/A   Social History Main Topics  . Smoking status: Former Smoker    Packs/day: 0.50    Years: 20.00    Types: Cigarettes    Start  date: 08/16/1977    Quit date: 09/21/2011  . Smokeless tobacco: Never Used     Comment: Quit 1yr ago after smoking 12 yrs  . Alcohol use No     Comment: rare  . Drug use: No  . Sexual activity: Not on file   Other Topics Concern  . Not on file   Social History Narrative  . No narrative on file   Outpatient Encounter Prescriptions as of 09/02/2016  Medication Sig  . insulin aspart (NOVOLOG) 100 UNIT/ML injection Inject into the skin.  Marland Kitchen albuterol (PROAIR HFA) 108 (90 BASE) MCG/ACT inhaler Inhale 2 puffs into the lungs every 6 (six) hours as needed for wheezing or shortness of breath.  Marland Kitchen aspirin EC 81 MG tablet Take 81 mg by mouth daily.  . benazepril (LOTENSIN) 20 MG tablet Take 20 mg by mouth daily.  . cetirizine (ZYRTEC) 10 MG tablet Take 10 mg by mouth daily.  . DULoxetine (CYMBALTA) 30 MG capsule Take 30 mg by mouth daily.  Marland Kitchen ezetimibe (ZETIA) 10 MG tablet TAKE 1 TABLET BY MOUTH EVERY DAY.  . fesoterodine (TOVIAZ) 4 MG TB24 tablet Take 4 mg by mouth daily.  Marland Kitchen HYDROcodone-acetaminophen (NORCO) 7.5-325 MG per tablet Take 1 tablet by mouth every 6 (six) hours as needed for moderate pain or severe pain.   . INVOKANA 100 MG TABS tablet TAKE 1 TABLET BY MOUTH EVERY DAY  . levothyroxine (SYNTHROID, LEVOTHROID) 125 MCG tablet Take 1 tablet (125 mcg total) by mouth daily  before breakfast.  . LORazepam (ATIVAN) 1 MG tablet Take 1 mg by mouth 3 (three) times daily.  . metFORMIN (GLUCOPHAGE) 1000 MG tablet TAKE 1 TABLET BY MOUTH TWICE DAILY  . metoCLOPramide (REGLAN) 10 MG tablet TAKE 1 TABLET (10 MG TOTAL) BY MOUTH 3 (THREE) TIMES DAILY BEFORE MEALS.  . metoprolol (LOPRESSOR) 50 MG tablet Take 50 mg by mouth 2 (two) times daily.  Marland Kitchen NEXIUM 40 MG capsule TAKE ONE CAPSULE BY MOUTH TWICE A DAY  . nitroGLYCERIN (NITROSTAT) 0.4 MG SL tablet Place 1 tablet (0.4 mg total) under the tongue every 5 (five) minutes as needed for chest pain.  . polyethylene glycol powder (GLYCOLAX/MIRALAX) powder Take 17 g  by mouth daily.  . pregabalin (LYRICA) 100 MG capsule Take 100 mg by mouth 3 (three) times daily.  . sertraline (ZOLOFT) 100 MG tablet Take 100 mg by mouth daily.   . SUMAtriptan (IMITREX) 100 MG tablet TAKE 1 TABLET WITH FLUIDS AS SOON AS POSSIBLE AFTER ONSET OF MIGRAINE ATTACK/MAY REPEAT AFTER 2 HOUR  . tiotropium (SPIRIVA HANDIHALER) 18 MCG inhalation capsule Place 1 capsule (18 mcg total) into inhaler and inhale daily.  Marland Kitchen topiramate (TOPAMAX) 100 MG tablet Take 100 mg by mouth 2 (two) times daily.  . [DISCONTINUED] levothyroxine (SYNTHROID, LEVOTHROID) 100 MCG tablet Take 1 tablet (100 mcg total) by mouth daily before breakfast.   No facility-administered encounter medications on file as of 09/02/2016.    ALLERGIES: Allergies  Allergen Reactions  . Ciprofloxacin Itching and Nausea And Vomiting  . Sulfonamide Derivatives Hives  . Bee Venom Swelling and Rash  . Pravastatin Rash    "swelling in legs/arms, bumps all over"    VACCINATION STATUS: Immunization History  Administered Date(s) Administered  . Influenza,inj,Quad PF,36+ Mos 03/20/2014, 09/19/2014    Diabetes  She presents for her follow-up diabetic visit. She has type 2 diabetes mellitus. Onset time: She was diagnosed at approximate age of 39 years. Her disease course has been improving. There are no hypoglycemic associated symptoms. Pertinent negatives for hypoglycemia include no confusion, headaches, pallor or seizures. There are no diabetic associated symptoms. Pertinent negatives for diabetes include no chest pain, no polydipsia, no polyphagia and no polyuria. There are no hypoglycemic complications. Symptoms are improving. Risk factors for coronary artery disease include dyslipidemia, diabetes mellitus, hypertension, family history, obesity, sedentary lifestyle and tobacco exposure. Current diabetic treatment includes oral agent (dual therapy). She is compliant with treatment most of the time. Her weight is decreasing steadily  (She has lost 15 pounds since last visit in May 2016.). She is following a diabetic diet. When asked about meal planning, she reported none. She participates in exercise intermittently. An ACE inhibitor/angiotensin II receptor blocker is being taken. Eye exam is current.  Hyperlipidemia  This is a chronic problem. The current episode started more than 1 year ago. Exacerbating diseases include diabetes, hypothyroidism and obesity. Pertinent negatives include no chest pain, myalgias or shortness of breath. Current antihyperlipidemic treatment includes statins. Risk factors for coronary artery disease include diabetes mellitus, dyslipidemia, hypertension, obesity and a sedentary lifestyle.  Hypertension  This is a chronic problem. The current episode started more than 1 year ago. Pertinent negatives include no chest pain, headaches, palpitations or shortness of breath. Risk factors for coronary artery disease include dyslipidemia, diabetes mellitus, obesity and sedentary lifestyle. Past treatments include ACE inhibitors and beta blockers. The current treatment provides moderate improvement.    Review of Systems  Constitutional: Negative for chills, fever and unexpected weight change.  HENT: Negative for trouble swallowing and voice change.   Eyes: Negative for visual disturbance.  Respiratory: Negative for cough, shortness of breath and wheezing.   Cardiovascular: Negative for chest pain, palpitations and leg swelling.  Gastrointestinal: Negative for diarrhea, nausea and vomiting.  Endocrine: Negative for cold intolerance, heat intolerance, polydipsia, polyphagia and polyuria.  Musculoskeletal: Negative for arthralgias and myalgias.  Skin: Negative for color change, pallor, rash and wound.  Neurological: Negative for seizures and headaches.  Psychiatric/Behavioral: Negative for confusion and suicidal ideas.    Objective:    BP 116/72   Pulse 62   Ht 5\' 9"  (1.753 m)   Wt 262 lb (118.8 kg)    BMI 38.69 kg/m   Wt Readings from Last 3 Encounters:  09/02/16 262 lb (118.8 kg)  05/28/16 262 lb (118.8 kg)  05/27/16 243 lb (110.2 kg)    Physical Exam  Constitutional: She is oriented to person, place, and time. She appears well-developed.  HENT:  Head: Normocephalic and atraumatic.  Eyes: EOM are normal.  Neck: Normal range of motion. Neck supple. No tracheal deviation present. No thyromegaly present.  Cardiovascular: Normal rate and regular rhythm.   Pulmonary/Chest: Effort normal and breath sounds normal.  Abdominal: Soft. Bowel sounds are normal. There is no tenderness. There is no guarding.  Musculoskeletal: Normal range of motion. She exhibits no edema.  Neurological: She is alert and oriented to person, place, and time. She has normal reflexes. No cranial nerve deficit. Coordination normal.  Skin: Skin is warm and dry. No rash noted. No erythema. No pallor.  Psychiatric: She has a normal mood and affect. Judgment normal.    Recent Results (from the past 2160 hour(s))  Comprehensive metabolic panel     Status: Abnormal   Collection Time: 08/27/16 10:59 AM  Result Value Ref Range   Sodium 139 135 - 146 mmol/L   Potassium 4.0 3.5 - 5.3 mmol/L   Chloride 108 98 - 110 mmol/L   CO2 22 20 - 31 mmol/L   Glucose, Bld 103 (H) 65 - 99 mg/dL   BUN 16 7 - 25 mg/dL   Creat 0.86 0.50 - 1.05 mg/dL    Comment:   For patients > or = 55 years of age: The upper reference limit for Creatinine is approximately 13% higher for people identified as African-American.      Total Bilirubin 1.1 0.2 - 1.2 mg/dL   Alkaline Phosphatase 64 33 - 130 U/L   AST 21 10 - 35 U/L   ALT 26 6 - 29 U/L   Total Protein 6.9 6.1 - 8.1 g/dL   Albumin 4.1 3.6 - 5.1 g/dL   Calcium 9.3 8.6 - 10.4 mg/dL  TSH     Status: None   Collection Time: 08/27/16 10:59 AM  Result Value Ref Range   TSH 3.46 mIU/L    Comment:   Reference Range   > or = 20 Years  0.40-4.50   Pregnancy Range First trimester   0.26-2.66 Second trimester 0.55-2.73 Third trimester  0.43-2.91     T4, free     Status: None   Collection Time: 08/27/16 10:59 AM  Result Value Ref Range   Free T4 1.0 0.8 - 1.8 ng/dL  Hemoglobin A1c     Status: Abnormal   Collection Time: 08/27/16 10:59 AM  Result Value Ref Range   Hgb A1c MFr Bld 6.1 (H) <5.7 %    Comment:   For someone without known diabetes, a hemoglobin A1c value between  5.7% and 6.4% is consistent with prediabetes and should be confirmed with a follow-up test.   For someone with known diabetes, a value <7% indicates that their diabetes is well controlled. A1c targets should be individualized based on duration of diabetes, age, co-morbid conditions and other considerations.   This assay result is consistent with an increased risk of diabetes.   Currently, no consensus exists regarding use of hemoglobin A1c for diagnosis of diabetes in children.      Mean Plasma Glucose 128 mg/dL        Assessment & Plan:   1. Uncontrolled type 2 diabetes mellitus with complication, without long-term current use of insulin ([place])   - Patient has currently controlled symptomatic type 2 DM since  56 years of age. - She came with controlled glycemia with A1c of 6.1%, overall improving from 6.5 %. Recent labs reviewed.  -  patient remains at a high risk for more acute and chronic complications of diabetes which include CAD, CVA, CKD, retinopathy, and neuropathy. These are all discussed in detail with the patient.  - I have counseled the patient on diet management and weight loss, by adopting a carbohydrate restricted/protein rich diet.  - Suggestion is made for patient to avoid simple carbohydrates   from their diet including Cakes , Desserts, Ice Cream,  Soda (  diet and regular) , Sweet Tea , Candies,  Chips, Cookies, Artificial Sweeteners,   and "Sugar-free" Products . This will help patient to have stable blood glucose profile and potentially avoid unintended  weight gain.  - I encouraged the patient to switch to  unprocessed or minimally processed complex starch and increased protein intake (animal or plant source), fruits, and vegetables.  - Patient is advised to stick to a routine mealtimes to eat 3 meals  a day and avoid unnecessary snacks ( to snack only to correct hypoglycemia).  - The patient will be scheduled with Jearld Fenton, RDN, CDE for individualized DM education.  - I have approached patient with the following individualized plan to manage diabetes and patient agrees:   - Based on her progress , she will not need insulin treatment for now. - I will continue metformin 1000 g by mouth twice a day and invokana 100 mg by mouth every morning , therapeutically suitable for patient. -Side effects and precautions are discussed with her. - Patient will be considered for incretin therapy if she loses control on subsequent visits. - Patient specific target  A1c;  LDL, HDL, Triglycerides, and  Waist Circumference were discussed in detail.  2) BP/HTN: Controllednue current medications including ACEI/ARB. 3) Lipids/HPL:  Controlled unknown, I will obtain fasting lipid panel, I advised her to continue statins. 4)  Weight/Diet: CDE Consult will be initiated , exercise, and detailed carbohydrates information provided.   5)  hypothyroidism:  she will benefit from slight increase in her levothyroxine. I will prescribe levothyroxine 125 g by mouth every morning.  - We discussed about correct intake of levothyroxine, at fasting, with water, separated by at least 30 minutes from breakfast, and separated by more than 4 hours from calcium, iron, multivitamins, acid reflux medications (PPIs). -Patient is made aware of the fact that thyroid hormone replacement is needed for life, dose to be adjusted by periodic monitoring of thyroid function tests.  6) Chronic Care/Health Maintenance:  -Patient  on ACEI/ARB and Statin medications and encouraged to  continue to follow up with Ophthalmology, Podiatrist at least yearly or according to recommendations, and advised to  stay away from smoking. I have recommended yearly flu vaccine and pneumonia vaccination at least every 5 years; moderate intensity exercise for up to 150 minutes weekly; and  sleep for at least 7 hours a day.  - 40 minutes of time was spent on the care of this patient , 50% of which was applied for counseling on diabetes complications and their preventions.  - Patient to bring meter and  blood glucose logs during their next visit.   - I advised patient to maintain close follow up with Neale Burly, MD for primary care needs.  Follow up plan: - Return in about 3 months (around 12/03/2016) for follow up with pre-visit labs.  Glade Lloyd, MD Phone: [phone]  Fax: [phone]   09/02/2016, 1:58 PM

## 2016-09-02 NOTE — Telephone Encounter (Signed)
Patient will need to have an appointment prior to further refills . She has not been seen in the office since 2016. Patient may also get the from her PCP .

## 2016-09-02 NOTE — Patient Instructions (Signed)

## 2016-09-06 ENCOUNTER — Telehealth: Payer: Self-pay

## 2016-09-06 ENCOUNTER — Encounter (INDEPENDENT_AMBULATORY_CARE_PROVIDER_SITE_OTHER): Payer: Self-pay | Admitting: Internal Medicine

## 2016-09-06 NOTE — Telephone Encounter (Signed)
Pt states that her invokana was increased?

## 2016-09-06 NOTE — Telephone Encounter (Signed)
Pt.notified

## 2016-09-06 NOTE — Telephone Encounter (Signed)
Patient was given an appointment for 09/30/16 at 3:30pm with Deberah Castle, NP.  A letter was sent to the patient.

## 2016-09-06 NOTE — Telephone Encounter (Signed)
No it was not increased , kept same at 100mg  po qam.

## 2016-09-29 ENCOUNTER — Ambulatory Visit (INDEPENDENT_AMBULATORY_CARE_PROVIDER_SITE_OTHER): Payer: Medicaid Other | Admitting: Internal Medicine

## 2016-09-30 ENCOUNTER — Ambulatory Visit (INDEPENDENT_AMBULATORY_CARE_PROVIDER_SITE_OTHER): Payer: Medicaid Other | Admitting: Internal Medicine

## 2016-12-07 ENCOUNTER — Ambulatory Visit: Payer: Medicaid Other | Admitting: "Endocrinology

## 2016-12-17 ENCOUNTER — Other Ambulatory Visit: Payer: Self-pay | Admitting: Cardiovascular Disease

## 2016-12-17 ENCOUNTER — Other Ambulatory Visit: Payer: Self-pay | Admitting: "Endocrinology

## 2017-01-18 ENCOUNTER — Other Ambulatory Visit: Payer: Self-pay | Admitting: "Endocrinology

## 2017-01-18 ENCOUNTER — Other Ambulatory Visit: Payer: Self-pay | Admitting: Cardiovascular Disease

## 2017-02-01 ENCOUNTER — Encounter: Payer: Self-pay | Admitting: Cardiology

## 2017-02-01 ENCOUNTER — Encounter: Payer: Self-pay | Admitting: Cardiovascular Disease

## 2017-02-23 ENCOUNTER — Other Ambulatory Visit: Payer: Self-pay | Admitting: Cardiovascular Disease

## 2017-03-01 ENCOUNTER — Other Ambulatory Visit (INDEPENDENT_AMBULATORY_CARE_PROVIDER_SITE_OTHER): Payer: Self-pay | Admitting: Internal Medicine

## 2017-03-23 ENCOUNTER — Other Ambulatory Visit: Payer: Self-pay | Admitting: Cardiovascular Disease

## 2017-04-11 ENCOUNTER — Encounter (INDEPENDENT_AMBULATORY_CARE_PROVIDER_SITE_OTHER): Payer: Self-pay

## 2017-04-11 ENCOUNTER — Ambulatory Visit (INDEPENDENT_AMBULATORY_CARE_PROVIDER_SITE_OTHER): Payer: Medicaid Other | Admitting: Neurology

## 2017-04-11 ENCOUNTER — Encounter: Payer: Self-pay | Admitting: Neurology

## 2017-04-11 VITALS — BP 126/76 | HR 69 | Resp 20 | Wt 260.2 lb

## 2017-04-11 DIAGNOSIS — G4733 Obstructive sleep apnea (adult) (pediatric): Secondary | ICD-10-CM | POA: Diagnosis not present

## 2017-04-11 DIAGNOSIS — R413 Other amnesia: Secondary | ICD-10-CM | POA: Diagnosis not present

## 2017-04-11 DIAGNOSIS — R404 Transient alteration of awareness: Secondary | ICD-10-CM

## 2017-04-11 NOTE — Progress Notes (Signed)
[facility]    Provider:  Dr Jaynee Eagles Referring Provider: Neale Burly, MD Primary Care Physician:  Neale Burly, MD  CC:  Mild cognitive impairment  HPI:  Amber Fischer is a 56 y.o. female here as a referral from Dr. Sherrie Sport for mild cognitive impairment. PMHx DM2, sciatica, OCD, Fibromyalgia, HTN, CAD, COPD, back pain, asthma. Her family is worried. Symptoms started 18 months ago. She will get in the car and forget where she is going, she will pull off and sit for a while and then she remembers, she will go to the store and forget to bring it with her, she forgets to take her medicine she goes days without her medicine. She just completely forgets. She has had a lot of depression and trouble with boyfriend. She is having constipation, no bowel movements for weeks, lots of GI problems.This is "tearing me apart". She says she bleeds red out of her rectuma nd she follows with GI and has been diagnosed with hemorrhoids, chronic severe pain in her rectum like an animal is eating its way, she has chronic back pain, her whole entire back, neck shoulders. She has depression but she feels her mood is better. She has insomnia, she will be awake at 6-7am in the morning. She has untreated sleep apnea.. Sleep study was n August 2015. She is fatigued during the day and falls asleep. She has confusion, she will stop in the middle of the raod and not know what she is doing. Grandmother with dementia in her 38s. Mother has no memory loss. Mother has had strokes.    Reviewed notes, labs and imaging from outside physicians, which showed:  Patient is divorced. Has 3 children. Her highest education is grammar scholl and she is disabled. Former smoker, quit 2 years ago. No alcohol. No exercise. No drugs. She has been having memory problems over 3 months. Exam normal. She did report mental disturbance.   Review of Systems: Patient complains of symptoms per HPI as well as the following symptoms:  memory loss, confusion, headaches, numbness, insomnia, RLS, joint pain, aching muscles. Pertinent negatives and positives per HPI. All others negative.   Social History   Social History  . Marital status: Divorced    Spouse name: N/A  . Number of children: 3  . Years of education: GED   Occupational History  . Not on file.   Social History Main Topics  . Smoking status: Former Smoker    Packs/day: 0.50    Years: 20.00    Types: Cigarettes    Start date: 08/16/1977    Quit date: 09/21/2011  . Smokeless tobacco: Never Used     Comment: Quit 47yr ago after smoking 12 yrs  . Alcohol use No     Comment: rare  . Drug use: No  . Sexual activity: Not on file   Other Topics Concern  . Not on file   Social History Narrative   Lives at home by herself   Her daughter manages her medications as much as she can   Right handed   Drinks 1-2 cups caffeine daily          Family History  Problem Relation Age of Onset  . Stroke Mother   . Memory loss Mother   . Fibromyalgia Mother   . Diabetes Father   . Kidney disease Father   . Multiple sclerosis Sister   . Autoimmune disease Daughter   . Fibromyalgia Daughter   . Fibromyalgia Daughter   .  Fibromyalgia Son   . Diabetes Son     Past Medical History:  Diagnosis Date  . Asthma   . Back pain, chronic   . COPD (chronic obstructive pulmonary disease) ([place])   . Coronary atherosclerosis of native coronary artery    Prior stent RCA - patent May 2014 with otherwise nonobstructive disease  . Epigastric abdominal pain 08/19/2010  . Essential hypertension, benign   . Fibromyalgia   . OCD (obsessive compulsive disorder)   . Rectal bleed 11/02/2010  . Sciatica   . Type 2 diabetes mellitus ([place])     Past Surgical History:  Procedure Laterality Date  . ABDOMINAL HYSTERECTOMY    . CHOLECYSTECTOMY    . COLONOSCOPY  11/30/2010  . COLONOSCOPY W/ BIOPSIES  12/24/2010   NUR  . COLONOSCOPY, ESOPHAGOGASTRODUODENOSCOPY (EGD) AND  ESOPHAGEAL DILATION N/A 04/05/2013   Procedure: COLONOSCOPY, ESOPHAGOGASTRODUODENOSCOPY (EGD) AND ESOPHAGEAL DILATION;  Surgeon: Rogene Houston, MD;  [facility]: [facility];  Service: Endoscopy;  Laterality: N/A;  . ESOPHAGEAL DILATION N/A 08/14/2014   Procedure: ESOPHAGEAL DILATION;  Surgeon: Rogene Houston, MD;  [facility]: [facility];  Service: Endoscopy;  Laterality: N/A;  . ESOPHAGOGASTRODUODENOSCOPY  03/22/2012   Procedure: ESOPHAGOGASTRODUODENOSCOPY (EGD);  Surgeon: Rogene Houston, MD;  Location: [facility];  Service: Endoscopy;  Laterality: N/A;  300  . ESOPHAGOGASTRODUODENOSCOPY N/A 08/14/2014   Procedure: ESOPHAGOGASTRODUODENOSCOPY (EGD);  Surgeon: Rogene Houston, MD;  Location: [facility];  Service: Endoscopy;  Laterality: N/A;  225    Current Outpatient Prescriptions  Medication Sig Dispense Refill  . albuterol (PROAIR HFA) 108 (90 BASE) MCG/ACT inhaler Inhale 2 puffs into the lungs every 6 (six) hours as needed for wheezing or shortness of breath.    Marland Kitchen aspirin EC 81 MG tablet Take 81 mg by mouth daily.    . benazepril (LOTENSIN) 20 MG tablet Take 20 mg by mouth daily.    . cetirizine (ZYRTEC) 10 MG tablet Take 10 mg by mouth daily.    . DULoxetine (CYMBALTA) 30 MG capsule Take 30 mg by mouth daily.  0  . fesoterodine (TOVIAZ) 4 MG TB24 tablet Take 4 mg by mouth daily.    Marland Kitchen HYDROcodone-acetaminophen (NORCO) 7.5-325 MG per tablet Take 1 tablet by mouth every 6 (six) hours as needed for moderate pain or severe pain.     Marland Kitchen insulin aspart (NOVOLOG) 100 UNIT/ML injection Inject into the skin.    . INVOKANA 100 MG TABS tablet TAKE 1 TABLET BY MOUTH EVERY DAY 90 tablet 0  . isosorbide mononitrate (IMDUR) 30 MG 24 hr tablet Take 30 mg by mouth daily.  0  . levothyroxine (SYNTHROID, LEVOTHROID) 125 MCG tablet TAKE 1 TABLET BY MOUTH ONCE DAILY BEFORE BREAKFAST 30 tablet 2  . LORazepam (ATIVAN) 1 MG tablet Take 1 mg by mouth 3 (three) times daily.  2  . metFORMIN (GLUCOPHAGE) 1000  MG tablet TAKE 1 TABLET BY MOUTH TWICE DAILY 180 tablet 0  . metoCLOPramide (REGLAN) 10 MG tablet TAKE 1 TABLET (10 MG TOTAL) BY MOUTH 3 (THREE) TIMES DAILY BEFORE MEALS. 90 tablet 0  . metoprolol (LOPRESSOR) 50 MG tablet Take 50 mg by mouth 2 (two) times daily.    Marland Kitchen NEXIUM 40 MG capsule TAKE ONE CAPSULE BY MOUTH TWICE A DAY 60 capsule 0  . nitroGLYCERIN (NITROSTAT) 0.4 MG SL tablet Place 1 tablet (0.4 mg total) under the tongue every 5 (five) minutes as needed for chest pain. 25 tablet 3  . polyethylene glycol powder (  GLYCOLAX/MIRALAX) powder Take 17 g by mouth daily.  1  . pregabalin (LYRICA) 100 MG capsule Take 100 mg by mouth 3 (three) times daily.    . sertraline (ZOLOFT) 100 MG tablet Take 100 mg by mouth daily.   2  . SUMAtriptan (IMITREX) 100 MG tablet TAKE 1 TABLET WITH FLUIDS AS SOON AS POSSIBLE AFTER ONSET OF MIGRAINE ATTACK/MAY REPEAT AFTER 2 HOUR  2  . tiotropium (SPIRIVA HANDIHALER) 18 MCG inhalation capsule Place 1 capsule (18 mcg total) into inhaler and inhale daily. 30 capsule 6  . topiramate (TOPAMAX) 100 MG tablet Take 100 mg by mouth 2 (two) times daily.  0  . ZETIA 10 MG tablet TAKE 1 TABLET BY MOUTH EVERY DAY. **NEED TO MAKE APPOINTMENT WITH MD FOR MORE REFILLS** 15 tablet 0   No current facility-administered medications for this visit.     Allergies as of 04/11/2017 - Review Complete 04/11/2017  Allergen Reaction Noted  . Ciprofloxacin Itching and Nausea And Vomiting 05/23/2012  . Sulfonamide derivatives Hives   . Bee venom Swelling and Rash 02/28/2012  . Pravastatin Rash 02/07/2014    Vitals: BP 126/76   Pulse 69   Resp 20 Comment: she reports she took her inhaler before coming.  Wt 260 lb 3.2 oz (118 kg)   BMI 38.42 kg/m  Last Weight:  Wt Readings from Last 1 Encounters:  04/11/17 260 lb 3.2 oz (118 kg)   Last Height:   Ht Readings from Last 1 Encounters:  09/02/16 5\' 9"  (1.753 m)    Physical exam: Exam: Gen: NAD, conversant, well nourised, obese,  well groomed                     CV: RRR, no MRG. No Carotid Bruits. No peripheral edema, warm, nontender Eyes: Conjunctivae clear without exudates or hemorrhage  Neuro: Detailed Neurologic Exam  Speech:    Speech is normal; fluent and spontaneous with normal comprehension.  Cognition:     MMSE - Mini Mental State Exam 04/11/2017  Orientation to time 5  Orientation to Place 5  Registration 3  Attention/ Calculation 5  Recall 1  Language- name 2 objects 2  Language- repeat 1  Language- follow 3 step command 3  Language- read & follow direction 1  Write a sentence 1  Copy design 1  Total score 28   Cranial Nerves:    The pupils are equal, round, and reactive to light. The fundi are normal and spontaneous venous pulsations are present. Visual fields are full to finger confrontation. Extraocular movements are intact. Trigeminal sensation is intact and the muscles of mastication are normal. The face is symmetric. The palate elevates in the midline. Hearing intact. Voice is normal. Shoulder shrug is normal. The tongue has normal motion without fasciculations.   Coordination:    Normal finger to nose and heel to shin. Normal rapid alternating movements.   Gait:    Heel-toe and tandem gait are normal.   Motor Observation:    No asymmetry, no atrophy, and no involuntary movements noted. Tone:    Normal muscle tone.    Posture:    Posture is normal. normal erect    Strength: Mild right diffuse weakness 4+/5 otherwise strength is V/V in the upper and lower limbs.      Sensation: intact to LT     Reflex Exam:  DTR's:    Deep tendon reflexes in the upper extremities are normal bilaterally.  Absent AJ and left patellar, right  patellar trace.  Toes:    The toes are downgoing bilaterally.   Clonus:    Clonus is absent.      Assessment/Plan:  56 year old female with untreated sleep apnea, depression, chronic pain on opioids here for cognitive difficulties and confusion.  Likely multifactorial including all the above. Will check MRi of the brain for other etiologies seizure protocol, EEG and lab workup. Will refer to our sleep physicians.   Cc: Dr. Telford Nab  Orders Placed This Encounter  Procedures  . MR BRAIN W WO CONTRAST  . B12 and Folate Panel  . Methylmalonic acid, serum  . RPR  . Basic Metabolic Panel  . Ambulatory referral to Sleep Studies  . EEG    Cc: Neale Burly, MD    Sarina Ill, MD  [facility] [address] [place], [place] [address]  Phone [phone] Fax [phone]

## 2017-04-11 NOTE — Patient Instructions (Addendum)
Labwork, MRI of the brain to look for strokes or other causes in the brain, EEG to evaluate for seizures, Sleep evaluation for untreated sleep apnea.    Sleep Apnea Sleep apnea is a condition in which breathing pauses or becomes shallow during sleep. Episodes of sleep apnea usually last 10 seconds or longer, and they may occur as many as 20 times an hour. Sleep apnea disrupts your sleep and keeps your body from getting the rest that it needs. This condition can increase your risk of certain health problems, including:  Heart attack.  Stroke.  Obesity.  Diabetes.  Heart failure.  Irregular heartbeat.  There are three kinds of sleep apnea:  Obstructive sleep apnea. This kind is caused by a blocked or collapsed airway.  Central sleep apnea. This kind happens when the part of the brain that controls breathing does not send the correct signals to the muscles that control breathing.  Mixed sleep apnea. This is a combination of obstructive and central sleep apnea.  What are the causes? The most common cause of this condition is a collapsed or blocked airway. An airway can collapse or become blocked if:  Your throat muscles are abnormally relaxed.  Your tongue and tonsils are larger than normal.  You are overweight.  Your airway is smaller than normal.  What increases the risk? This condition is more likely to develop in people who:  Are overweight.  Smoke.  Have a smaller than normal airway.  Are elderly.  Are female.  Drink alcohol.  Take sedatives or tranquilizers.  Have a family history of sleep apnea.  What are the signs or symptoms? Symptoms of this condition include:  Trouble staying asleep.  Daytime sleepiness and tiredness.  Irritability.  Loud snoring.  Morning headaches.  Trouble concentrating.  Forgetfulness.  Decreased interest in sex.  Unexplained sleepiness.  Mood swings.  Personality changes.  Feelings of depression.  Waking up  often during the night to urinate.  Dry mouth.  Sore throat.  How is this diagnosed? This condition may be diagnosed with:  A medical history.  A physical exam.  A series of tests that are done while you are sleeping (sleep study). These tests are usually done in a sleep lab, but they may also be done at home.  How is this treated? Treatment for this condition aims to restore normal breathing and to ease symptoms during sleep. It may involve managing health issues that can affect breathing, such as high blood pressure or obesity. Treatment may include:  Sleeping on your side.  Using a decongestant if you have nasal congestion.  Avoiding the use of depressants, including alcohol, sedatives, and narcotics.  Losing weight if you are overweight.  Making changes to your diet.  Quitting smoking.  Using a device to open your airway while you sleep, such as: ? An oral appliance. This is a custom-made mouthpiece that shifts your lower jaw forward. ? A continuous positive airway pressure (CPAP) device. This device delivers oxygen to your airway through a mask. ? A nasal expiratory positive airway pressure (EPAP) device. This device has valves that you put into each nostril. ? A bi-level positive airway pressure (BPAP) device. This device delivers oxygen to your airway through a mask.  Surgery if other treatments do not work. During surgery, excess tissue is removed to create a wider airway.  It is important to get treatment for sleep apnea. Without treatment, this condition can lead to:  High blood pressure.  Coronary artery disease.  (  Men) An inability to achieve or maintain an erection (impotence).  Reduced thinking abilities.  Follow these instructions at home:  Make any lifestyle changes that your health care provider recommends.  Eat a healthy, well-balanced diet.  Take over-the-counter and prescription medicines only as told by your health care provider.  Avoid  using depressants, including alcohol, sedatives, and narcotics.  Take steps to lose weight if you are overweight.  If you were given a device to open your airway while you sleep, use it only as told by your health care provider.  Do not use any tobacco products, such as cigarettes, chewing tobacco, and e-cigarettes. If you need help quitting, ask your health care provider.  Keep all follow-up visits as told by your health care provider. This is important. Contact a health care provider if:  The device that you received to open your airway during sleep is uncomfortable or does not seem to be working.  Your symptoms do not improve.  Your symptoms get worse. Get help right away if:  You develop chest pain.  You develop shortness of breath.  You develop discomfort in your back, arms, or stomach.  You have trouble speaking.  You have weakness on one side of your body.  You have drooping in your face. These symptoms may represent a serious problem that is an emergency. Do not wait to see if the symptoms will go away. Get medical help right away. Call your local emergency services (911 in the [place] Do not drive yourself to the hospital. This information is not intended to replace advice given to you by your health care provider. Make sure you discuss any questions you have with your health care provider. Document Released: 05/28/2002 Document Revised: 02/01/2016 Document Reviewed: 03/17/2015 Elsevier Interactive Patient Education  [employer].

## 2017-04-12 DIAGNOSIS — G4733 Obstructive sleep apnea (adult) (pediatric): Secondary | ICD-10-CM | POA: Insufficient documentation

## 2017-04-12 HISTORY — DX: Obstructive sleep apnea (adult) (pediatric): G47.33

## 2017-04-14 ENCOUNTER — Other Ambulatory Visit: Payer: Self-pay | Admitting: "Endocrinology

## 2017-04-14 ENCOUNTER — Telehealth: Payer: Self-pay | Admitting: Neurology

## 2017-04-14 LAB — METHYLMALONIC ACID, SERUM: Methylmalonic Acid: 310 nmol/L (ref 0–378)

## 2017-04-14 LAB — BASIC METABOLIC PANEL
BUN / CREAT RATIO: 12 (ref 9–23)
BUN: 12 mg/dL (ref 6–24)
CALCIUM: 9.6 mg/dL (ref 8.7–10.2)
CHLORIDE: 106 mmol/L (ref 96–106)
CO2: 20 mmol/L (ref 20–29)
CREATININE: 0.98 mg/dL (ref 0.57–1.00)
GFR calc Af Amer: 75 mL/min/{1.73_m2} (ref >59)
GFR calc non Af Amer: 65 mL/min/{1.73_m2} (ref >59)
GLUCOSE: 88 mg/dL (ref 65–99)
Potassium: 4.7 mmol/L (ref 3.5–5.2)
Sodium: 143 mmol/L (ref 134–144)

## 2017-04-14 LAB — B12 AND FOLATE PANEL
Folate: 10.7 ng/mL (ref >3.0)
Vitamin B-12: 295 pg/mL (ref 232–1245)

## 2017-04-14 LAB — RPR: RPR: NONREACTIVE

## 2017-04-14 NOTE — Telephone Encounter (Signed)
-----   Message from Melvenia Beam, MD sent at 04/14/2017  2:47 PM EDT ----- Labs normal

## 2017-04-14 NOTE — Telephone Encounter (Signed)
Called patient with her lab results that are listed as normal. LVM with this information and that if the patient has any questions she can call back.

## 2017-04-20 ENCOUNTER — Ambulatory Visit (INDEPENDENT_AMBULATORY_CARE_PROVIDER_SITE_OTHER): Payer: Medicaid Other | Admitting: Neurology

## 2017-04-20 ENCOUNTER — Other Ambulatory Visit: Payer: Self-pay | Admitting: Cardiovascular Disease

## 2017-04-20 ENCOUNTER — Telehealth: Payer: Self-pay | Admitting: *Deleted

## 2017-04-20 DIAGNOSIS — R410 Disorientation, unspecified: Secondary | ICD-10-CM

## 2017-04-20 DIAGNOSIS — R404 Transient alteration of awareness: Secondary | ICD-10-CM

## 2017-04-20 NOTE — Procedures (Signed)
    History:  Amber Fischer is a 56 year old patient with a history of mild cognitive impairment and diabetes.  The patient has had episodes where she may get into the car and forget where she is going.  She may forget to take her medications.  The patient is being evaluated for these cognitive changes.  This is a routine EEG.  No skull defects are noted.  Medications include albuterol, aspirin, Lotensin, Zyrtec, Cymbalta, Toviaz, hydrocodone, insulin, Invokana, Imdur, Ativan, Synthroid, Glucophage, Reglan, Lopressor, nitroglycerin, MiraLAX, Lyrica, Zoloft, Imitrex, Spiriva, Topamax, and Zetia.  EEG classification: Normal awake  Description of the recording: The background rhythms of this recording consists of a fairly well modulated medium amplitude alpha rhythm of 8 Hz that is reactive to eye opening and closure. As the record progresses, the patient appears to remain in the waking state throughout the recording. Photic stimulation was performed, resulting in a bilateral and symmetric photic driving response. Hyperventilation was also performed, resulting in a minimal buildup of the background rhythm activities without significant slowing seen. At no time during the recording does there appear to be evidence of spike or spike wave discharges or evidence of focal slowing. EKG monitor shows no evidence of cardiac rhythm abnormalities with a heart rate of 60.  Impression: This is a normal EEG recording in the waking state. No evidence of ictal or interictal discharges are seen.

## 2017-04-20 NOTE — Telephone Encounter (Signed)
-----   Message from Melvenia Beam, MD sent at 04/20/2017 11:31 AM EDT ----- EEG normal thanks

## 2017-04-20 NOTE — Telephone Encounter (Signed)
Called patient, she verbalized understanding of her normal EEG results and had no questions.

## 2017-05-11 ENCOUNTER — Institutional Professional Consult (permissible substitution): Payer: Medicaid Other | Admitting: Neurology

## 2017-05-13 ENCOUNTER — Other Ambulatory Visit: Payer: Self-pay | Admitting: Cardiovascular Disease

## 2017-05-18 ENCOUNTER — Other Ambulatory Visit: Payer: Self-pay | Admitting: "Endocrinology

## 2017-06-09 ENCOUNTER — Encounter: Payer: Self-pay | Admitting: Cardiovascular Disease

## 2017-06-15 ENCOUNTER — Other Ambulatory Visit: Payer: Self-pay | Admitting: "Endocrinology

## 2017-07-13 ENCOUNTER — Other Ambulatory Visit: Payer: Self-pay | Admitting: "Endocrinology

## 2017-08-12 ENCOUNTER — Encounter: Payer: Self-pay | Admitting: Cardiovascular Disease

## 2017-08-12 ENCOUNTER — Encounter: Payer: Self-pay | Admitting: *Deleted

## 2017-08-12 ENCOUNTER — Telehealth: Payer: Self-pay | Admitting: Cardiovascular Disease

## 2017-08-12 ENCOUNTER — Other Ambulatory Visit (INDEPENDENT_AMBULATORY_CARE_PROVIDER_SITE_OTHER): Payer: Medicaid Other | Admitting: *Deleted

## 2017-08-12 ENCOUNTER — Ambulatory Visit (INDEPENDENT_AMBULATORY_CARE_PROVIDER_SITE_OTHER): Payer: Medicaid Other | Admitting: Cardiovascular Disease

## 2017-08-12 VITALS — BP 124/92 | HR 90 | Ht 69.5 in | Wt 266.0 lb

## 2017-08-12 DIAGNOSIS — I10 Essential (primary) hypertension: Secondary | ICD-10-CM | POA: Diagnosis not present

## 2017-08-12 DIAGNOSIS — Z955 Presence of coronary angioplasty implant and graft: Secondary | ICD-10-CM | POA: Diagnosis not present

## 2017-08-12 DIAGNOSIS — R079 Chest pain, unspecified: Secondary | ICD-10-CM

## 2017-08-12 DIAGNOSIS — R0602 Shortness of breath: Secondary | ICD-10-CM | POA: Diagnosis not present

## 2017-08-12 DIAGNOSIS — I25118 Atherosclerotic heart disease of native coronary artery with other forms of angina pectoris: Secondary | ICD-10-CM | POA: Diagnosis not present

## 2017-08-12 DIAGNOSIS — E785 Hyperlipidemia, unspecified: Secondary | ICD-10-CM | POA: Diagnosis not present

## 2017-08-12 NOTE — Progress Notes (Signed)
SUBJECTIVE: The patient presents for past due follow-up.  I last evaluated her over 2 years ago in December 2016.  Her most recent cardiac catheterization was performed on 10/26/2012, which showed a patent RCA stent, 30% stenosis in the distal LAD, and 40% stenosis in the distal left circumflex coronary artery. Her RCA stent was placed on 09/18/2012.  She also has a history of hyperlipidemia, COPD, depression, fibromyalgia, GERD, hypertension, hypothyroidism, diabetes mellitus, obesity, and sleep apnea. She is a former smoker and quit in October 2012.  Echocardiogram on 09/19/14 demonstrated normal left ventricular systolic function, EF 19-14%, grade 1 diastolic dysfunction, and mild right ventricular enlargement.  She is here with her daughter.  She was apparently hospitalized for chest pain at [facility] the summer 2018.  I will have to request these records.  She has a hiatal hernia.  She has had difficulty sleeping.  She has had progressive chest pain and exertional dyspnea.  However symptoms can occur both with and without exertion and tend to remain constant.  She also has COPD and has not been using her medications as prescribed.  She said her diabetes has not been under good control.  At the most recent labs I have are dated 04/11/17 which demonstrated BUN 12, creatinine 0.98.  ECG performed today which I reviewed demonstrates sinus rhythm with nonspecific T wave abnormalities diffusely.    Review of Systems: As per "subjective", otherwise negative.  Allergies  Allergen Reactions  . Ciprofloxacin Itching and Nausea And Vomiting  . Sulfonamide Derivatives Hives  . Bee Venom Swelling and Rash  . Pravastatin Rash    "swelling in legs/arms, bumps all over"     Current Outpatient Medications  Medication Sig Dispense Refill  . albuterol (PROAIR HFA) 108 (90 BASE) MCG/ACT inhaler Inhale 2 puffs into the lungs every 6 (six) hours as needed for wheezing or shortness of breath.     Marland Kitchen aspirin EC 81 MG tablet Take 81 mg by mouth daily.    . cetirizine (ZYRTEC) 10 MG tablet Take 10 mg by mouth daily.    . DULoxetine (CYMBALTA) 30 MG capsule Take 30 mg by mouth daily.  0  . fesoterodine (TOVIAZ) 4 MG TB24 tablet Take 4 mg by mouth daily.    Marland Kitchen HYDROcodone-acetaminophen (NORCO) 7.5-325 MG per tablet Take 1 tablet by mouth every 6 (six) hours as needed for moderate pain or severe pain.     Marland Kitchen insulin aspart (NOVOLOG) 100 UNIT/ML injection Inject into the skin.    . INVOKANA 100 MG TABS tablet TAKE 1 TABLET BY MOUTH EVERY DAY 90 tablet 0  . isosorbide mononitrate (IMDUR) 30 MG 24 hr tablet Take 30 mg by mouth daily.  0  . levothyroxine (SYNTHROID, LEVOTHROID) 125 MCG tablet TAKE 1 TABLET BY MOUTH ONCE DAILY BEFORE BREAKFAST 30 tablet 2  . metFORMIN (GLUCOPHAGE) 1000 MG tablet TAKE 1 TABLET BY MOUTH TWICE DAILY 180 tablet 0  . metoCLOPramide (REGLAN) 10 MG tablet TAKE 1 TABLET (10 MG TOTAL) BY MOUTH 3 (THREE) TIMES DAILY BEFORE MEALS. 90 tablet 0  . metoprolol (LOPRESSOR) 50 MG tablet Take 50 mg by mouth 2 (two) times daily.    Marland Kitchen NEXIUM 40 MG capsule TAKE ONE CAPSULE BY MOUTH TWICE A DAY 60 capsule 0  . nitroGLYCERIN (NITROSTAT) 0.4 MG SL tablet Place 1 tablet (0.4 mg total) under the tongue every 5 (five) minutes as needed for chest pain. 25 tablet 3  . polyethylene glycol powder (GLYCOLAX/MIRALAX) powder  Take 17 g by mouth daily.  1  . pregabalin (LYRICA) 100 MG capsule Take 100 mg by mouth 3 (three) times daily.    . SUMAtriptan (IMITREX) 100 MG tablet TAKE 1 TABLET WITH FLUIDS AS SOON AS POSSIBLE AFTER ONSET OF MIGRAINE ATTACK/MAY REPEAT AFTER 2 HOUR  2  . tiotropium (SPIRIVA HANDIHALER) 18 MCG inhalation capsule Place 1 capsule (18 mcg total) into inhaler and inhale daily. 30 capsule 6  . topiramate (TOPAMAX) 100 MG tablet Take 100 mg by mouth 2 (two) times daily.  0  . ZETIA 10 MG tablet TAKE 1 TABLET BY MOUTH EVERY DAY. **NEED TO MAKE APPOINTMENT WITH MD FOR MORE REFILLS**  15 tablet 0   No current facility-administered medications for this visit.     Past Medical History:  Diagnosis Date  . Asthma   . Back pain, chronic   . COPD (chronic obstructive pulmonary disease) ([place])   . Coronary atherosclerosis of native coronary artery    Prior stent RCA - patent May 2014 with otherwise nonobstructive disease  . Epigastric abdominal pain 08/19/2010  . Essential hypertension, benign   . Fibromyalgia   . OCD (obsessive compulsive disorder)   . Rectal bleed 11/02/2010  . Sciatica   . Type 2 diabetes mellitus ([place])     Past Surgical History:  Procedure Laterality Date  . ABDOMINAL HYSTERECTOMY    . CHOLECYSTECTOMY    . COLONOSCOPY  11/30/2010  . COLONOSCOPY W/ BIOPSIES  12/24/2010   NUR  . COLONOSCOPY, ESOPHAGOGASTRODUODENOSCOPY (EGD) AND ESOPHAGEAL DILATION N/A 04/05/2013   Procedure: COLONOSCOPY, ESOPHAGOGASTRODUODENOSCOPY (EGD) AND ESOPHAGEAL DILATION;  Surgeon: Rogene Houston, MD;  [facility];  Service: Endoscopy;  Laterality: N/A;  . ESOPHAGEAL DILATION N/A 08/14/2014   Procedure: ESOPHAGEAL DILATION;  Surgeon: Rogene Houston, MD;  [facility];  Service: Endoscopy;  Laterality: N/A;  . ESOPHAGOGASTRODUODENOSCOPY  03/22/2012   Procedure: ESOPHAGOGASTRODUODENOSCOPY (EGD);  Surgeon: Rogene Houston, MD;  [facility];  Service: Endoscopy;  Laterality: N/A;  300  . ESOPHAGOGASTRODUODENOSCOPY N/A 08/14/2014   Procedure: ESOPHAGOGASTRODUODENOSCOPY (EGD);  Surgeon: Rogene Houston, MD;  [facility];  Service: Endoscopy;  Laterality: N/A;  225    Social History   Socioeconomic History  . Marital status: Divorced    Spouse name: Not on file  . Number of children: 3  . Years of education: GED  . Highest education level: Not on file  Social Needs  . Financial resource strain: Not on file  . Food insecurity - worry: Not on file  . Food insecurity - inability: Not on file  . Transportation needs - medical:  Not on file  . Transportation needs - non-medical: Not on file  Occupational History  . Not on file  Tobacco Use  . Smoking status: Former Smoker    Packs/day: 0.50    Years: 20.00    Pack years: 10.00    Types: Cigarettes    Start date: 08/16/1977    Last attempt to quit: 09/21/2011    Years since quitting: 5.8  . Smokeless tobacco: Never Used  . Tobacco comment: Quit 29yr ago after smoking 12 yrs  Substance and Sexual Activity  . Alcohol use: No    Alcohol/week: 0.0 oz    Comment: rare  . Drug use: No  . Sexual activity: Not on file  Other Topics Concern  . Not on file  Social History Narrative   Lives at home by herself   Her  daughter manages her medications as much as she can   Right handed   Drinks 1-2 cups caffeine daily        Vitals:   08/12/17 1104  BP: (!) 124/92  Pulse: 90  SpO2: 98%  Weight: 266 lb (120.7 kg)  Height: 5' 9.5" (1.765 m)    Wt Readings from Last 3 Encounters:  08/12/17 266 lb (120.7 kg)  04/11/17 260 lb 3.2 oz (118 kg)  09/02/16 262 lb (118.8 kg)     PHYSICAL EXAM General: NAD HEENT: Normal. Neck: No JVD, no thyromegaly. Lungs: Clear to auscultation bilaterally with normal respiratory effort. CV: Regular rate and rhythm, normal S1/S2, no S3/S4, no murmur. No pretibial or periankle edema.  No carotid bruit.   Abdomen: Soft, nontender, no distention.  Neurologic: Alert and oriented.  Psych: Normal affect. Skin: Normal. Musculoskeletal: No gross deformities.    ECG: Most recent ECG reviewed.   Labs: Lab Results  Component Value Date/Time   K 4.7 04/11/2017 02:28 PM   BUN 12 04/11/2017 02:28 PM   CREATININE 0.98 04/11/2017 02:28 PM   CREATININE 0.86 08/27/2016 10:59 AM   ALT 26 08/27/2016 10:59 AM   TSH 3.46 08/27/2016 10:59 AM   HGB 11.6 (L) 09/19/2014 06:58 AM     Lipids: Lab Results  Component Value Date/Time   LDLCALC 120 (H) 05/25/2016 03:21 PM   CHOL 199 05/25/2016 03:21 PM   TRIG 151 (H) 05/25/2016 03:21 PM    HDL 49 (L) 05/25/2016 03:21 PM       ASSESSMENT AND PLAN: 1. CAD with RCA stent with progressive chest pain and shortness of breath: Normal nuclear stress test in  9/15 and normal LV systolic function and regional wall motion by echo on 09/19/14. I will continue aspirin, Imdur, and metoprolol. She is not able to tolerate statin therapy reportedly due to liver dysfunction. I will obtain a copy of lipids from her PCP.  I will obtain records from [facility] regarding her hospitalization for chest pain in 2018.  I will obtain a Lexiscan Myoview stress test to evaluate for significant ischemic territories as well as an echocardiogram to evaluate cardiac structure and function.  2. Essential HTN: Diastolic blood pressure is mildly elevated.  No changes to therapy today.  I will monitor.  3. Hyperlipidemia: She is not able to tolerate statin therapy reportedly due to liver dysfunction. She had been on Zetia. Will check to see when most recent lipids were performed by PCP.   Disposition: Follow up the weeks   Kate Sable, M.D., F.A.C.C.

## 2017-08-12 NOTE — Patient Instructions (Signed)
Medication Instructions:  Continue all current medications.  Labwork: none  Testing/Procedures:  Your physician has requested that you have an echocardiogram. Echocardiography is a painless test that uses sound waves to create images of your heart. It provides your doctor with information about the size and shape of your heart and how well your heart's chambers and valves are working. This procedure takes approximately one hour. There are no restrictions for this procedure.  Your physician has requested that you have a lexiscan myoview. For further information please visit [email or web address]. Please follow instruction sheet, as given.  Office will contact with results via phone or letter.    Follow-Up: 3 weeks   Any Other Special Instructions Will Be Listed Below (If Applicable).  If you need a refill on your cardiac medications before your next appointment, please call your pharmacy. s

## 2017-08-12 NOTE — Telephone Encounter (Signed)
Pre-cert Verification for the following procedure   Echo scheduled for 08/25/17  lexiscan scheduled for 08-29-17 at [facility]

## 2017-08-25 ENCOUNTER — Ambulatory Visit (INDEPENDENT_AMBULATORY_CARE_PROVIDER_SITE_OTHER): Payer: Medicaid Other

## 2017-08-25 ENCOUNTER — Other Ambulatory Visit: Payer: Self-pay

## 2017-08-25 DIAGNOSIS — R0602 Shortness of breath: Secondary | ICD-10-CM

## 2017-08-25 DIAGNOSIS — R079 Chest pain, unspecified: Secondary | ICD-10-CM | POA: Diagnosis not present

## 2017-08-29 ENCOUNTER — Ambulatory Visit (HOSPITAL_COMMUNITY)
Admission: RE | Admit: 2017-08-29 | Discharge: 2017-08-29 | Disposition: A | Discharge status: Home / Self Care | Payer: Medicaid Other | Source: Ambulatory Visit | Attending: Cardiovascular Disease | Admitting: Cardiovascular Disease

## 2017-08-29 ENCOUNTER — Encounter (HOSPITAL_COMMUNITY): Payer: Self-pay

## 2017-08-29 ENCOUNTER — Ambulatory Visit (HOSPITAL_BASED_OUTPATIENT_CLINIC_OR_DEPARTMENT_OTHER)
Admission: RE | Admit: 2017-08-29 | Discharge: 2017-08-29 | Disposition: A | Discharge status: Home / Self Care | Payer: Medicaid Other | Source: Ambulatory Visit | Attending: Cardiovascular Disease | Admitting: Cardiovascular Disease

## 2017-08-29 DIAGNOSIS — R0602 Shortness of breath: Secondary | ICD-10-CM

## 2017-08-29 DIAGNOSIS — R079 Chest pain, unspecified: Secondary | ICD-10-CM

## 2017-08-29 LAB — NM MYOCAR MULTI W/SPECT W/WALL MOTION / EF
LV dias vol: 70 mL (ref 46–106)
LVSYSVOL: 18 mL
Peak HR: 104 {beats}/min
RATE: 0.42
Rest HR: 58 {beats}/min
SDS: 0
SRS: 2
SSS: 2
TID: 1.04

## 2017-08-29 MED ORDER — TECHNETIUM TC 99M TETROFOSMIN IV KIT
30.0000 | PACK | Freq: Once | INTRAVENOUS | Status: AC | PRN
  Administered 2017-08-29: 30.4 via INTRAVENOUS

## 2017-08-29 MED ORDER — SODIUM CHLORIDE 0.9% FLUSH
INTRAVENOUS | Status: AC
  Administered 2017-08-29: 10 mL via INTRAVENOUS
  Filled 2017-08-29: qty 10

## 2017-08-29 MED ORDER — REGADENOSON 0.4 MG/5ML IV SOLN
INTRAVENOUS | Status: AC
  Administered 2017-08-29: 0.4 mg via INTRAVENOUS
  Filled 2017-08-29: qty 5

## 2017-08-29 MED ORDER — TECHNETIUM TC 99M TETROFOSMIN IV KIT
10.0000 | PACK | Freq: Once | INTRAVENOUS | Status: AC | PRN
  Administered 2017-08-29: 10.5 via INTRAVENOUS

## 2017-09-01 ENCOUNTER — Telehealth: Payer: Self-pay | Admitting: *Deleted

## 2017-09-01 NOTE — Telephone Encounter (Signed)
Recent labs received from pmd.  Per Dr. Bronson Ing review - start Calio.    Patient notified and agrees to starting new medication.  She has been unable to tolerate statins in the past due to liver dysfunction.  Will notify pharmacist at [facility] office of new start and to assist with approval process.

## 2017-09-19 ENCOUNTER — Ambulatory Visit: Payer: Medicaid Other | Admitting: Cardiovascular Disease

## 2017-09-20 ENCOUNTER — Encounter: Payer: Self-pay | Admitting: Cardiovascular Disease

## 2017-10-07 ENCOUNTER — Other Ambulatory Visit: Payer: Self-pay | Admitting: *Deleted

## 2017-10-07 DIAGNOSIS — E785 Hyperlipidemia, unspecified: Secondary | ICD-10-CM

## 2017-10-10 ENCOUNTER — Ambulatory Visit: Payer: Medicaid Other | Admitting: Adult Health

## 2017-10-11 ENCOUNTER — Encounter: Payer: Self-pay | Admitting: Adult Health

## 2017-10-11 ENCOUNTER — Encounter (INDEPENDENT_AMBULATORY_CARE_PROVIDER_SITE_OTHER): Payer: Self-pay

## 2017-10-11 ENCOUNTER — Ambulatory Visit: Payer: Medicaid Other | Admitting: Adult Health

## 2017-10-11 VITALS — BP 128/80 | HR 67 | Ht 69.5 in | Wt 265.0 lb

## 2017-10-11 DIAGNOSIS — R413 Other amnesia: Secondary | ICD-10-CM | POA: Diagnosis not present

## 2017-10-11 NOTE — Progress Notes (Signed)
PATIENT: Amber Fischer DOB: 1961/01/07  REASON FOR VISIT: follow up HISTORY FROM: patient  HISTORY OF PRESENT ILLNESS: Today 10/11/17:  Amber Fischer is a 57 year old female with a history of memory disturbance.  She returns today for follow-up.  At the last visit MRI of the brain was ordered however the patient states that she was never called to schedule this.  A sleep referral was also placed.  She states that she had to cancel her appointment due to family emergency.  She feels that her memory fluctuates.  She lives at home alone but is currently staying with her parents as they have had health issues.  She is able to complete all ADLs independently.  She does operate a motor vehicle but reports that sometimes she forgets where she is going.  She states several months ago she was seeing spiders I and flowers on the wall.  She states that these have decreased in frequency and she only sees them occasionally now. Denies any change in medication  She does need assistance with her finances as she has trouble balancing her checkbook.  She does prepare meals but typically only does simple meals such as sandwiches.  She returns today for evaluation. HISTORY 04/11/17: Amber Fischer is a 57 y.o. female here as a referral from Dr. Sherrie Sport for mild cognitive impairment. PMHx DM2, sciatica, OCD, Fibromyalgia, HTN, CAD, COPD, back pain, asthma. Her family is worried. Symptoms started 18 months ago. She will get in the car and forget where she is going, she will pull off and sit for a while and then she remembers, she will go to the store and forget to bring it with her, she forgets to take her medicine she goes days without her medicine. She just completely forgets. She has had a lot of depression and trouble with boyfriend. She is having constipation, no bowel movements for weeks, lots of GI problems.This is "tearing me apart". She says she bleeds red out of her rectuma nd she follows with GI and has been  diagnosed with hemorrhoids, chronic severe pain in her rectum like an animal is eating its way, she has chronic back pain, her whole entire back, neck shoulders. She has depression but she feels her mood is better. She has insomnia, she will be awake at 6-7am in the morning. She has untreated sleep apnea.. Sleep study was n August 2015. She is fatigued during the day and falls asleep. She has confusion, she will stop in the middle of the raod and not know what she is doing. Grandmother with dementia in her 39s. Mother has no memory loss. Mother has had strokes.    Reviewed notes, labs and imaging from outside physicians, which showed:  Patient is divorced. Has 3 children. Her highest education is grammar scholl and she is disabled. Former smoker, quit 2 years ago. No alcohol. No exercise. No drugs. She has been having memory problems over 3 months. Exam normal. She did report mental disturbance.      REVIEW OF SYSTEMS: Out of a complete 14 system review of symptoms, the patient complains only of the following symptoms, and all other reviewed systems are negative.  Eye pain, shortness of breath, chest tightness, trouble swallowing, chest pain, palpitation, abdominal pain, rectal bleeding, constipation, rectal pain, restless leg, insomnia, apnea, snoring, joint pain, joint swelling, back pain, aching muscles, walking difficulty, neck pain, neck stiffness, depression, nervous/anxious, hyperactive, headache, weakness  ALLERGIES: Allergies  Allergen Reactions  . Ciprofloxacin Itching and  Nausea And Vomiting  . Sulfonamide Derivatives Hives  . Bee Venom Swelling and Rash  . Pravastatin Rash    "swelling in legs/arms, bumps all over"     HOME MEDICATIONS: Outpatient Medications Prior to Visit  Medication Sig Dispense Refill  . albuterol (PROAIR HFA) 108 (90 BASE) MCG/ACT inhaler Inhale 2 puffs into the lungs every 6 (six) hours as needed for wheezing or shortness of breath.    Marland Kitchen aspirin EC  81 MG tablet Take 81 mg by mouth daily.    . cetirizine (ZYRTEC) 10 MG tablet Take 10 mg by mouth daily.    . DULoxetine (CYMBALTA) 30 MG capsule Take 30 mg by mouth daily.  0  . esomeprazole (NEXIUM) 40 MG capsule Take 40 mg by mouth daily at 12 noon.    . fesoterodine (TOVIAZ) 4 MG TB24 tablet Take 4 mg by mouth daily.    Marland Kitchen HYDROcodone-acetaminophen (NORCO) 10-325 MG tablet Take 1 tablet by mouth 3 (three) times daily as needed.    . insulin aspart (NOVOLOG) 100 UNIT/ML injection Inject into the skin.    . INVOKANA 100 MG TABS tablet TAKE 1 TABLET BY MOUTH EVERY DAY 90 tablet 0  . isosorbide mononitrate (IMDUR) 30 MG 24 hr tablet Take 30 mg by mouth daily.  0  . levothyroxine (SYNTHROID, LEVOTHROID) 88 MCG tablet Take 88 mcg by mouth daily before breakfast.    . metFORMIN (GLUCOPHAGE) 1000 MG tablet TAKE 1 TABLET BY MOUTH TWICE DAILY 180 tablet 0  . metoCLOPramide (REGLAN) 5 MG tablet Take 5 mg by mouth 3 (three) times daily as needed for nausea.    . metoprolol (LOPRESSOR) 50 MG tablet Take 50 mg by mouth 2 (two) times daily.    . polyethylene glycol powder (GLYCOLAX/MIRALAX) powder Take 17 g by mouth daily.  1  . pregabalin (LYRICA) 100 MG capsule Take 100 mg by mouth 3 (three) times daily.    . SUMAtriptan (IMITREX) 100 MG tablet TAKE 1 TABLET WITH FLUIDS AS SOON AS POSSIBLE AFTER ONSET OF MIGRAINE ATTACK/MAY REPEAT AFTER 2 HOUR  2  . tiotropium (SPIRIVA HANDIHALER) 18 MCG inhalation capsule Place 1 capsule (18 mcg total) into inhaler and inhale daily. 30 capsule 6  . topiramate (TOPAMAX) 100 MG tablet Take 100 mg by mouth 2 (two) times daily.  0  . ZETIA 10 MG tablet TAKE 1 TABLET BY MOUTH EVERY DAY. **NEED TO MAKE APPOINTMENT WITH MD FOR MORE REFILLS** 15 tablet 0  . OMEPRAZOLE PO Take by mouth.    . nitroGLYCERIN (NITROSTAT) 0.4 MG SL tablet Place 1 tablet (0.4 mg total) under the tongue every 5 (five) minutes as needed for chest pain. (Patient not taking: Reported on 10/11/2017) 25 tablet  3  . HYDROcodone-acetaminophen (NORCO) 7.5-325 MG per tablet Take 1 tablet by mouth every 6 (six) hours as needed for moderate pain or severe pain.     Marland Kitchen levothyroxine (SYNTHROID, LEVOTHROID) 125 MCG tablet TAKE 1 TABLET BY MOUTH ONCE DAILY BEFORE BREAKFAST 30 tablet 2  . metoCLOPramide (REGLAN) 10 MG tablet TAKE 1 TABLET (10 MG TOTAL) BY MOUTH 3 (THREE) TIMES DAILY BEFORE MEALS. 90 tablet 0  . NEXIUM 40 MG capsule TAKE ONE CAPSULE BY MOUTH TWICE A DAY 60 capsule 0   No facility-administered medications prior to visit.     PAST MEDICAL HISTORY: Past Medical History:  Diagnosis Date  . Asthma   . Back pain, chronic   . COPD (chronic obstructive pulmonary disease) ([place])   .  Coronary atherosclerosis of native coronary artery    Prior stent RCA - patent May 2014 with otherwise nonobstructive disease  . Epigastric abdominal pain 08/19/2010  . Essential hypertension, benign   . Fibromyalgia   . OCD (obsessive compulsive disorder)   . Rectal bleed 11/02/2010  . Sciatica   . Type 2 diabetes mellitus ([place])     PAST SURGICAL HISTORY: Past Surgical History:  Procedure Laterality Date  . ABDOMINAL HYSTERECTOMY    . CHOLECYSTECTOMY    . COLONOSCOPY  11/30/2010  . COLONOSCOPY W/ BIOPSIES  12/24/2010   NUR  . COLONOSCOPY, ESOPHAGOGASTRODUODENOSCOPY (EGD) AND ESOPHAGEAL DILATION N/A 04/05/2013   Procedure: COLONOSCOPY, ESOPHAGOGASTRODUODENOSCOPY (EGD) AND ESOPHAGEAL DILATION;  Surgeon: Rogene Houston, MD;  [facility];  Service: Endoscopy;  Laterality: N/A;  . ESOPHAGEAL DILATION N/A 08/14/2014   Procedure: ESOPHAGEAL DILATION;  Surgeon: Rogene Houston, MD;  [facility];  Service: Endoscopy;  Laterality: N/A;  . ESOPHAGOGASTRODUODENOSCOPY  03/22/2012   Procedure: ESOPHAGOGASTRODUODENOSCOPY (EGD);  Surgeon: Rogene Houston, MD;  [facility];  Service: Endoscopy;  Laterality: N/A;  300  . ESOPHAGOGASTRODUODENOSCOPY N/A 08/14/2014   Procedure:  ESOPHAGOGASTRODUODENOSCOPY (EGD);  Surgeon: Rogene Houston, MD;  [facility];  Service: Endoscopy;  Laterality: N/A;  225    FAMILY HISTORY: Family History  Problem Relation Age of Onset  . Stroke Mother   . Memory loss Mother   . Fibromyalgia Mother   . Diabetes Father   . Kidney disease Father   . Heart attack Father   . Multiple sclerosis Sister   . Autoimmune disease Daughter   . Fibromyalgia Daughter   . Fibromyalgia Daughter   . Fibromyalgia Son   . Diabetes Son     SOCIAL HISTORY: Social History   Socioeconomic History  . Marital status: Divorced    Spouse name: Not on file  . Number of children: 3  . Years of education: GED  . Highest education level: Not on file  Occupational History  . Not on file  Social Needs  . Financial resource strain: Not on file  . Food insecurity:    Worry: Not on file    Inability: Not on file  . Transportation needs:    Medical: Not on file    Non-medical: Not on file  Tobacco Use  . Smoking status: Former Smoker    Packs/day: 0.50    Years: 20.00    Pack years: 10.00    Types: Cigarettes    Start date: 08/16/1977    Last attempt to quit: 09/21/2011    Years since quitting: 6.0  . Smokeless tobacco: Never Used  . Tobacco comment: Quit 51yr ago after smoking 12 yrs  Substance and Sexual Activity  . Alcohol use: Never    Alcohol/week: 0.0 oz    Frequency: Never  . Drug use: No  . Sexual activity: Not on file  Lifestyle  . Physical activity:    Days per week: Not on file    Minutes per session: Not on file  . Stress: Not on file  Relationships  . Social connections:    Talks on phone: Not on file    Gets together: Not on file    Attends religious service: Not on file    Active member of club or organization: Not on file    Attends meetings of clubs or organizations: Not on file    Relationship status: Not on file  . Intimate partner violence:  Fear of current or ex partner: Not on file    Emotionally  abused: Not on file    Physically abused: Not on file    Forced sexual activity: Not on file  Other Topics Concern  . Not on file  Social History Narrative   Lives at home by herself   Her daughter manages her medications as much as she can   Right handed   Drinks 1-2 cups caffeine daily         PHYSICAL EXAM  Vitals:   10/11/17 1319  BP: 128/80  Pulse: 67  Weight: 265 lb (120.2 kg)  Height: 5' 9.5" (1.765 m)   Body mass index is 38.57 kg/m.  Generalized: Well developed, in no acute distress   Neurological examination  Mentation: Alert oriented to time, place, history taking. Follows all commands speech and language fluent Cranial nerve II-XII: Pupils were equal round reactive to light. Extraocular movements were full, visual field were full on confrontational test. Facial sensation and strength were normal. Uvula tongue midline. Head turning and shoulder shrug  were normal and symmetric. Motor: The motor testing reveals 5 over 5 strength of all 4 extremities. Good symmetric motor tone is noted throughout.  Sensory: Sensory testing is intact to soft touch on all 4 extremities. No evidence of extinction is noted.  Coordination: Cerebellar testing reveals good finger-nose-finger and heel-to-shin bilaterally.  Gait and station: Gait is normal.  Reflexes: Deep tendon reflexes are symmetric and normal bilaterally.   DIAGNOSTIC DATA (LABS, IMAGING, TESTING) - I reviewed patient records, labs, notes, testing and imaging myself where available.  Lab Results  Component Value Date   WBC 5.1 09/19/2014   HGB 11.6 (L) 09/19/2014   HCT 35.5 (L) 09/19/2014   MCV 86.6 09/19/2014   PLT 148 (L) 09/19/2014      Component Value Date/Time   NA 143 04/11/2017 1428   K 4.7 04/11/2017 1428   CL 106 04/11/2017 1428   CO2 20 04/11/2017 1428   GLUCOSE 88 04/11/2017 1428   GLUCOSE 103 (H) 08/27/2016 1059   BUN 12 04/11/2017 1428   CREATININE 0.98 04/11/2017 1428   CREATININE 0.86  08/27/2016 1059   CALCIUM 9.6 04/11/2017 1428   PROT 6.9 08/27/2016 1059   ALBUMIN 4.1 08/27/2016 1059   AST 21 08/27/2016 1059   ALT 26 08/27/2016 1059   ALKPHOS 64 08/27/2016 1059   BILITOT 1.1 08/27/2016 1059   GFRNONAA 65 04/11/2017 1428   GFRNONAA 63 05/25/2016 1521   GFRAA 75 04/11/2017 1428   GFRAA 72 05/25/2016 1521   Lab Results  Component Value Date   CHOL 199 05/25/2016   HDL 49 (L) 05/25/2016   LDLCALC 120 (H) 05/25/2016   TRIG 151 (H) 05/25/2016   CHOLHDL 4.1 05/25/2016   Lab Results  Component Value Date   HGBA1C 6.1 (H) 08/27/2016   Lab Results  Component Value Date   VITAMINB12 295 04/11/2017   Lab Results  Component Value Date   TSH 3.46 08/27/2016      ASSESSMENT AND PLAN 57 y.o. year old female  has a past medical history of Asthma, Back pain, chronic, COPD (chronic obstructive pulmonary disease) ([place]), Coronary atherosclerosis of native coronary artery, Epigastric abdominal pain (08/19/2010), Essential hypertension, benign, Fibromyalgia, OCD (obsessive compulsive disorder), Rectal bleed (11/02/2010), Sciatica, and Type 2 diabetes mellitus ([place]). here with :  1.  Memory disturbance  The patient's memory score has declined slightly.  I advised that she should schedule MRI of the brain  and reschedule her sleep consult.  Pending these results we may consider adding on memory medication in the future.  She is advised that if her symptoms worsen or she develops new symptoms she should let us know.  She will follow-up in 3 to 4 months or sooner if needed.   I spent 15 minutes with the patient. 50% of this time was spent discussing her memory score   Ward Givens, MSN, NP-C 10/11/2017, 1:44 PM [facility] [address] [place], [place] [address] [phone]

## 2017-10-11 NOTE — Patient Instructions (Signed)
Your Plan:  Continue to monitor memory Will get you schedule for sleep consult MRI brain If your symptoms worsen or you develop new symptoms please let us know.   Thank you for coming to see [place] at [facility]. I hope we have been able to provide you high quality care today.  You may receive a patient satisfaction survey over the next few weeks. We would appreciate your feedback and comments so that we may continue to improve ourselves and the health of our patients.

## 2017-10-26 LAB — LIPID PANEL
CHOL/HDL RATIO: 4.7 (calc) (ref <5.0)
CHOLESTEROL: 268 mg/dL — AB (ref <200)
HDL: 57 mg/dL (ref >50)
LDL CHOLESTEROL (CALC): 181 mg/dL — AB
Non-HDL Cholesterol (Calc): 211 mg/dL (calc) — ABNORMAL HIGH (ref <130)
TRIGLYCERIDES: 158 mg/dL — AB (ref <150)

## 2017-10-28 NOTE — Progress Notes (Signed)
Personally  participated in, made any corrections needed, and agree with history, physical, neuro exam,assessment and plan as stated above.    Antonia Ahern, MD [facility] 

## 2017-11-02 ENCOUNTER — Telehealth: Payer: Self-pay | Admitting: *Deleted

## 2017-11-02 NOTE — Telephone Encounter (Signed)
Notes recorded by Laurine Blazer, LPN on 08/07/4713 at 9:53 AM EDT Patient notified. Will make pharm aware. Copy to pmd. ------  Notes recorded by Herminio Commons, MD on 10/27/2017 at 8:29 AM EDT Needs to start [place]. Very elevated lipids.

## 2017-11-07 ENCOUNTER — Telehealth: Payer: Self-pay | Admitting: Cardiovascular Disease

## 2017-11-07 NOTE — Telephone Encounter (Signed)
LM to return call.

## 2017-11-07 NOTE — Telephone Encounter (Signed)
Patient wants clarification on what shot she has been told that she has to start taking in her stomach

## 2017-11-07 NOTE — Telephone Encounter (Signed)
Returned call

## 2017-11-07 NOTE — Telephone Encounter (Addendum)
Patient notified that message has been sent to pharmacist about a week ago in regards to starting the [place].  Will follow up with her to check on status.

## 2017-11-17 ENCOUNTER — Ambulatory Visit: Payer: Medicaid Other | Admitting: Cardiovascular Disease

## 2017-11-17 ENCOUNTER — Encounter: Payer: Self-pay | Admitting: Cardiovascular Disease

## 2017-11-17 VITALS — BP 121/77 | HR 62 | Ht 69.5 in | Wt 265.0 lb

## 2017-11-17 DIAGNOSIS — E785 Hyperlipidemia, unspecified: Secondary | ICD-10-CM | POA: Diagnosis not present

## 2017-11-17 DIAGNOSIS — R0602 Shortness of breath: Secondary | ICD-10-CM | POA: Diagnosis not present

## 2017-11-17 DIAGNOSIS — I10 Essential (primary) hypertension: Secondary | ICD-10-CM

## 2017-11-17 DIAGNOSIS — Z955 Presence of coronary angioplasty implant and graft: Secondary | ICD-10-CM | POA: Diagnosis not present

## 2017-11-17 DIAGNOSIS — I25118 Atherosclerotic heart disease of native coronary artery with other forms of angina pectoris: Secondary | ICD-10-CM

## 2017-11-17 DIAGNOSIS — R079 Chest pain, unspecified: Secondary | ICD-10-CM | POA: Diagnosis not present

## 2017-11-17 MED ORDER — ISOSORBIDE MONONITRATE ER 60 MG PO TB24: 60.0000 mg | ORAL_TABLET | Freq: Every day | ORAL | 3 refills | Status: DC

## 2017-11-17 NOTE — Progress Notes (Signed)
SUBJECTIVE: The patient presents for routine follow-up.  Her most recent cardiac catheterization was performed on 10/26/2012, which showed a patent RCA stent, 30% stenosis in the distal LAD, and 40% stenosis in the distal left circumflex coronary artery. Her RCA stent was placed on 09/18/2012.   She also has a history of hyperlipidemia, COPD, depression, fibromyalgia, GERD, hypertension, hypothyroidism, diabetes mellitus, obesity, and sleep apnea. She is a former smoker and quit in October 2012.  Echocardiogram on 08/25/2017 showed normal left ventricular systolic and diastolic function with normal regional wall motion, LVEF 60 to 65%.  Her lipids are markedly elevated (10/26/2017: Total cholesterol 268, HDL 57, triglycerides 158, LDL 181) and I ordered Repatha.  Nuclear stress test on 08/29/2017 was low risk with no large ischemic territories noted.  She has been having some chest pains and abdominal pains.  She has not had use nitroglycerin.  The chest pain is retrosternally located.  She also has tender spots on her chest and abdominal wall.    Review of Systems: As per "subjective", otherwise negative.  Allergies  Allergen Reactions  . Ciprofloxacin Itching and Nausea And Vomiting  . Sulfonamide Derivatives Hives  . Bee Venom Swelling and Rash  . Pravastatin Rash    "swelling in legs/arms, bumps all over"     Current Outpatient Medications  Medication Sig Dispense Refill  . albuterol (PROAIR HFA) 108 (90 BASE) MCG/ACT inhaler Inhale 2 puffs into the lungs every 6 (six) hours as needed for wheezing or shortness of breath.    Marland Kitchen aspirin EC 81 MG tablet Take 81 mg by mouth daily.    . cetirizine (ZYRTEC) 10 MG tablet Take 10 mg by mouth daily.    . DULoxetine (CYMBALTA) 30 MG capsule Take 30 mg by mouth daily.  0  . esomeprazole (NEXIUM) 40 MG capsule Take 40 mg by mouth daily at 12 noon.    . fesoterodine (TOVIAZ) 4 MG TB24 tablet Take 4 mg by mouth daily.    Marland Kitchen  HYDROcodone-acetaminophen (NORCO) 10-325 MG tablet Take 1 tablet by mouth 3 (three) times daily as needed.    . insulin aspart (NOVOLOG) 100 UNIT/ML injection Inject into the skin.    . INVOKANA 100 MG TABS tablet TAKE 1 TABLET BY MOUTH EVERY DAY 90 tablet 0  . isosorbide mononitrate (IMDUR) 30 MG 24 hr tablet Take 30 mg by mouth daily.  0  . levothyroxine (SYNTHROID, LEVOTHROID) 88 MCG tablet Take 88 mcg by mouth daily before breakfast.    . metFORMIN (GLUCOPHAGE) 1000 MG tablet TAKE 1 TABLET BY MOUTH TWICE DAILY 180 tablet 0  . metoCLOPramide (REGLAN) 5 MG tablet Take 5 mg by mouth 3 (three) times daily as needed for nausea.    . metoprolol (LOPRESSOR) 50 MG tablet Take 50 mg by mouth 2 (two) times daily.    . nitroGLYCERIN (NITROSTAT) 0.4 MG SL tablet Place 1 tablet (0.4 mg total) under the tongue every 5 (five) minutes as needed for chest pain. 25 tablet 3  . polyethylene glycol powder (GLYCOLAX/MIRALAX) powder Take 17 g by mouth daily.  1  . pregabalin (LYRICA) 100 MG capsule Take 100 mg by mouth 3 (three) times daily.    . SUMAtriptan (IMITREX) 100 MG tablet TAKE 1 TABLET WITH FLUIDS AS SOON AS POSSIBLE AFTER ONSET OF MIGRAINE ATTACK/MAY REPEAT AFTER 2 HOUR  2  . tiotropium (SPIRIVA HANDIHALER) 18 MCG inhalation capsule Place 1 capsule (18 mcg total) into inhaler and inhale daily. 30 capsule  6  . topiramate (TOPAMAX) 100 MG tablet Take 100 mg by mouth 2 (two) times daily.  0  . ZETIA 10 MG tablet TAKE 1 TABLET BY MOUTH EVERY DAY. **NEED TO MAKE APPOINTMENT WITH MD FOR MORE REFILLS** 15 tablet 0   No current facility-administered medications for this visit.     Past Medical History:  Diagnosis Date  . Asthma   . Back pain, chronic   . COPD (chronic obstructive pulmonary disease) ([place])   . Coronary atherosclerosis of native coronary artery    Prior stent RCA - patent May 2014 with otherwise nonobstructive disease  . Epigastric abdominal pain 08/19/2010  . Essential hypertension, benign    . Fibromyalgia   . OCD (obsessive compulsive disorder)   . Rectal bleed 11/02/2010  . Sciatica   . Type 2 diabetes mellitus ([place])     Past Surgical History:  Procedure Laterality Date  . ABDOMINAL HYSTERECTOMY    . CHOLECYSTECTOMY    . COLONOSCOPY  11/30/2010  . COLONOSCOPY W/ BIOPSIES  12/24/2010   NUR  . COLONOSCOPY, ESOPHAGOGASTRODUODENOSCOPY (EGD) AND ESOPHAGEAL DILATION N/A 04/05/2013   Procedure: COLONOSCOPY, ESOPHAGOGASTRODUODENOSCOPY (EGD) AND ESOPHAGEAL DILATION;  Surgeon: Rogene Houston, MD;  [facility];  Service: Endoscopy;  Laterality: N/A;  . ESOPHAGEAL DILATION N/A 08/14/2014   Procedure: ESOPHAGEAL DILATION;  Surgeon: Rogene Houston, MD;  [facility];  Service: Endoscopy;  Laterality: N/A;  . ESOPHAGOGASTRODUODENOSCOPY  03/22/2012   Procedure: ESOPHAGOGASTRODUODENOSCOPY (EGD);  Surgeon: Rogene Houston, MD;  [facility];  Service: Endoscopy;  Laterality: N/A;  300  . ESOPHAGOGASTRODUODENOSCOPY N/A 08/14/2014   Procedure: ESOPHAGOGASTRODUODENOSCOPY (EGD);  Surgeon: Rogene Houston, MD;  [facility];  Service: Endoscopy;  Laterality: N/A;  225    Social History   Socioeconomic History  . Marital status: Divorced    Spouse name: Not on file  . Number of children: 3  . Years of education: GED  . Highest education level: Not on file  Occupational History  . Not on file  Social Needs  . Financial resource strain: Not on file  . Food insecurity:    Worry: Not on file    Inability: Not on file  . Transportation needs:    Medical: Not on file    Non-medical: Not on file  Tobacco Use  . Smoking status: Former Smoker    Packs/day: 0.50    Years: 20.00    Pack years: 10.00    Types: Cigarettes    Start date: 08/16/1977    Last attempt to quit: 09/21/2011    Years since quitting: 6.1  . Smokeless tobacco: Never Used  . Tobacco comment: Quit 51yr ago after smoking 12 yrs  Substance and Sexual Activity  . Alcohol  use: Never    Alcohol/week: 0.0 oz    Frequency: Never  . Drug use: No  . Sexual activity: Not on file  Lifestyle  . Physical activity:    Days per week: Not on file    Minutes per session: Not on file  . Stress: Not on file  Relationships  . Social connections:    Talks on phone: Not on file    Gets together: Not on file    Attends religious service: Not on file    Active member of club or organization: Not on file    Attends meetings of clubs or organizations: Not on file    Relationship status: Not on file  . Intimate partner  violence:    Fear of current or ex partner: Not on file    Emotionally abused: Not on file    Physically abused: Not on file    Forced sexual activity: Not on file  Other Topics Concern  . Not on file  Social History Narrative   Lives at home by herself   Her daughter manages her medications as much as she can   Right handed   Drinks 1-2 cups caffeine daily        Vitals:   11/17/17 1034  BP: 121/77  Pulse: 62  SpO2: 98%  Weight: 265 lb (120.2 kg)  Height: 5' 9.5" (1.765 m)    Wt Readings from Last 3 Encounters:  11/17/17 265 lb (120.2 kg)  10/11/17 265 lb (120.2 kg)  08/12/17 266 lb (120.7 kg)     PHYSICAL EXAM General: NAD HEENT: Normal. Neck: No JVD, no thyromegaly. Lungs: Clear to auscultation bilaterally with normal respiratory effort. CV: Regular rate and rhythm, normal S1/S2, no S3/S4, no murmur. No pretibial or periankle edema.     Abdomen: Soft, nontender, no distention.  Neurologic: Alert and oriented.  Psych: Normal affect. Skin: Normal. Musculoskeletal: No gross deformities.    ECG: Most recent ECG reviewed.   Labs: Lab Results  Component Value Date/Time   K 4.7 04/11/2017 02:28 PM   BUN 12 04/11/2017 02:28 PM   CREATININE 0.98 04/11/2017 02:28 PM   CREATININE 0.86 08/27/2016 10:59 AM   ALT 26 08/27/2016 10:59 AM   TSH 3.46 08/27/2016 10:59 AM   HGB 11.6 (L) 09/19/2014 06:58 AM     Lipids: Lab Results   Component Value Date/Time   LDLCALC 181 (H) 10/26/2017 09:16 AM   CHOL 268 (H) 10/26/2017 09:16 AM   TRIG 158 (H) 10/26/2017 09:16 AM   HDL 57 10/26/2017 09:16 AM       ASSESSMENT AND PLAN: 1. CAD with RCA stent with progressive chest pain and shortness of breath:  Nuclear stress test was low risk in March 2019.  I will increase Imdur to 60 mg daily. I will continue aspirin and metoprolol. She is not able to tolerate statin therapy reportedly due to liver dysfunction. Lipids reviewed above.  I have ordered Repatha.  2. Essential HTN: Blood pressure is normal today.  I will monitor given increase of Imdur to 60 mg.  3. Hyperlipidemia: She is not able to tolerate statin therapy reportedly due to liver dysfunction. She had been on Zetia.  Lipids reviewed above.  I ordered Repatha.     Disposition: Follow up 8 to 10 weeks   Kate Sable, M.D., F.A.C.C.

## 2017-11-17 NOTE — Patient Instructions (Addendum)
  Your physician recommends that you schedule a follow-up appointment in: 2 months with Dr.Koneswaran     INCREASE Imdur to 60 mg daily    All other medications stay the same    If you need a refill on your cardiac medications before your next appointment, please call your pharmacy.     No lab work or tests ordered today.     Thank you for choosing [facility] !

## 2017-12-02 ENCOUNTER — Telehealth: Payer: Self-pay | Admitting: Pharmacist Clinician (PhC)/ Clinical Pharmacy Specialist

## 2017-12-02 MED ORDER — EVOLOCUMAB 140 MG/ML ~~LOC~~ SOAJ: 140.0000 mg | SUBCUTANEOUS | 12 refills | Status: DC

## 2017-12-02 NOTE — Telephone Encounter (Signed)
repatha approved x 24 injections.  Rx sent to [facility]

## 2017-12-20 ENCOUNTER — Ambulatory Visit (INDEPENDENT_AMBULATORY_CARE_PROVIDER_SITE_OTHER): Payer: Medicaid Other | Admitting: Internal Medicine

## 2017-12-20 ENCOUNTER — Encounter (INDEPENDENT_AMBULATORY_CARE_PROVIDER_SITE_OTHER): Payer: Self-pay | Admitting: Internal Medicine

## 2017-12-20 VITALS — BP 122/88 | HR 64 | Temp 96.9°F | Resp 18 | Ht 69.5 in | Wt 262.6 lb

## 2017-12-20 DIAGNOSIS — R198 Other specified symptoms and signs involving the digestive system and abdomen: Secondary | ICD-10-CM

## 2017-12-20 DIAGNOSIS — S30817A Abrasion of anus, initial encounter: Secondary | ICD-10-CM | POA: Diagnosis not present

## 2017-12-20 DIAGNOSIS — K219 Gastro-esophageal reflux disease without esophagitis: Secondary | ICD-10-CM

## 2017-12-20 DIAGNOSIS — R634 Abnormal weight loss: Secondary | ICD-10-CM | POA: Diagnosis not present

## 2017-12-20 DIAGNOSIS — R1030 Lower abdominal pain, unspecified: Secondary | ICD-10-CM

## 2017-12-20 DIAGNOSIS — R1310 Dysphagia, unspecified: Secondary | ICD-10-CM

## 2017-12-20 DIAGNOSIS — R1319 Other dysphagia: Secondary | ICD-10-CM

## 2017-12-20 DIAGNOSIS — K3184 Gastroparesis: Secondary | ICD-10-CM | POA: Diagnosis not present

## 2017-12-20 DIAGNOSIS — K625 Hemorrhage of anus and rectum: Secondary | ICD-10-CM | POA: Diagnosis not present

## 2017-12-20 MED ORDER — NYSTATIN-TRIAMCINOLONE 100000-0.1 UNIT/GM-% EX CREA: 1.0000 "application " | TOPICAL_CREAM | Freq: Two times a day (BID) | CUTANEOUS | 1 refills | Status: DC

## 2017-12-20 MED ORDER — ESOMEPRAZOLE MAGNESIUM 40 MG PO CPDR: 40.0000 mg | DELAYED_RELEASE_CAPSULE | Freq: Every day | ORAL | 5 refills | Status: DC

## 2017-12-20 NOTE — Progress Notes (Signed)
Presenting complaint;  Patient has multiple complaints which include nausea vomiting dysphagia abdominal pain rectal bleeding rectal discharge and weight loss.  History of present illness.:  Patient is 57 year old Caucasian female with history of GERD and gastroparesis as well as history of dysphagia and chronic constipation who was last seen in March 2016 about a month after esophageal dilation.  She did not get any relief with dilation this time.  Barium pill study was scheduled but she did not proceed with it.  She complains of difficulty swallowing solids.  She points to the suprasternal/throat area.  She feels as if she has spasm.  At times she feels that somebody is had on her neck.  Food bolus eventually passes down.  She may occasionally have difficulty with liquids.  She says heartburn is well controlled with Nexium.  She has tried generic preparations but these do not work or she has vomiting.  She takes Nexium at noon. She has undergone esophageal dilation in October 2013, October 2014 and more recently in February 2016.  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  Patient also complains of postprandial nausea which occurs frequently if not daily.  She continues to have intermittent vomiting spells.  She vomited last evening after she ate a small steak.  She vomited one week earlier.  She denies hematemesis or melena.  She has a history of  gastroparesis.  She was on low-dose metoclopramide.  According to her daughter she is not taking this medication because she felt it was not helping.  She is supposed to be on gastroparesis diet but she is not doing so.  Patient also complains of pain across her lower abdomen.  This pain started about a year ago and she has it every day.  This pain may last from 1 to 2 hours.  This pain is not associated with nausea vomiting fever chills.  She denies hematuria or vaginal bleeding or discharge. She has a history  of constipation.  She states when she was at the beach few weeks ago she was having normal bowel movements.  Last bowel movement was normal 2 days ago. She also complains of rectal discharge.  She does not know if this discharge is clear or feculent.  She complains of perirectal pain and irritation.  She states she takes multiple baths every day. She also reports rectal bleeding every time she has a bowel movement.  It is fresh blood and she feels this more than small amount.  Last colonoscopy was in October 2014 revealing external hemorrhoids.  Her daughter is concerned that she has anal fissure or fistula but patient does not have any vaginal discharge.  She is not taking polyethylene glycol because she feels it makes her have nausea and vomiting but nausea and vomiting does not occur for at least one after she takes this medication.  Patient has lost 20 pounds since her last visit of March 2016.  Patient states that her weight got up to 300 pounds and then she lost 50 pounds last year even though she was not trying.  She has gained 30 pounds back.  Her daughter wonders if increasing Topamax dose was the cause of weight change.   Current Medications: Outpatient Encounter Medications as of 12/20/2017  Medication Sig  . albuterol (PROAIR HFA) 108 (90 BASE) MCG/ACT inhaler Inhale 2 puffs into the lungs every 6 (six) hours as needed for wheezing or shortness of breath.  Marland Kitchen  aspirin EC 81 MG tablet Take 81 mg by mouth daily.  Marland Kitchen esomeprazole (NEXIUM) 40 MG capsule Take 40 mg by mouth daily at 12 noon.  . Evolocumab (REPATHA SURECLICK) 962 MG/ML SOAJ Inject 140 mg into the skin every 14 (fourteen) days.  . fluconazole (DIFLUCAN) 150 MG tablet TAKE 1 TABLET(150MG ) BY ORAL ROUTE X1 DOSE  . HYDROcodone-acetaminophen (NORCO) 10-325 MG tablet Take 1 tablet by mouth 3 (three) times daily as needed.  . insulin aspart (NOVOLOG) 100 UNIT/ML injection Inject into the skin. Sliding Scale as needed  . INVOKANA 100 MG  TABS tablet TAKE 1 TABLET BY MOUTH EVERY DAY  . isosorbide mononitrate (IMDUR) 60 MG 24 hr tablet Take 1 tablet (60 mg total) by mouth daily.  Marland Kitchen levothyroxine (SYNTHROID, LEVOTHROID) 88 MCG tablet Take 88 mcg by mouth daily before breakfast.  . loratadine (CLARITIN) 10 MG tablet Take 10 mg by mouth daily.  . metFORMIN (GLUCOPHAGE) 1000 MG tablet TAKE 1 TABLET BY MOUTH TWICE DAILY  . methocarbamol (ROBAXIN) 500 MG tablet Take 500 mg by mouth every 8 (eight) hours as needed.  . metoprolol (LOPRESSOR) 50 MG tablet Take 50 mg by mouth 2 (two) times daily.  . nitroGLYCERIN (NITROSTAT) 0.4 MG SL tablet Place 1 tablet (0.4 mg total) under the tongue every 5 (five) minutes as needed for chest pain.  . pregabalin (LYRICA) 75 MG capsule Take 75 mg by mouth 2 (two) times daily.   . SUMAtriptan (IMITREX) 100 MG tablet TAKE 1 TABLET WITH FLUIDS AS SOON AS POSSIBLE AFTER ONSET OF MIGRAINE ATTACK/MAY REPEAT AFTER 2 HOUR  . tiotropium (SPIRIVA HANDIHALER) 18 MCG inhalation capsule Place 1 capsule (18 mcg total) into inhaler and inhale daily.  Marland Kitchen topiramate (TOPAMAX) 100 MG tablet Take 100 mg by mouth 2 (two) times daily.  . metoCLOPramide (REGLAN) 5 MG tablet Take 5 mg by mouth 3 (three) times daily as needed for nausea.  . polyethylene glycol powder (GLYCOLAX/MIRALAX) powder Take 17 g by mouth daily.  . [DISCONTINUED] cetirizine (ZYRTEC) 10 MG tablet Take 10 mg by mouth daily.  . [DISCONTINUED] DULoxetine (CYMBALTA) 30 MG capsule Take 30 mg by mouth daily.  . [DISCONTINUED] fesoterodine (TOVIAZ) 4 MG TB24 tablet Take 4 mg by mouth daily.  . [DISCONTINUED] ZETIA 10 MG tablet TAKE 1 TABLET BY MOUTH EVERY DAY. **NEED TO MAKE APPOINTMENT WITH MD FOR MORE REFILLS** (Patient not taking: Reported on 12/20/2017)   No facility-administered encounter medications on file as of 12/20/2017.    Past Medical History:  Diagnosis Date  . Asthma   . Back pain, chronic   . COPD (chronic obstructive pulmonary disease) ([place])   .  Coronary atherosclerosis of native coronary artery    Prior stent RCA - patent May 2014 with otherwise nonobstructive disease  .  Chronic GERD 08/19/2010  . Essential hypertension, benign   . Fibromyalgia   . OCD (obsessive compulsive disorder)   .  Chronic GERD 11/02/2010  . Sciatica   . Type 2 diabetes mellitus ([facility])        Chronic constipation.       Hyperlipidemia.  She was begun on Evolocumab 2 weeks ago.  Past Surgical History:  Procedure Laterality Date  . ABDOMINAL HYSTERECTOMY    . CHOLECYSTECTOMY    . COLONOSCOPY  11/30/2010  . COLONOSCOPY W/ BIOPSIES  12/24/2010   NUR  . COLONOSCOPY, ESOPHAGOGASTRODUODENOSCOPY (EGD) AND ESOPHAGEAL DILATION N/A 04/05/2013   Procedure: COLONOSCOPY, ESOPHAGOGASTRODUODENOSCOPY (EGD) AND ESOPHAGEAL DILATION;  Surgeon: Rogene Houston, MD;  [facility];  Service: Endoscopy;  Laterality: N/A;  . ESOPHAGEAL DILATION N/A 08/14/2014   Procedure: ESOPHAGEAL DILATION;  Surgeon: Rogene Houston, MD;  [facility];  Service: Endoscopy;  Laterality: N/A;  . ESOPHAGOGASTRODUODENOSCOPY  03/22/2012   Procedure: ESOPHAGOGASTRODUODENOSCOPY (EGD);  Surgeon: Rogene Houston, MD;  [facility];  Service: Endoscopy;  Laterality: N/A;  300  . ESOPHAGOGASTRODUODENOSCOPY N/A 08/14/2014   Procedure: ESOPHAGOGASTRODUODENOSCOPY (EGD);  Surgeon: Rogene Houston, MD;  [facility];  Service: Endoscopy;  Laterality: N/A;  225   Allergies: Allergies  Allergen Reactions  . Ciprofloxacin Itching and Nausea And Vomiting  . Sulfonamide Derivatives Hives  . Bee Venom Swelling and Rash  . Pravastatin Rash    "swelling in legs/arms, bumps all over"      Objective: Blood pressure 122/88, pulse 64, temperature (!) 96.9 F (36.1 C), temperature source Oral, resp. rate 18, height 5' 9.5" (1.765 m), weight 262 lb 9.6 oz (119.1 kg). Patient is alert and in no acute distress.   Conjunctiva is pink. Sclera is nonicteric Oropharyngeal  mucosa is normal. No neck masses or thyromegaly noted. Cardiac exam with regular rhythm normal S1 and S2. No murmur or gallop noted. Lungs are clear to auscultation. Abdomen is full.  She has lower midline scar.  Bowel sounds are normal.  No bruit noted.  She has mild to moderate tenderness in left lower quadrant and mild tenderness in right lower quadrant  and epigastrium.  No guarding rebound organomegaly or masses noted. Rectal examination reveals erythema to perianal skin with excoriation on the left side anteriorly.  Digital exam reveals no abnormality and stool is guaiac negative. No LE edema or clubbing noted.  Labs/studies Results:  Hemoglobin A1c was 6.3 on 06/09/2017  Assessment:  #1.  Lower abdominal pain.  This pain started 1 year ago and lately occurring every day associate with nausea.  Pain may be worse with meals but apparently no relief with bowel movement.  She does not have any vaginal symptoms.  She does have rectal bleeding which may be due to hemorrhoids but other etiologies remain in differential diagnosis.  Weight loss is very some.  Symptoms are not typical of intestinal ischemia or colitis.  Retroperitoneal fibrosis remains in differential diagnosis.  #2.  Rectal discharge.  She also has perianal skin irritation with excoriation resulting from discharge she does not have.  She could have proctitis which would explain rectal discharge and bleeding.  She will eventually need colonoscopy.  #3.  Esophageal dysphagia.  She has a history of esophageal valve but she did not respond to last dilation.  Therefore she may have esophageal motility disorder.  She will be further evaluated with barium pill esophagogram before considering manometry and impedance study.  #4.  GERD and gastroparesis.  She seems only to respond to Nexium.  Therefore prescription will be renewed.  She is having frequent nausea with sporadic vomiting.  She could have peptic ulcer disease or worsening  gastroparesis.  Etiology would appear to be diabetes and medications.  Patient is also noncompliant with her diet.   Plan:  New prescription given for Nexium 40 mg 30 minutes before breakfast daily. CBC and comprehensive chemistry panel.   Mycolog-II cream to be applied to perianal area twice daily for 2 weeks and thereafter on as-needed basis. Proceed with abdominal pelvic CT with contrast. Gastroparesis diet.. Written instructions provided. Patient will keep symptom diary as to frequency of vomiting spells rectal discharge and  bleeding. She may also need EGD and colonoscopy depending on findings of CT and lab studies. Office visit in 1 month.

## 2017-12-20 NOTE — Patient Instructions (Addendum)
Abdominal pelvic CT and barium pill esophagogram to be scheduled. Patient will call with results of blood test when completed. Gastroparesis diet. Remember  to take Nexium at least 30 minutes before breakfast. Keep symptom diary as to frequency of vomiting spells rectal discharge and rectal bleeding. Use Mycolog-II cream  twice daily for 2 weeks and thereafter on as-needed basis.

## 2017-12-21 LAB — COMPREHENSIVE METABOLIC PANEL
AG Ratio: 1.6 (calc) (ref 1.0–2.5)
ALT: 58 U/L — ABNORMAL HIGH (ref 6–29)
AST: 45 U/L — ABNORMAL HIGH (ref 10–35)
Albumin: 4.2 g/dL (ref 3.6–5.1)
Alkaline phosphatase (APISO): 87 U/L (ref 33–130)
BILIRUBIN TOTAL: 0.8 mg/dL (ref 0.2–1.2)
BUN: 20 mg/dL (ref 7–25)
CALCIUM: 9.4 mg/dL (ref 8.6–10.4)
CO2: 21 mmol/L (ref 20–32)
CREATININE: 0.82 mg/dL (ref 0.50–1.05)
Chloride: 108 mmol/L (ref 98–110)
Globulin: 2.7 g/dL (calc) (ref 1.9–3.7)
Glucose, Bld: 189 mg/dL — ABNORMAL HIGH (ref 65–139)
Potassium: 3.8 mmol/L (ref 3.5–5.3)
SODIUM: 139 mmol/L (ref 135–146)
TOTAL PROTEIN: 6.9 g/dL (ref 6.1–8.1)

## 2017-12-21 LAB — CBC
HEMATOCRIT: 41.6 % (ref 35.0–45.0)
Hemoglobin: 14 g/dL (ref 11.7–15.5)
MCH: 29.5 pg (ref 27.0–33.0)
MCHC: 33.7 g/dL (ref 32.0–36.0)
MCV: 87.8 fL (ref 80.0–100.0)
MPV: 12.6 fL — AB (ref 7.5–12.5)
PLATELETS: 179 10*3/uL (ref 140–400)
RBC: 4.74 10*6/uL (ref 3.80–5.10)
RDW: 12.5 % (ref 11.0–15.0)
WBC: 5.3 10*3/uL (ref 3.8–10.8)

## 2017-12-26 ENCOUNTER — Encounter: Payer: Self-pay | Admitting: "Endocrinology

## 2017-12-27 ENCOUNTER — Ambulatory Visit (HOSPITAL_COMMUNITY)
Admission: RE | Admit: 2017-12-27 | Discharge: 2017-12-27 | Disposition: A | Discharge status: Home / Self Care | Payer: Medicaid Other | Source: Ambulatory Visit | Attending: Internal Medicine | Admitting: Internal Medicine

## 2017-12-27 DIAGNOSIS — Z9049 Acquired absence of other specified parts of digestive tract: Secondary | ICD-10-CM | POA: Insufficient documentation

## 2017-12-27 DIAGNOSIS — R932 Abnormal findings on diagnostic imaging of liver and biliary tract: Secondary | ICD-10-CM | POA: Insufficient documentation

## 2017-12-27 DIAGNOSIS — R1030 Lower abdominal pain, unspecified: Secondary | ICD-10-CM | POA: Insufficient documentation

## 2017-12-27 DIAGNOSIS — R634 Abnormal weight loss: Secondary | ICD-10-CM | POA: Diagnosis not present

## 2017-12-27 MED ORDER — IOPAMIDOL (ISOVUE-300) INJECTION 61%
100.0000 mL | Freq: Once | INTRAVENOUS | Status: AC | PRN
  Administered 2017-12-27: 100 mL via INTRAVENOUS

## 2018-01-03 ENCOUNTER — Telehealth (INDEPENDENT_AMBULATORY_CARE_PROVIDER_SITE_OTHER): Payer: Self-pay | Admitting: *Deleted

## 2018-01-03 ENCOUNTER — Encounter (INDEPENDENT_AMBULATORY_CARE_PROVIDER_SITE_OTHER): Payer: Self-pay | Admitting: *Deleted

## 2018-01-03 ENCOUNTER — Other Ambulatory Visit (INDEPENDENT_AMBULATORY_CARE_PROVIDER_SITE_OTHER): Payer: Self-pay | Admitting: Internal Medicine

## 2018-01-03 DIAGNOSIS — K625 Hemorrhage of anus and rectum: Secondary | ICD-10-CM

## 2018-01-03 DIAGNOSIS — R1115 Cyclical vomiting syndrome unrelated to migraine: Secondary | ICD-10-CM

## 2018-01-03 DIAGNOSIS — R634 Abnormal weight loss: Secondary | ICD-10-CM | POA: Insufficient documentation

## 2018-01-03 DIAGNOSIS — R109 Unspecified abdominal pain: Secondary | ICD-10-CM

## 2018-01-03 DIAGNOSIS — R1310 Dysphagia, unspecified: Secondary | ICD-10-CM

## 2018-01-03 MED ORDER — PEG 3350-KCL-NA BICARB-NACL 420 G PO SOLR: 4000.0000 mL | Freq: Once | ORAL | 0 refills | Status: AC

## 2018-01-03 NOTE — Telephone Encounter (Signed)
Patient needs trilyte 

## 2018-01-09 ENCOUNTER — Other Ambulatory Visit (HOSPITAL_COMMUNITY): Payer: Medicaid Other

## 2018-01-09 NOTE — Patient Instructions (Signed)
Amber Fischer  01/09/2018     @PREFPERIOPPHARMACY @   Your procedure is scheduled on  01/20/2018   Report to [facility] at  1100   A.M.  Call this number if you have problems the morning of surgery:  [phone]   Remember:  Do not eat or drink after midnight.  You may drink clear liquids until ( follow the diet and prep instructions given to you by Dr Olevia Perches office)  .  Clear liquids allowed are:                    Water, Juice (non-citric and without pulp), Carbonated beverages, Clear Tea, Black Coffee only, Plain Jell-O only, Gatorade and Plain Popsicles only    Take these medicines the morning of surgery with A SIP OF WATER  Nexium, hydrocodone, imdur, levothyroxine, claritin, metoprolol, lyrica, topamax. Use your inhalers before you come.    Do not wear jewelry, make-up or nail polish.  Do not wear lotions, powders, or perfumes, or deodorant.  Do not shave 48 hours prior to surgery.  Men may shave face and neck.  Do not bring valuables to the hospital.  [facility] is not responsible for any belongings or valuables.  Contacts, dentures or bridgework may not be worn into surgery.  Leave your suitcase in the car.  After surgery it may be brought to your room.  For patients admitted to the hospital, discharge time will be determined by your treatment team.  Patients discharged the day of surgery will not be allowed to drive home.   Name and phone number of your driver:   family Special instructions:  Follow the diet and prep instructions given to you by Dr Olevia Perches office.  Please read over the following fact sheets that you were given. Anesthesia Post-op Instructions and Care and Recovery After Surgery       Esophagogastroduodenoscopy Esophagogastroduodenoscopy (EGD) is a procedure to examine the lining of the esophagus, stomach, and first part of the small intestine (duodenum). This procedure is done to check for problems such as inflammation,  bleeding, ulcers, or growths. During this procedure, a long, flexible, lighted tube with a camera attached (endoscope) is inserted down the throat. Tell a health care provider about:  Any allergies you have.  All medicines you are taking, including vitamins, herbs, eye drops, creams, and over-the-counter medicines.  Any problems you or family members have had with anesthetic medicines.  Any blood disorders you have.  Any surgeries you have had.  Any medical conditions you have.  Whether you are pregnant or may be pregnant. What are the risks? Generally, this is a safe procedure. However, problems may occur, including:  Infection.  Bleeding.  A tear (perforation) in the esophagus, stomach, or duodenum.  Trouble breathing.  Excessive sweating.  Spasms of the larynx.  A slowed heartbeat.  Low blood pressure.  What happens before the procedure?  Follow instructions from your health care provider about eating or drinking restrictions.  Ask your health care provider about: ? Changing or stopping your regular medicines. This is especially important if you are taking diabetes medicines or blood thinners. ? Taking medicines such as aspirin and ibuprofen. These medicines can thin your blood. Do not take these medicines before your procedure if your health care provider instructs you not to.  Plan to have someone take you home after the procedure.  If you wear  dentures, be ready to remove them before the procedure. What happens during the procedure?  To reduce your risk of infection, your health care team will wash or sanitize their hands.  An IV tube will be put in a vein in your hand or arm. You will get medicines and fluids through this tube.  You will be given one or more of the following: ? A medicine to help you relax (sedative). ? A medicine to numb the area (local anesthetic). This medicine may be sprayed into your throat. It will make you feel more comfortable and  keep you from gagging or coughing during the procedure. ? A medicine for pain.  A mouth guard may be placed in your mouth to protect your teeth and to keep you from biting on the endoscope.  You will be asked to lie on your left side.  The endoscope will be lowered down your throat into your esophagus, stomach, and duodenum.  Air will be put into the endoscope. This will help your health care provider see better.  The lining of your esophagus, stomach, and duodenum will be examined.  Your health care provider may: ? Take a tissue sample so it can be looked at in a lab (biopsy). ? Remove growths. ? Remove objects (foreign bodies) that are stuck. ? Treat any bleeding with medicines or other devices that stop tissue from bleeding. ? Widen (dilate) or stretch narrowed areas of your esophagus and stomach.  The endoscope will be taken out. The procedure may vary among health care providers and hospitals. What happens after the procedure?  Your blood pressure, heart rate, breathing rate, and blood oxygen level will be monitored often until the medicines you were given have worn off.  Do not eat or drink anything until the numbing medicine has worn off and your gag reflex has returned. This information is not intended to replace advice given to you by your health care provider. Make sure you discuss any questions you have with your health care provider. Document Released: 10/08/2004 Document Revised: 11/13/2015 Document Reviewed: 05/01/2015 Elsevier Interactive Patient Education  2018 [employer]. Esophagogastroduodenoscopy, Care After Refer to this sheet in the next few weeks. These instructions provide you with information about caring for yourself after your procedure. Your health care provider may also give you more specific instructions. Your treatment has been planned according to current medical practices, but problems sometimes occur. Call your health care provider if you have any  problems or questions after your procedure. What can I expect after the procedure? After the procedure, it is common to have:  A sore throat.  Nausea.  Bloating.  Dizziness.  Fatigue.  Follow these instructions at home:  Do not eat or drink anything until the numbing medicine (local anesthetic) has worn off and your gag reflex has returned. You will know that the local anesthetic has worn off when you can swallow comfortably.  Do not drive for 24 hours if you received a medicine to help you relax (sedative).  If your health care provider took a tissue sample for testing during the procedure, make sure to get your test results. This is your responsibility. Ask your health care provider or the department performing the test when your results will be ready.  Keep all follow-up visits as told by your health care provider. This is important. Contact a health care provider if:  You cannot stop coughing.  You are not urinating.  You are urinating less than usual. Get help right  away if:  You have trouble swallowing.  You cannot eat or drink.  You have throat or chest pain that gets worse.  You are dizzy or light-headed.  You faint.  You have nausea or vomiting.  You have chills.  You have a fever.  You have severe abdominal pain.  You have black, tarry, or bloody stools. This information is not intended to replace advice given to you by your health care provider. Make sure you discuss any questions you have with your health care provider. Document Released: 05/24/2012 Document Revised: 11/13/2015 Document Reviewed: 05/01/2015 Elsevier Interactive Patient Education  2018 [employer].  Colonoscopy, Adult A colonoscopy is an exam to look at the large intestine. It is done to check for problems, such as:  Lumps (tumors).  Growths (polyps).  Swelling (inflammation).  Bleeding.  What happens before the procedure? Eating and drinking Follow instructions from  your doctor about eating and drinking. These instructions may include:  A few days before the procedure - follow a low-fiber diet. ? Avoid nuts. ? Avoid seeds. ? Avoid dried fruit. ? Avoid raw fruits. ? Avoid vegetables.  1-3 days before the procedure - follow a clear liquid diet. Avoid liquids that have red or purple dye. Drink only clear liquids, such as: ? Clear broth or bouillon. ? Black coffee or tea. ? Clear juice. ? Clear soft drinks or sports drinks. ? Gelatin dessert. ? Popsicles.  On the day of the procedure - do not eat or drink anything during the 2 hours before the procedure.  Bowel prep If you were prescribed an oral bowel prep:  Take it as told by your doctor. Starting the day before your procedure, you will need to drink a lot of liquid. The liquid will cause you to poop (have bowel movements) until your poop is almost clear or light green.  If your skin or butt gets irritated from diarrhea, you may: ? Wipe the area with wipes that have medicine in them, such as adult wet wipes with aloe and vitamin E. ? Put something on your skin that soothes the area, such as petroleum jelly.  If you throw up (vomit) while drinking the bowel prep, take a break for up to 60 minutes. Then begin the bowel prep again. If you keep throwing up and you cannot take the bowel prep without throwing up, call your doctor.  General instructions  Ask your doctor about changing or stopping your normal medicines. This is important if you take diabetes medicines or blood thinners.  Plan to have someone take you home from the hospital or clinic. What happens during the procedure?  An IV tube may be put into one of your veins.  You will be given medicine to help you relax (sedative).  To reduce your risk of infection: ? Your doctors will wash their hands. ? Your anal area will be washed with soap.  You will be asked to lie on your side with your knees bent.  Your doctor will get a long,  thin, flexible tube ready. The tube will have a camera and a light on the end.  The tube will be put into your anus.  The tube will be gently put into your large intestine.  Air will be delivered into your large intestine to keep it open. You may feel some pressure or cramping.  The camera will be used to take photos.  A small tissue sample may be removed from your body to be looked at under  a microscope (biopsy). If any possible problems are found, the tissue will be sent to a lab for testing.  If small growths are found, your doctor may remove them and have them checked for cancer.  The tube that was put into your anus will be slowly removed. The procedure may vary among doctors and hospitals. What happens after the procedure?  Your doctor will check on you often until the medicines you were given have worn off.  Do not drive for 24 hours after the procedure.  You may have a small amount of blood in your poop.  You may pass gas.  You may have mild cramps or bloating in your belly (abdomen).  It is up to you to get the results of your procedure. Ask your doctor, or the department performing the procedure, when your results will be ready. This information is not intended to replace advice given to you by your health care provider. Make sure you discuss any questions you have with your health care provider. Document Released: 07/10/2010 Document Revised: 04/07/2016 Document Reviewed: 08/19/2015 Elsevier Interactive Patient Education  2017 [employer]  Colonoscopy, Adult, Care After This sheet gives you information about how to care for yourself after your procedure. Your health care provider may also give you more specific instructions. If you have problems or questions, contact your health care provider. What can I expect after the procedure? After the procedure, it is common to have:  A small amount of blood in your stool for 24 hours after the procedure.  Some  gas.  Mild abdominal cramping or bloating.  Follow these instructions at home: General instructions   For the first 24 hours after the procedure: ? Do not drive or use machinery. ? Do not sign important documents. ? Do not drink alcohol. ? Do your regular daily activities at a slower pace than normal. ? Eat soft, easy-to-digest foods. ? Rest often.  Take over-the-counter or prescription medicines only as told by your health care provider.  It is up to you to get the results of your procedure. Ask your health care provider, or the department performing the procedure, when your results will be ready. Relieving cramping and bloating  Try walking around when you have cramps or feel bloated.  Apply heat to your abdomen as told by your health care provider. Use a heat source that your health care provider recommends, such as a moist heat pack or a heating pad. ? Place a towel between your skin and the heat source. ? Leave the heat on for 20-30 minutes. ? Remove the heat if your skin turns bright red. This is especially important if you are unable to feel pain, heat, or cold. You may have a greater risk of getting burned. Eating and drinking  Drink enough fluid to keep your urine clear or pale yellow.  Resume your normal diet as instructed by your health care provider. Avoid heavy or fried foods that are hard to digest.  Avoid drinking alcohol for as long as instructed by your health care provider. Contact a health care provider if:  You have blood in your stool 2-3 days after the procedure. Get help right away if:  You have more than a small spotting of blood in your stool.  You pass large blood clots in your stool.  Your abdomen is swollen.  You have nausea or vomiting.  You have a fever.  You have increasing abdominal pain that is not relieved with medicine. This information is  not intended to replace advice given to you by your health care provider. Make sure you  discuss any questions you have with your health care provider. Document Released: 01/20/2004 Document Revised: 03/01/2016 Document Reviewed: 08/19/2015 Elsevier Interactive Patient Education  2018 [employer].  Esophageal Dilatation Esophageal dilatation is a procedure to open a blocked or narrowed part of the esophagus. The esophagus is the long tube in your throat that carries food and liquid from your mouth to your stomach. The procedure is also called esophageal dilation. You may need this procedure if you have a buildup of scar tissue in your esophagus that makes it difficult, painful, or even impossible to swallow. This can be caused by gastroesophageal reflux disease (GERD). In rare cases, people need this procedure because they have cancer of the esophagus or a problem with the way food moves through the esophagus. Sometimes you may need to have another dilatation to enlarge the opening of the esophagus gradually. Tell a health care provider about:  Any allergies you have.  All medicines you are taking, including vitamins, herbs, eye drops, creams, and over-the-counter medicines.  Any problems you or family members have had with anesthetic medicines.  Any blood disorders you have.  Any surgeries you have had.  Any medical conditions you have.  Any antibiotic medicines you are required to take before dental procedures. What are the risks? Generally, this is a safe procedure. However, problems can occur and include:  Bleeding from a tear in the lining of the esophagus.  A hole (perforation) in the esophagus.  What happens before the procedure?  Do not eat or drink anything after midnight on the night before the procedure or as directed by your health care provider.  Ask your health care provider about changing or stopping your regular medicines. This is especially important if you are taking diabetes medicines or blood thinners.  Plan to have someone take you home after the  procedure. What happens during the procedure?  You will be given a medicine that makes you relaxed and sleepy (sedative).  A medicine may be sprayed or gargled to numb the back of the throat.  Your health care provider can use various instruments to do an esophageal dilatation. During the procedure, the instrument used will be placed in your mouth and passed down into your esophagus. Options include: ? Simple dilators. This instrument is carefully placed in the esophagus to stretch it. ? Guided wire bougies. In this method, a flexible tube (endoscope) is used to insert a wire into the esophagus. The dilator is passed over this wire to enlarge the esophagus. Then the wire is removed. ? Balloon dilators. An endoscope with a small balloon at the end is passed down into the esophagus. Inflating the balloon gently stretches the esophagus and opens it up. What happens after the procedure?  Your blood pressure, heart rate, breathing rate, and blood oxygen level will be monitored often until the medicines you were given have worn off.  Your throat may feel slightly sore and will probably still feel numb. This will improve slowly over time.  You will not be allowed to eat or drink until the throat numbness has resolved.  If this is a same-day procedure, you may be allowed to go home once you have been able to drink, urinate, and sit on the edge of the bed without nausea or dizziness.  If this is a same-day procedure, you should have a friend or family member with you for the next  24 hours after the procedure. This information is not intended to replace advice given to you by your health care provider. Make sure you discuss any questions you have with your health care provider. Document Released: 07/29/2005 Document Revised: 11/13/2015 Document Reviewed: 10/17/2013 Elsevier Interactive Patient Education  2018 [employer] Anesthesia is a term that refers to techniques,  procedures, and medicines that help a person stay safe and comfortable during a medical procedure. Monitored anesthesia care, or sedation, is one type of anesthesia. Your anesthesia specialist may recommend sedation if you will be having a procedure that does not require you to be unconscious, such as:  Cataract surgery.  A dental procedure.  A biopsy.  A colonoscopy.  During the procedure, you may receive a medicine to help you relax (sedative). There are three levels of sedation:  Mild sedation. At this level, you may feel awake and relaxed. You will be able to follow directions.  Moderate sedation. At this level, you will be sleepy. You may not remember the procedure.  Deep sedation. At this level, you will be asleep. You will not remember the procedure.  The more medicine you are given, the deeper your level of sedation will be. Depending on how you respond to the procedure, the anesthesia specialist may change your level of sedation or the type of anesthesia to fit your needs. An anesthesia specialist will monitor you closely during the procedure. Let your health care provider know about:  Any allergies you have.  All medicines you are taking, including vitamins, herbs, eye drops, creams, and over-the-counter medicines.  Any use of steroids (by mouth or as a cream).  Any problems you or family members have had with sedatives and anesthetic medicines.  Any blood disorders you have.  Any surgeries you have had.  Any medical conditions you have, such as sleep apnea.  Whether you are pregnant or may be pregnant.  Any use of cigarettes, alcohol, or street drugs. What are the risks? Generally, this is a safe procedure. However, problems may occur, including:  Getting too much medicine (oversedation).  Nausea.  Allergic reaction to medicines.  Trouble breathing. If this happens, a breathing tube may be used to help with breathing. It will be removed when you are awake and  breathing on your own.  Heart trouble.  Lung trouble.  Before the procedure Staying hydrated Follow instructions from your health care provider about hydration, which may include:  Up to 2 hours before the procedure - you may continue to drink clear liquids, such as water, clear fruit juice, black coffee, and plain tea.  Eating and drinking restrictions Follow instructions from your health care provider about eating and drinking, which may include:  8 hours before the procedure - stop eating heavy meals or foods such as meat, fried foods, or fatty foods.  6 hours before the procedure - stop eating light meals or foods, such as toast or cereal.  6 hours before the procedure - stop drinking milk or drinks that contain milk.  2 hours before the procedure - stop drinking clear liquids.  Medicines Ask your health care provider about:  Changing or stopping your regular medicines. This is especially important if you are taking diabetes medicines or blood thinners.  Taking medicines such as aspirin and ibuprofen. These medicines can thin your blood. Do not take these medicines before your procedure if your health care provider instructs you not to.  Tests and exams  You will have a  physical exam.  You may have blood tests done to show: ? How well your kidneys and liver are working. ? How well your blood can clot.  General instructions  Plan to have someone take you home from the hospital or clinic.  If you will be going home right after the procedure, plan to have someone with you for 24 hours.  What happens during the procedure?  Your blood pressure, heart rate, breathing, level of pain and overall condition will be monitored.  An IV tube will be inserted into one of your veins.  Your anesthesia specialist will give you medicines as needed to keep you comfortable during the procedure. This may mean changing the level of sedation.  The procedure will be performed. After  the procedure  Your blood pressure, heart rate, breathing rate, and blood oxygen level will be monitored until the medicines you were given have worn off.  Do not drive for 24 hours if you received a sedative.  You may: ? Feel sleepy, clumsy, or nauseous. ? Feel forgetful about what happened after the procedure. ? Have a sore throat if you had a breathing tube during the procedure. ? Vomit. This information is not intended to replace advice given to you by your health care provider. Make sure you discuss any questions you have with your health care provider. Document Released: 03/03/2005 Document Revised: 11/14/2015 Document Reviewed: 09/28/2015 Elsevier Interactive Patient Education  2018 [place], Care After These instructions provide you with information about caring for yourself after your procedure. Your health care provider may also give you more specific instructions. Your treatment has been planned according to current medical practices, but problems sometimes occur. Call your health care provider if you have any problems or questions after your procedure. What can I expect after the procedure? After your procedure, it is common to:  Feel sleepy for several hours.  Feel clumsy and have poor balance for several hours.  Feel forgetful about what happened after the procedure.  Have poor judgment for several hours.  Feel nauseous or vomit.  Have a sore throat if you had a breathing tube during the procedure.  Follow these instructions at home: For at least 24 hours after the procedure:   Do not: ? Participate in activities in which you could fall or become injured. ? Drive. ? Use heavy machinery. ? Drink alcohol. ? Take sleeping pills or medicines that cause drowsiness. ? Make important decisions or sign legal documents. ? Take care of children on your own.  Rest. Eating and drinking  Follow the diet that is recommended by your health  care provider.  If you vomit, drink water, juice, or soup when you can drink without vomiting.  Make sure you have little or no nausea before eating solid foods. General instructions  Have a responsible adult stay with you until you are awake and alert.  Take over-the-counter and prescription medicines only as told by your health care provider.  If you smoke, do not smoke without supervision.  Keep all follow-up visits as told by your health care provider. This is important. Contact a health care provider if:  You keep feeling nauseous or you keep vomiting.  You feel light-headed.  You develop a rash.  You have a fever. Get help right away if:  You have trouble breathing. This information is not intended to replace advice given to you by your health care provider. Make sure you discuss any questions you have with your  health care provider. Document Released: 09/28/2015 Document Revised: 01/28/2016 Document Reviewed: 09/28/2015 Elsevier Interactive Patient Education  [employer].

## 2018-01-11 ENCOUNTER — Other Ambulatory Visit: Payer: Self-pay

## 2018-01-11 ENCOUNTER — Encounter (HOSPITAL_COMMUNITY): Payer: Self-pay | Admitting: Emergency Medicine

## 2018-01-11 ENCOUNTER — Encounter (HOSPITAL_COMMUNITY): Payer: Self-pay

## 2018-01-11 ENCOUNTER — Emergency Department (HOSPITAL_COMMUNITY): Payer: Medicaid Other

## 2018-01-11 ENCOUNTER — Emergency Department (HOSPITAL_COMMUNITY)
Admission: EM | Admit: 2018-01-11 | Discharge: 2018-01-11 | Disposition: A | Discharge status: Home / Self Care | Payer: Medicaid Other | Attending: Emergency Medicine | Admitting: Emergency Medicine

## 2018-01-11 ENCOUNTER — Telehealth (INDEPENDENT_AMBULATORY_CARE_PROVIDER_SITE_OTHER): Payer: Self-pay | Admitting: *Deleted

## 2018-01-11 ENCOUNTER — Encounter (HOSPITAL_COMMUNITY)
Admission: RE | Admit: 2018-01-11 | Discharge: 2018-01-11 | Disposition: A | Discharge status: Home / Self Care | Payer: Medicaid Other | Source: Ambulatory Visit | Attending: Internal Medicine | Admitting: Internal Medicine

## 2018-01-11 DIAGNOSIS — I10 Essential (primary) hypertension: Secondary | ICD-10-CM | POA: Diagnosis not present

## 2018-01-11 DIAGNOSIS — Z794 Long term (current) use of insulin: Secondary | ICD-10-CM | POA: Insufficient documentation

## 2018-01-11 DIAGNOSIS — R634 Abnormal weight loss: Secondary | ICD-10-CM

## 2018-01-11 DIAGNOSIS — Z87891 Personal history of nicotine dependence: Secondary | ICD-10-CM | POA: Diagnosis not present

## 2018-01-11 DIAGNOSIS — E119 Type 2 diabetes mellitus without complications: Secondary | ICD-10-CM | POA: Diagnosis not present

## 2018-01-11 DIAGNOSIS — R0789 Other chest pain: Secondary | ICD-10-CM | POA: Insufficient documentation

## 2018-01-11 DIAGNOSIS — R1115 Cyclical vomiting syndrome unrelated to migraine: Secondary | ICD-10-CM

## 2018-01-11 DIAGNOSIS — R079 Chest pain, unspecified: Secondary | ICD-10-CM

## 2018-01-11 DIAGNOSIS — J449 Chronic obstructive pulmonary disease, unspecified: Secondary | ICD-10-CM | POA: Insufficient documentation

## 2018-01-11 DIAGNOSIS — R1310 Dysphagia, unspecified: Secondary | ICD-10-CM

## 2018-01-11 DIAGNOSIS — Z7982 Long term (current) use of aspirin: Secondary | ICD-10-CM | POA: Insufficient documentation

## 2018-01-11 DIAGNOSIS — Z79899 Other long term (current) drug therapy: Secondary | ICD-10-CM | POA: Diagnosis not present

## 2018-01-11 DIAGNOSIS — R109 Unspecified abdominal pain: Secondary | ICD-10-CM

## 2018-01-11 DIAGNOSIS — K625 Hemorrhage of anus and rectum: Secondary | ICD-10-CM

## 2018-01-11 HISTORY — DX: Unspecified cirrhosis of liver: K74.60

## 2018-01-11 LAB — HEPATIC FUNCTION PANEL
ALT: 57 U/L — ABNORMAL HIGH (ref 0–44)
AST: 50 U/L — ABNORMAL HIGH (ref 15–41)
Albumin: 4.2 g/dL (ref 3.5–5.0)
Alkaline Phosphatase: 84 U/L (ref 38–126)
BILIRUBIN INDIRECT: 0.9 mg/dL (ref 0.3–0.9)
BILIRUBIN TOTAL: 1 mg/dL (ref 0.3–1.2)
Bilirubin, Direct: 0.1 mg/dL (ref 0.0–0.2)
Total Protein: 7.8 g/dL (ref 6.5–8.1)

## 2018-01-11 LAB — BASIC METABOLIC PANEL
Anion gap: 11 (ref 5–15)
BUN: 19 mg/dL (ref 6–20)
CHLORIDE: 104 mmol/L (ref 98–111)
CO2: 23 mmol/L (ref 22–32)
Calcium: 9.6 mg/dL (ref 8.9–10.3)
Creatinine, Ser: 0.87 mg/dL (ref 0.44–1.00)
GFR calc Af Amer: >60 mL/min (ref >60)
GFR calc non Af Amer: >60 mL/min (ref >60)
GLUCOSE: 125 mg/dL — AB (ref 70–99)
POTASSIUM: 4.1 mmol/L (ref 3.5–5.1)
Sodium: 138 mmol/L (ref 135–145)

## 2018-01-11 LAB — CBC
HEMATOCRIT: 44.6 % (ref 36.0–46.0)
HEMOGLOBIN: 15.1 g/dL — AB (ref 12.0–15.0)
MCH: 30.6 pg (ref 26.0–34.0)
MCHC: 33.9 g/dL (ref 30.0–36.0)
MCV: 90.5 fL (ref 78.0–100.0)
Platelets: 203 10*3/uL (ref 150–400)
RBC: 4.93 MIL/uL (ref 3.87–5.11)
RDW: 13 % (ref 11.5–15.5)
WBC: 7.9 10*3/uL (ref 4.0–10.5)

## 2018-01-11 LAB — TROPONIN I
Troponin I: <0.03 ng/mL (ref <0.03)
Troponin I: <0.03 ng/mL (ref <0.03)

## 2018-01-11 LAB — LIPASE, BLOOD: Lipase: 29 U/L (ref 11–51)

## 2018-01-11 LAB — D-DIMER, QUANTITATIVE (NOT AT ARMC): D DIMER QUANT: 0.53 ug{FEU}/mL — AB (ref 0.00–0.50)

## 2018-01-11 MED ORDER — ESOMEPRAZOLE MAGNESIUM 40 MG PO CPDR: 40.0000 mg | DELAYED_RELEASE_CAPSULE | Freq: Every day | ORAL | 0 refills | Status: DC

## 2018-01-11 MED ORDER — ONDANSETRON 4 MG PO TBDP: 4.0000 mg | ORAL_TABLET | Freq: Three times a day (TID) | ORAL | 0 refills | Status: DC | PRN

## 2018-01-11 MED ORDER — ASPIRIN 81 MG PO CHEW
324.0000 mg | CHEWABLE_TABLET | Freq: Once | ORAL | Status: AC
Start: 2018-01-11 — End: 2018-01-11
  Administered 2018-01-11: 324 mg via ORAL
  Filled 2018-01-11: qty 4

## 2018-01-11 MED ORDER — GI COCKTAIL ~~LOC~~
30.0000 mL | Freq: Once | ORAL | Status: AC
  Administered 2018-01-11: 30 mL via ORAL
  Filled 2018-01-11: qty 30

## 2018-01-11 MED ORDER — SODIUM CHLORIDE 0.9 % IV BOLUS
1000.0000 mL | Freq: Once | INTRAVENOUS | Status: AC
  Administered 2018-01-11: 1000 mL via INTRAVENOUS

## 2018-01-11 NOTE — ED Notes (Signed)
Called to room, pt states she has heaviness to left arm and "pulling" on her neck.  Reported that pt had some diaphoresis about 10 min ago but skin warm and dry at present.

## 2018-01-11 NOTE — Telephone Encounter (Signed)
Patient's daughter called and states that the patient has been experiencing really bad abdominal/Chest pain last night and today. The pain is also in the patient's left arm.  Daughter states that she has had this before and gone to [facility] where they do heart studies and it is always normal. She also states that her mother has recently been diagnosed with Cirrhosis of the Liver on a CT scan ordered by Dr.Rehman. Both the patient and the daughter are questioning if her symptoms are from this.  They are coming to [facility] /ED for evaluation. Dr. Laural Golden to be made aware.  He will contact the ED to discuss patient with ED Physician.

## 2018-01-11 NOTE — Discharge Instructions (Addendum)
If your chest or abdominal pain worsens or you develop any other new/concerning symptoms then return to the ER for evaluation.  Otherwise follow-up with your primary care physician.

## 2018-01-11 NOTE — ED Notes (Signed)
Received report on pt, comfort measures provided, update given, pt c/o abd and chest pain with nausea, requesting a coke,

## 2018-01-11 NOTE — ED Notes (Signed)
ED Provider at bedside. 

## 2018-01-11 NOTE — ED Provider Notes (Signed)
[facility] EMERGENCY DEPARTMENT Provider Note   CSN: [id number] Arrival date & time: 01/11/18  1610     History   Chief Complaint Chief Complaint  Patient presents with  . Chest Pain    HPI Amber Fischer is a 57 y.o. female.  HPI  57 year old female with a history of coronary disease with a stent, type 2 diabetes, and new diagnosis of cirrhosis presents with chest pain and abdominal pain.  The patient was referred in by her gastroenterologist.  The patient's been having some on and off chest pain, shortness of breath, left arm pain and abdominal pain since last night.  This is a recurrent issue for the patient is states has been on for years.  She typically goes to the hospital in [place] but states they never find anything wrong and she is discharged home to follow-up with her cardiologist.  Last had a cath in 2014 with nonobstructive disease and a patent stent.  She states this pain feels similar to those other pains.  It is a heavy chest pain but also sharp and stinging.  There is concomitant upper abdominal pain as well.  Some back pain and left shoulder pain.  Nothing specific makes it better or worse.  Past Medical History:  Diagnosis Date  . Asthma   . Back pain, chronic   . Cirrhosis of liver ([place])   . COPD (chronic obstructive pulmonary disease) ([place])   . Coronary atherosclerosis of native coronary artery    Prior stent RCA - patent May 2014 with otherwise nonobstructive disease  . Epigastric abdominal pain 08/19/2010  . Essential hypertension, benign   . Fibromyalgia   . OCD (obsessive compulsive disorder)   . Rectal bleed 11/02/2010  . Sciatica   . Type 2 diabetes mellitus [facility])     Patient Active Problem List   Diagnosis Date Noted  . Non-intractable cyclical vomiting with nausea 01/03/2018  . Dysphagia 01/03/2018  . Abdominal pain 01/03/2018  . Rectal bleeding 01/03/2018  . Loss of weight 01/03/2018  . OSA (obstructive sleep apnea) 04/12/2017  . Type 2  diabetes mellitus, uncontrolled ([place]) 11/27/2015  . Other specified hypothyroidism 11/27/2015  . Chest pain, atypical 09/18/2014  . Pre-syncope 09/18/2014  . Hypotension 09/18/2014  . Dysphagia, pharyngoesophageal phase 08/01/2014  . Precordial pain 03/19/2014  . Coronary atherosclerosis of native coronary artery 09/20/2013  . GERD (gastroesophageal reflux disease) 03/13/2013  . Gastroparesis 05/23/2012  . Hyperlipidemia 03/27/2009  . TOBACCO ABUSE 03/27/2009  . Essential hypertension, benign 03/27/2009    Past Surgical History:  Procedure Laterality Date  . ABDOMINAL HYSTERECTOMY    . CHOLECYSTECTOMY    . COLONOSCOPY  11/30/2010  . COLONOSCOPY W/ BIOPSIES  12/24/2010   NUR  . COLONOSCOPY, ESOPHAGOGASTRODUODENOSCOPY (EGD) AND ESOPHAGEAL DILATION N/A 04/05/2013   Procedure: COLONOSCOPY, ESOPHAGOGASTRODUODENOSCOPY (EGD) AND ESOPHAGEAL DILATION;  Surgeon: Rogene Houston, MD;  Location[facility] END[facility];  Service: Endoscopy;  Laterality: N/A;  . ESOPHAGEAL DILATION N/A 08/14/2014   Procedure: ESOPHAGEAL DILATION;  Surgeon: Rogene Houston, MD;  Location: [facility] ENDO SUITE;  Service: Endoscopy;  Laterality: N/A;  . ESOPHAGOGASTRODUODENOSCOPY  03/22/2012   Procedure: ESOPHAGOGASTRODUODENOSCOPY (EGD);  Surgeon: Rogene Houston, MD;  Location: [facility] ENDO SUITE;  Service: Endoscopy;  Laterality: N/A;  300  . ESOPHAGOGASTRODUODENOSCOPY N/A 08/14/2014   Procedure: ESOPHAGOGASTRODUODENOSCOPY (EGD);  Surgeon: Rogene Houston, MD;  Location: [facility] ENDO SU[facility];  Service: Endoscopy;  Laterality: N/A;  225     OB History   None  Home Medications    Prior to Admission medications   Medication Sig Start Date End Date Taking? Authorizing Provider  albuterol (PROAIR HFA) 108 (90 BASE) MCG/ACT inhaler Inhale 2 puffs into the lungs every 6 (six) hours as needed for wheezing or shortness of breath.   Yes [provider]  aspirin EC 81 MG tablet Take 81 mg by mouth every morning.    Yes [provider]  Biotin 300 MCG TABS Take 300 mcg by mouth every morning.    Yes [provider]  Evolocumab (REPATHA SURECLICK) 644 MG/ML SOAJ Inject 140 mg into the skin every 14 (fourteen) days. 12/02/17  Yes Herminio Commons, MD  fluconazole (DIFLUCAN) 150 MG tablet TAKE 1 TABLET(150MG ) BY ORAL ROUTE X1 DOSE 12/02/17  Yes [provider]  HYDROcodone-acetaminophen (NORCO/VICODIN) 5-325 MG tablet Take 1 tablet by mouth 3 (three) times daily as needed for moderate pain.   Yes [provider]  insulin aspart (NOVOLOG) 100 UNIT/ML injection Inject 3-6 Units into the skin 3 (three) times daily as needed for high blood sugar. Inject 3 units if blood sugar is 150 -200, inject 6 units if over 200   Yes [provider]  INVOKANA 100 MG TABS tablet TAKE 1 TABLET BY MOUTH EVERY DAY Patient taking differently: TAKE 100MG  BY MOUTH ONCE DAILY IN THE MORNING 12/20/16  Yes Nida, Marella Chimes, MD  isosorbide mononitrate (IMDUR) 30 MG 24 hr tablet Take 30 mg by mouth daily.   Yes [provider]  levothyroxine (SYNTHROID, LEVOTHROID) 88 MCG tablet Take 88 mcg by mouth daily before breakfast.   Yes [provider]  loratadine (CLARITIN) 10 MG tablet Take 10 mg by mouth daily.   Yes [provider]  metFORMIN (GLUCOPHAGE) 500 MG tablet Take 500 mg by mouth 2 (two) times daily with a meal.   Yes [provider]  methocarbamol (ROBAXIN) 500 MG tablet Take 500 mg by mouth every 8 (eight) hours as needed for muscle spasms.  11/15/17  Yes [provider]  metoprolol (LOPRESSOR) 50 MG tablet Take 50 mg by mouth 2 (two) times daily.   Yes [provider]  nitroGLYCERIN (NITROSTAT) 0.4 MG SL tablet Place 1 tablet (0.4 mg total) under the tongue every 5 (five) minutes as needed for chest pain. 05/22/15  Yes Herminio Commons, MD  nystatin-triamcinolone (MYCOLOG II) cream Apply 1 application topically 2 (two) times daily. 12/20/17  Yes  Rehman, Mechele Dawley, MD  polyethylene glycol powder (GLYCOLAX/MIRALAX) powder Take 17 g by mouth daily as needed for moderate constipation.  07/26/14  Yes [provider]  pregabalin (LYRICA) 75 MG capsule Take 75 mg by mouth at bedtime.    Yes [provider]  tiotropium (SPIRIVA HANDIHALER) 18 MCG inhalation capsule Place 1 capsule (18 mcg total) into inhaler and inhale daily. Patient taking differently: Place 18 mcg into inhaler and inhale daily as needed (for shortness of breath).  07/09/14  Yes Herminio Commons, MD  topiramate (TOPAMAX) 100 MG tablet Take 100 mg by mouth 2 (two) times daily.  03/09/16  Yes [provider]  triamcinolone cream (KENALOG) 0.1 % APPLY TO AFFECTED AREA TWICE DAILY FOR 1 WEEK THEN AS NEEDED 01/05/18  Yes [provider]  esomeprazole (NEXIUM) 40 MG capsule Take 1 capsule (40 mg total) by mouth daily at 12 noon. 01/11/18   Sherwood Gambler, MD  isosorbide mononitrate (IMDUR) 60 MG 24 hr tablet Take 1 tablet (60 mg total) by mouth daily.  Patient not taking: Reported on 01/05/2018 11/17/17   Herminio Commons, MD  ondansetron (ZOFRAN ODT) 4 MG disintegrating tablet Take 1 tablet (4 mg total) by mouth every 8 (eight) hours as needed. 01/11/18   Sherwood Gambler, MD    Family History Family History  Problem Relation Age of Onset  . Stroke Mother   . Memory loss Mother   . Fibromyalgia Mother   . Diabetes Father   . Kidney disease Father   . Heart attack Father   . Multiple sclerosis Sister   . Autoimmune disease Daughter   . Fibromyalgia Daughter   . Fibromyalgia Daughter   . Fibromyalgia Son   . Diabetes Son     Social History Social History   Tobacco Use  . Smoking status: Former Smoker    Packs/day: 0.50    Years: 20.00    Pack years: 10.00    Types: Cigarettes    Start date: 08/16/1977    Last attempt to quit: 09/21/2011    Years since quitting: 6.3  . Smokeless tobacco: Never Used  . Tobacco comment: Quit 30yr ago  after smoking 12 yrs  Substance Use Topics  . Alcohol use: Never    Alcohol/week: 0.0 oz    Frequency: Never  . Drug use: No     Allergies   Ciprofloxacin; Sulfonamide derivatives; Bee venom; and Pravastatin   Review of Systems Review of Systems  Constitutional: Negative for fever.  Respiratory: Positive for shortness of breath.   Cardiovascular: Positive for chest pain.  Gastrointestinal: Positive for abdominal pain and nausea. Negative for vomiting.  Musculoskeletal: Positive for back pain.  All other systems reviewed and are negative.    Physical Exam Updated Vital Signs BP 102/63   Pulse 64   Temp 97.7 F (36.5 C) (Oral)   Resp 19   Ht 5' 9.5" (1.765 m)   Wt 120.2 kg (265 lb)   SpO2 99%   BMI 38.57 kg/m   Physical Exam  Constitutional: She is oriented to person, place, and time. She appears well-developed and well-nourished.  Non-toxic appearance. She does not appear ill. No distress.  HENT:  Head: Normocephalic and atraumatic.  Right Ear: External ear normal.  Left Ear: External ear normal.  Nose: Nose normal.  Eyes: Right eye exhibits no discharge. Left eye exhibits no discharge.  Cardiovascular: Normal rate, regular rhythm and normal heart sounds.  Pulmonary/Chest: Effort normal and breath sounds normal. She exhibits no tenderness.  Abdominal: Soft. There is tenderness (mild) in the right upper quadrant, epigastric area and left upper quadrant.  Neurological: She is alert and oriented to person, place, and time.  Skin: Skin is warm and dry.  Nursing note and vitals reviewed.    ED Treatments / Results  Labs (all labs ordered are listed, but only abnormal results are displayed) Labs Reviewed  BASIC METABOLIC PANEL - Abnormal; Notable for the following components:      Result Value   Glucose, Bld 125 (*)    All other components within normal limits  CBC - Abnormal; Notable for the following components:   Hemoglobin 15.1 (*)    All other components  within normal limits  HEPATIC FUNCTION PANEL - Abnormal; Notable for the following components:   AST 50 (*)    ALT 57 (*)    All other components within normal limits  D-DIMER, QUANTITATIVE (NOT AT [facility]) - Abnormal; Notable for the following components:   D-Dimer, Quant 0.53 (*)    All other components  within normal limits  TROPONIN I  LIPASE, BLOOD  TROPONIN I  TROPONIN I    EKG EKG Interpretation  Date/Time:  Wednesday January 11 2018 16:13:55 EDT Ventricular Rate:  73 PR Interval:  184 QRS Duration: 82 QT Interval:  392 QTC Calculation: 431 R Axis:   69 Text Interpretation:  Normal sinus rhythm no acute ST/T changes no significant change since 2016 Confirmed by Sherwood Gambler [phone]) on 01/11/2018 5:22:24 PM   Radiology Dg Chest 2 View  Result Date: 01/11/2018 CLINICAL DATA:  Left-sided chest pain radiating to neck and left arm. Associated shortness of breath and upper abdominal pain. EXAM: CHEST - 2 VIEW COMPARISON:  12/02/2017 FINDINGS: The heart size and mediastinal contours are within normal limits. There may be a mild component of underlying emphysematous lung disease. There is no evidence of pulmonary edema, consolidation, pneumothorax, nodule or pleural fluid. The visualized skeletal structures are unremarkable. IMPRESSION: No active disease.  Mild emphysema suspected. Electronically Signed   By: Aletta Edouard M.D.   On: 01/11/2018 17:20    Procedures Procedures (including critical care time)  Medications Ordered in ED Medications  aspirin chewable tablet 324 mg (324 mg Oral Given 01/11/18 1652)  gi cocktail (Maalox,Lidocaine,Donnatal) (30 mLs Oral Given 01/11/18 1652)  sodium chloride 0.9 % bolus 1,000 mL (0 mLs Intravenous Stopped 01/11/18 2155)     Initial Impression / Assessment and Plan / ED Course  I have reviewed the triage vital signs and the nursing notes.  Pertinent labs & imaging results that were available during my care of the patient were reviewed by  me and considered in my medical decision making (see chart for details).     Patient's chest/abdominal pain is quite atypical.  Patient and family are concerned because they have never found an answer to why she has this but I do not know the answer would be found tonight.  Her vital signs are unremarkable.  Troponins are negative.  Accidentally had a troponin drawn earlier than needed but all 3 have been negative.  While she does have coronary disease, most recent cath showed nonobstructive disease and patent stent and this is atypical enough and has been going on long enough that I do not think an emergent work-up is needed.  She notes a family history of blood clots but has no other signs or symptoms of blood clots.  Age-adjusted d-dimer is negative.  I discussed GI symptomatology and she would like to retry Nexium but states only the brand name capsule work.  She is on the powder that is not helping and she vomits.  I will also give her Zofran in case her side effects recur.  Otherwise I think she can have an outpatient work-up.  Return precautions.  Final Clinical Impressions(s) / ED Diagnoses   Final diagnoses:  Nonspecific chest pain    ED Discharge Orders        Ordered    esomeprazole (NEXIUM) 40 MG capsule  Daily    Note to Pharmacy:  Brand name only please   01/11/18 1833    ondansetron (ZOFRAN ODT) 4 MG disintegrating tablet  Every 8 hours PRN     01/11/18 2332       Sherwood Gambler, MD 01/12/18 0020

## 2018-01-11 NOTE — ED Notes (Signed)
Assisted pt to [facility]

## 2018-01-11 NOTE — ED Notes (Signed)
EKG done and seen by Dr Eulis Foster

## 2018-01-20 ENCOUNTER — Encounter (HOSPITAL_COMMUNITY): Payer: Self-pay | Admitting: *Deleted

## 2018-01-20 ENCOUNTER — Ambulatory Visit: Payer: Medicaid Other | Admitting: Cardiovascular Disease

## 2018-01-20 ENCOUNTER — Ambulatory Visit (HOSPITAL_COMMUNITY): Payer: Medicaid Other | Admitting: Anesthesiology

## 2018-01-20 ENCOUNTER — Ambulatory Visit (HOSPITAL_COMMUNITY)
Admission: RE | Admit: 2018-01-20 | Discharge: 2018-01-20 | Disposition: A | Discharge status: Home / Self Care | Payer: Medicaid Other | Source: Ambulatory Visit | Attending: Internal Medicine | Admitting: Internal Medicine

## 2018-01-20 ENCOUNTER — Encounter (HOSPITAL_COMMUNITY)
Admission: RE | Disposition: A | Discharge status: Home / Self Care | Payer: Self-pay | Source: Ambulatory Visit | Attending: Internal Medicine

## 2018-01-20 DIAGNOSIS — F429 Obsessive-compulsive disorder, unspecified: Secondary | ICD-10-CM | POA: Diagnosis not present

## 2018-01-20 DIAGNOSIS — K7460 Unspecified cirrhosis of liver: Secondary | ICD-10-CM

## 2018-01-20 DIAGNOSIS — R1314 Dysphagia, pharyngoesophageal phase: Secondary | ICD-10-CM | POA: Insufficient documentation

## 2018-01-20 DIAGNOSIS — K228 Other specified diseases of esophagus: Secondary | ICD-10-CM | POA: Diagnosis not present

## 2018-01-20 DIAGNOSIS — R634 Abnormal weight loss: Secondary | ICD-10-CM | POA: Insufficient documentation

## 2018-01-20 DIAGNOSIS — E119 Type 2 diabetes mellitus without complications: Secondary | ICD-10-CM | POA: Diagnosis not present

## 2018-01-20 DIAGNOSIS — Z881 Allergy status to other antibiotic agents status: Secondary | ICD-10-CM | POA: Diagnosis not present

## 2018-01-20 DIAGNOSIS — K766 Portal hypertension: Secondary | ICD-10-CM | POA: Insufficient documentation

## 2018-01-20 DIAGNOSIS — Z794 Long term (current) use of insulin: Secondary | ICD-10-CM | POA: Insufficient documentation

## 2018-01-20 DIAGNOSIS — Z87891 Personal history of nicotine dependence: Secondary | ICD-10-CM | POA: Insufficient documentation

## 2018-01-20 DIAGNOSIS — J449 Chronic obstructive pulmonary disease, unspecified: Secondary | ICD-10-CM | POA: Diagnosis not present

## 2018-01-20 DIAGNOSIS — I2510 Atherosclerotic heart disease of native coronary artery without angina pectoris: Secondary | ICD-10-CM | POA: Insufficient documentation

## 2018-01-20 DIAGNOSIS — K449 Diaphragmatic hernia without obstruction or gangrene: Secondary | ICD-10-CM | POA: Insufficient documentation

## 2018-01-20 DIAGNOSIS — Z79899 Other long term (current) drug therapy: Secondary | ICD-10-CM | POA: Insufficient documentation

## 2018-01-20 DIAGNOSIS — Z882 Allergy status to sulfonamides status: Secondary | ICD-10-CM | POA: Insufficient documentation

## 2018-01-20 DIAGNOSIS — R079 Chest pain, unspecified: Secondary | ICD-10-CM | POA: Diagnosis not present

## 2018-01-20 DIAGNOSIS — K625 Hemorrhage of anus and rectum: Secondary | ICD-10-CM | POA: Diagnosis not present

## 2018-01-20 DIAGNOSIS — I119 Hypertensive heart disease without heart failure: Secondary | ICD-10-CM | POA: Diagnosis not present

## 2018-01-20 DIAGNOSIS — Z79890 Hormone replacement therapy (postmenopausal): Secondary | ICD-10-CM | POA: Insufficient documentation

## 2018-01-20 DIAGNOSIS — Z888 Allergy status to other drugs, medicaments and biological substances status: Secondary | ICD-10-CM | POA: Insufficient documentation

## 2018-01-20 DIAGNOSIS — R1032 Left lower quadrant pain: Secondary | ICD-10-CM | POA: Insufficient documentation

## 2018-01-20 DIAGNOSIS — K317 Polyp of stomach and duodenum: Secondary | ICD-10-CM | POA: Diagnosis not present

## 2018-01-20 DIAGNOSIS — K602 Anal fissure, unspecified: Secondary | ICD-10-CM | POA: Diagnosis not present

## 2018-01-20 DIAGNOSIS — G4730 Sleep apnea, unspecified: Secondary | ICD-10-CM | POA: Diagnosis not present

## 2018-01-20 DIAGNOSIS — R112 Nausea with vomiting, unspecified: Secondary | ICD-10-CM | POA: Insufficient documentation

## 2018-01-20 DIAGNOSIS — R1310 Dysphagia, unspecified: Secondary | ICD-10-CM | POA: Insufficient documentation

## 2018-01-20 DIAGNOSIS — R109 Unspecified abdominal pain: Secondary | ICD-10-CM | POA: Insufficient documentation

## 2018-01-20 DIAGNOSIS — R1115 Cyclical vomiting syndrome unrelated to migraine: Secondary | ICD-10-CM

## 2018-01-20 DIAGNOSIS — M797 Fibromyalgia: Secondary | ICD-10-CM | POA: Diagnosis not present

## 2018-01-20 DIAGNOSIS — K644 Residual hemorrhoidal skin tags: Secondary | ICD-10-CM | POA: Insufficient documentation

## 2018-01-20 DIAGNOSIS — Z91030 Bee allergy status: Secondary | ICD-10-CM | POA: Insufficient documentation

## 2018-01-20 DIAGNOSIS — K3189 Other diseases of stomach and duodenum: Secondary | ICD-10-CM

## 2018-01-20 DIAGNOSIS — Z7982 Long term (current) use of aspirin: Secondary | ICD-10-CM | POA: Insufficient documentation

## 2018-01-20 HISTORY — PX: COLONOSCOPY WITH PROPOFOL: SHX5780

## 2018-01-20 HISTORY — PX: ESOPHAGEAL DILATION: SHX303

## 2018-01-20 HISTORY — PX: ESOPHAGOGASTRODUODENOSCOPY (EGD) WITH PROPOFOL: SHX5813

## 2018-01-20 HISTORY — DX: Sleep apnea, unspecified: G47.30

## 2018-01-20 LAB — GLUCOSE, CAPILLARY
Glucose-Capillary: 134 mg/dL — ABNORMAL HIGH (ref 70–99)
Glucose-Capillary: 108 mg/dL — ABNORMAL HIGH (ref 70–99)

## 2018-01-20 LAB — IRON AND TIBC
Iron: 75 ug/dL (ref 28–170)
Saturation Ratios: 16 % (ref 10.4–31.8)
TIBC: 456 ug/dL — ABNORMAL HIGH (ref 250–450)
UIBC: 381 ug/dL

## 2018-01-20 LAB — FERRITIN: Ferritin: 60 ng/mL (ref 11–307)

## 2018-01-20 SURGERY — COLONOSCOPY WITH PROPOFOL
Anesthesia: Monitor Anesthesia Care

## 2018-01-20 MED ORDER — HYDROCODONE-ACETAMINOPHEN 7.5-325 MG PO TABS: 1.0000 | ORAL_TABLET | Freq: Once | ORAL | Status: DC | PRN

## 2018-01-20 MED ORDER — LACTATED RINGERS IV SOLN: INTRAVENOUS | Status: DC

## 2018-01-20 MED ORDER — LIDOCAINE VISCOUS HCL 2 % MT SOLN
OROMUCOSAL | Status: AC
  Filled 2018-01-20: qty 15

## 2018-01-20 MED ORDER — CHLORHEXIDINE GLUCONATE CLOTH 2 % EX PADS: 6.0000 | MEDICATED_PAD | Freq: Once | CUTANEOUS | Status: DC

## 2018-01-20 MED ORDER — MEPERIDINE HCL 100 MG/ML IJ SOLN: 6.2500 mg | INTRAMUSCULAR | Status: DC | PRN

## 2018-01-20 MED ORDER — MIDAZOLAM HCL 2 MG/2ML IJ SOLN
INTRAMUSCULAR | Status: AC
  Filled 2018-01-20: qty 2

## 2018-01-20 MED ORDER — LIDOCAINE VISCOUS HCL 2 % MT SOLN
OROMUCOSAL | Status: DC | PRN
  Administered 2018-01-20: 6 mL via OROMUCOSAL

## 2018-01-20 MED ORDER — PHENYLEPHRINE HCL 10 MG/ML IJ SOLN
INTRAMUSCULAR | Status: DC | PRN
  Administered 2018-01-20: 80 ug via INTRAVENOUS

## 2018-01-20 MED ORDER — PROMETHAZINE HCL 25 MG/ML IJ SOLN: 6.2500 mg | INTRAMUSCULAR | Status: DC | PRN

## 2018-01-20 MED ORDER — PROPOFOL 10 MG/ML IV BOLUS
INTRAVENOUS | Status: DC | PRN
  Administered 2018-01-20 (×3): 20 mg via INTRAVENOUS

## 2018-01-20 MED ORDER — HYDROMORPHONE HCL 1 MG/ML IJ SOLN: 0.2500 mg | INTRAMUSCULAR | Status: DC | PRN

## 2018-01-20 MED ORDER — LACTATED RINGERS IV SOLN
INTRAVENOUS | Status: DC | PRN
  Administered 2018-01-20: 11:00:00 via INTRAVENOUS

## 2018-01-20 MED ORDER — PHENYLEPHRINE 40 MCG/ML (10ML) SYRINGE FOR IV PUSH (FOR BLOOD PRESSURE SUPPORT)
PREFILLED_SYRINGE | INTRAVENOUS | Status: AC
  Filled 2018-01-20: qty 10

## 2018-01-20 MED ORDER — PROPOFOL 10 MG/ML IV BOLUS
INTRAVENOUS | Status: AC
  Filled 2018-01-20: qty 40

## 2018-01-20 MED ORDER — MIDAZOLAM HCL 5 MG/5ML IJ SOLN
INTRAMUSCULAR | Status: DC | PRN
  Administered 2018-01-20: 2 mg via INTRAVENOUS

## 2018-01-20 MED ORDER — PROPOFOL 500 MG/50ML IV EMUL
INTRAVENOUS | Status: DC | PRN
  Administered 2018-01-20: 125 ug/kg/min via INTRAVENOUS

## 2018-01-20 MED ORDER — DILTIAZEM GEL 2 %: 1.0000 "application " | Freq: Two times a day (BID) | CUTANEOUS | 2 refills | Status: DC

## 2018-01-20 NOTE — Op Note (Signed)
[facility] Patient Name: Amber Fischer Procedure Date: 01/20/2018 11:59 AM MRN: [id number] Date of Birth: [birth year] Attending MD: Hildred Laser , MD CSN: [id number] Age: 57 Admit Type: Outpatient Procedure:                Colonoscopy Indications:              Abdominal pain in the left lower quadrant, Rectal                            bleeding Providers:                Hildred Laser, MD, Lurline Del, RN, Nelma Rothman,                            Technician Referring MD:             Stoney Bang, MD Medicines:                Propofol per Anesthesia Complications:            No immediate complications. Estimated Blood Loss:     Estimated blood loss: none. Procedure:                Pre-Anesthesia Assessment:                           - Prior to the procedure, a History and Physical                            was performed, and patient medications and                            allergies were reviewed. The patient's tolerance of                            previous anesthesia was also reviewed. The risks                            and benefits of the procedure and the sedation                            options and risks were discussed with the patient.                            All questions were answered, and informed consent                            was obtained. Prior Anticoagulants: The patient                            last took aspirin 3 days prior to the procedure.                            ASA Grade Assessment: III - A patient with severe  systemic disease. After reviewing the risks and                            benefits, the patient was deemed in satisfactory                            condition to undergo the procedure.                           After obtaining informed consent, the colonoscope                            was passed under direct vision. Throughout the                            procedure, the patient's blood pressure, pulse, and                            oxygen saturations were monitored continuously. The                            [id number]-[id number] ([id number]) scope was introduced through                            the anus and advanced to the the cecum, identified                            by appendiceal orifice and ileocecal valve. The                            colonoscopy was performed without difficulty. The                            patient tolerated the procedure well. The quality                            of the bowel preparation was excellent. The                            ileocecal valve, appendiceal orifice, and rectum                            were photographed. Scope In: 12:02:22 PM Scope Out: 12:21:24 PM Scope Withdrawal Time: 0 hours 10 minutes 42 seconds  Total Procedure Duration: 0 hours 19 minutes 2 seconds  Findings:      The perianal and digital rectal examinations were normal.      The colon (entire examined portion) appeared normal.      A anal fissure was found in the anal canal.      External hemorrhoids were found during retroflexion. The hemorrhoids       were small. Impression:               - The entire examined colon is normal.                           -  Anal fissure.                           - External hemorrhoids.                           - No specimens collected. Moderate Sedation:      Per Anesthesia Care Recommendation:           - Patient has a contact number available for                            emergencies. The signs and symptoms of potential                            delayed complications were discussed with the                            patient. Return to normal activities tomorrow.                            Written discharge instructions were provided to the                            patient.                           - Resume previous diet today.                           - Continue present medications.                           - Diltazem gel 2%. Apply to anal  canal bid for 4                            weeks.                           - Repeat colonoscopy in 10 years for screening                            purposes. Procedure Code(s):        --- Professional ---                           [phone], Colonoscopy, flexible; diagnostic, including                            collection of specimen(s) by brushing or washing,                            when performed (separate procedure) Diagnosis Code(s):        --- Professional ---                           K64.4, Residual hemorrhoidal skin tags  K60.2, Anal fissure, unspecified                           R10.32, Left lower quadrant pain                           K62.5, Hemorrhage of anus and rectum CPT copyright 2017 American Medical Association. All rights reserved. The codes documented in this report are preliminary and upon coder review may  be revised to meet current compliance requirements. Hildred Laser, MD Hildred Laser, MD 01/20/2018 1:23:24 PM This report has been signed electronically. Number of Addenda: 0

## 2018-01-20 NOTE — Anesthesia Preprocedure Evaluation (Signed)
Anesthesia Evaluation  Patient identified by MRN, date of birth, ID band Patient awake    Reviewed: Allergy & Precautions, H&P , NPO status , Patient's Chart, lab work & pertinent test results, reviewed documented beta blocker date and time   Airway Mallampati: II  TM Distance: >3 FB Neck ROM: full    Dental no notable dental hx. (+) Teeth Intact, Dental Advidsory Given   Pulmonary neg pulmonary ROS, asthma , sleep apnea , COPD,  COPD inhaler, former smoker,    Pulmonary exam normal breath sounds clear to auscultation       Cardiovascular Exercise Tolerance: Good hypertension, + CAD  negative cardio ROS   Rhythm:regular Rate:Normal     Neuro/Psych PSYCHIATRIC DISORDERS Anxiety  Neuromuscular disease negative neurological ROS  negative psych ROS   GI/Hepatic negative GI ROS, Neg liver ROS, GERD  ,  Endo/Other  negative endocrine ROSdiabetes, Type 2Hypothyroidism Morbid obesity  Renal/GU negative Renal ROS  negative genitourinary   Musculoskeletal   Abdominal   Peds  Hematology negative hematology ROS (+)   Anesthesia Other Findings   Reproductive/Obstetrics negative OB ROS                             Anesthesia Physical Anesthesia Plan  ASA: IV  Anesthesia Plan: MAC   Post-op Pain Management:    Induction:   PONV Risk Score and Plan:   Airway Management Planned:   Additional Equipment:   Intra-op Plan:   Post-operative Plan:   Informed Consent: I have reviewed the patients History and Physical, chart, labs and discussed the procedure including the risks, benefits and alternatives for the proposed anesthesia with the patient or authorized representative who has indicated his/her understanding and acceptance.   Dental Advisory Given  Plan Discussed with: CRNA and Anesthesiologist  Anesthesia Plan Comments:         Anesthesia Quick Evaluation

## 2018-01-20 NOTE — Transfer of Care (Signed)
Immediate Anesthesia Transfer of Care Note  Patient: Amber Fischer  Procedure(s) Performed: COLONOSCOPY WITH PROPOFOL (N/A ) ESOPHAGOGASTRODUODENOSCOPY (EGD) WITH PROPOFOL (N/A ) ESOPHAGEAL DILATION (N/A )  Patient Location: PACU  Anesthesia Type:MAC  Level of Consciousness: awake, alert  and oriented  Airway & Oxygen Therapy: Patient Spontanous Breathing  Post-op Assessment: Report given to RN  Post vital signs: Reviewed and stable  Last Vitals:  Vitals Value Taken Time  BP 123/65 01/20/2018 12:31 PM  Temp    Pulse 83 01/20/2018 12:33 PM  Resp 16 01/20/2018 12:33 PM  SpO2 96 % 01/20/2018 12:33 PM  Vitals shown include unvalidated device data.  Last Pain:  Vitals:   01/20/18 1053  TempSrc: (P) Oral         Complications: No apparent anesthesia complications

## 2018-01-20 NOTE — Anesthesia Postprocedure Evaluation (Signed)
Anesthesia Post Note  Patient: Amber Fischer  Procedure(s) Performed: COLONOSCOPY WITH PROPOFOL (N/A ) ESOPHAGOGASTRODUODENOSCOPY (EGD) WITH PROPOFOL (N/A ) ESOPHAGEAL DILATION (N/A )  Patient location during evaluation: PACU Anesthesia Type: MAC Level of consciousness: awake and alert and oriented Pain management: pain level controlled Vital Signs Assessment: post-procedure vital signs reviewed and stable Respiratory status: spontaneous breathing Cardiovascular status: stable Postop Assessment: no apparent nausea or vomiting Anesthetic complications: no     Last Vitals:  Vitals:   01/20/18 1053  Pulse: (P) 89  Resp: (P) 18  Temp: (P) 36.6 C    Last Pain:  Vitals:   01/20/18 1053  TempSrc: (P) Oral                 Birdell Frasier

## 2018-01-20 NOTE — H&P (Signed)
Amber Fischer is an 57 y.o. female.   Chief Complaint: Patient is here for EGD, ED and colonoscopy. HPI: Patient is 57 year old Caucasian female who was seen in the office 1 month ago with multiple medical problems including nausea vomiting dysphagia to solids as well as lower abdominal pain rectal discharge rectal bleeding and weight loss.  CBC and comprehensive chemistry panel were normal except elevated blood glucose.  Patient is diabetic.  She also underwent abdominal pelvic CT.  Liver contour suggested changes of cirrhosis but no abnormality was noted to account for her ongoing symptoms.  She also has a history of rectal discharge and perianal excoriation. Patient is new complaint is 1 of chest pain.  She was seen in emergency room 1 week ago.  She had noninvasive testing in March 2019 and scan was negative for changes of ischemia. Today she is also complaining of epigastric pain.  She denies hematemesis melena but she has noted intermittent rectal bleeding.. She says she had multiple episodes of vomiting when she took the prep.  She did not vomit any blood.  Past Medical History:  Diagnosis Date  . Asthma   . Back pain, chronic   . Cirrhosis of liver ([place])   . COPD (chronic obstructive pulmonary disease) ([place])   . Coronary atherosclerosis of native coronary artery    Prior stent RCA - patent May 2014 with otherwise nonobstructive disease  . Epigastric abdominal pain 08/19/2010  . Essential hypertension, benign   . Fibromyalgia   . OCD (obsessive compulsive disorder)   . Rectal bleed 11/02/2010  . Sciatica   . Sleep apnea   . Type 2 diabetes mellitus ([place])     Past Surgical History:  Procedure Laterality Date  . ABDOMINAL HYSTERECTOMY    . CHOLECYSTECTOMY    . COLONOSCOPY  11/30/2010  . COLONOSCOPY W/ BIOPSIES  12/24/2010   NUR  . COLONOSCOPY, ESOPHAGOGASTRODUODENOSCOPY (EGD) AND ESOPHAGEAL DILATION N/A 04/05/2013   Procedure: COLONOSCOPY, ESOPHAGOGASTRODUODENOSCOPY (EGD) AND  ESOPHAGEAL DILATION;  Surgeon: Rogene Houston, MD;  [facility];  Service: Endoscopy;  Laterality: N/A;  . CORONARY ANGIOPLASTY     stent 5 years ago  . ESOPHAGEAL DILATION N/A 08/14/2014   Procedure: ESOPHAGEAL DILATION;  Surgeon: Rogene Houston, MD;  Location: [facility];  Service: Endoscopy;  Laterality: N/A;  . ESOPHAGOGASTRODUODENOSCOPY  03/22/2012   Procedure: ESOPHAGOGASTRODUODENOSCOPY (EGD);  Surgeon: Rogene Houston, MD;  [facility];  Service: Endoscopy;  Laterality: N/A;  300  . ESOPHAGOGASTRODUODENOSCOPY N/A 08/14/2014   Procedure: ESOPHAGOGASTRODUODENOSCOPY (EGD);  Surgeon: Rogene Houston, MD;  [facility]: [facility];  Service: Endoscopy;  Laterality: N/A;  225    Family History  Problem Relation Age of Onset  . Stroke Mother   . Memory loss Mother   . Fibromyalgia Mother   . Diabetes Father   . Kidney disease Father   . Heart attack Father   . Multiple sclerosis Sister   . Autoimmune disease Daughter   . Fibromyalgia Daughter   . Fibromyalgia Daughter   . Fibromyalgia Son   . Diabetes Son    Social History:  reports that she quit smoking about 6 years ago. Her smoking use included cigarettes. She started smoking about 40 years ago. She has a 10.00 pack-year smoking history. She has never used smokeless tobacco. She reports that she does not drink alcohol or use drugs.  Allergies:  Allergies  Allergen Reactions  . Ciprofloxacin Itching and Nausea And Vomiting  .  Sulfonamide Derivatives Hives  . Bee Venom Swelling and Rash  . Pravastatin Rash    "swelling in legs/arms, bumps all over"     Medications Prior to Admission  Medication Sig Dispense Refill  . albuterol (PROAIR HFA) 108 (90 BASE) MCG/ACT inhaler Inhale 2 puffs into the lungs every 6 (six) hours as needed for wheezing or shortness of breath.    Marland Kitchen aspirin EC 81 MG tablet Take 81 mg by mouth every morning.     . Biotin 300 MCG TABS Take 300 mcg by mouth every morning.     .  Evolocumab (REPATHA SURECLICK) 086 MG/ML SOAJ Inject 140 mg into the skin every 14 (fourteen) days. 2 pen 12  . fluconazole (DIFLUCAN) 150 MG tablet TAKE 1 TABLET(150MG ) BY ORAL ROUTE X1 DOSE  0  . HYDROcodone-acetaminophen (NORCO/VICODIN) 5-325 MG tablet Take 1 tablet by mouth 3 (three) times daily as needed for moderate pain.    Marland Kitchen insulin aspart (NOVOLOG) 100 UNIT/ML injection Inject 3-6 Units into the skin 3 (three) times daily as needed for high blood sugar. Inject 3 units if blood sugar is 150 -200, inject 6 units if over 200    . INVOKANA 100 MG TABS tablet TAKE 1 TABLET BY MOUTH EVERY DAY (Patient taking differently: TAKE 100MG  BY MOUTH ONCE DAILY IN THE MORNING) 90 tablet 0  . isosorbide mononitrate (IMDUR) 30 MG 24 hr tablet Take 30 mg by mouth daily.    Marland Kitchen levothyroxine (SYNTHROID, LEVOTHROID) 88 MCG tablet Take 88 mcg by mouth daily before breakfast.    . loratadine (CLARITIN) 10 MG tablet Take 10 mg by mouth daily.    . metFORMIN (GLUCOPHAGE) 500 MG tablet Take 500 mg by mouth 2 (two) times daily with a meal.    . methocarbamol (ROBAXIN) 500 MG tablet Take 500 mg by mouth every 8 (eight) hours as needed for muscle spasms.   1  . metoprolol (LOPRESSOR) 50 MG tablet Take 50 mg by mouth 2 (two) times daily.    . polyethylene glycol powder (GLYCOLAX/MIRALAX) powder Take 17 g by mouth daily as needed for moderate constipation.   1  . pregabalin (LYRICA) 75 MG capsule Take 75 mg by mouth at bedtime.     Marland Kitchen tiotropium (SPIRIVA HANDIHALER) 18 MCG inhalation capsule Place 1 capsule (18 mcg total) into inhaler and inhale daily. (Patient taking differently: Place 18 mcg into inhaler and inhale daily as needed (for shortness of breath). ) 30 capsule 6  . topiramate (TOPAMAX) 100 MG tablet Take 100 mg by mouth 2 (two) times daily.   0  . triamcinolone cream (KENALOG) 0.1 % APPLY TO AFFECTED AREA TWICE DAILY FOR 1 WEEK THEN AS NEEDED  1  . esomeprazole (NEXIUM) 40 MG capsule Take 1 capsule (40 mg total)  by mouth daily at 12 noon. 30 capsule 0  . isosorbide mononitrate (IMDUR) 60 MG 24 hr tablet Take 1 tablet (60 mg total) by mouth daily. (Patient not taking: Reported on 01/05/2018) 90 tablet 3  . nitroGLYCERIN (NITROSTAT) 0.4 MG SL tablet Place 1 tablet (0.4 mg total) under the tongue every 5 (five) minutes as needed for chest pain. 25 tablet 3  . nystatin-triamcinolone (MYCOLOG II) cream Apply 1 application topically 2 (two) times daily. 30 g 1  . ondansetron (ZOFRAN ODT) 4 MG disintegrating tablet Take 1 tablet (4 mg total) by mouth every 8 (eight) hours as needed. 10 tablet 0    Results for orders placed or performed during the hospital encounter  of 01/20/18 (from the past 48 hour(s))  Glucose, capillary     Status: Abnormal   Collection Time: 01/20/18 10:41 AM  Result Value Ref Range   Glucose-Capillary 134 (H) 70 - 99 mg/dL   No results found.  ROS  Pulse (P) 89, temperature (P) 97.9 F (36.6 C), temperature source (P) Oral, resp. rate (P) 18. Physical Exam  Constitutional: She appears well-developed and well-nourished.  HENT:  Mouth/Throat: Oropharynx is clear and moist.  Eyes: Conjunctivae are normal. No scleral icterus.  Neck: No thyromegaly present.  Cardiovascular: Normal rate, regular rhythm and normal heart sounds.  No murmur heard. Respiratory: Effort normal and breath sounds normal.  GI:  Abdomen is full but symmetrical.  On palpation is soft.  She has mild tenderness midepigastrium as well as across lower abdomen.  No organomegaly or masses.  Musculoskeletal: She exhibits no edema.  Lymphadenopathy:    She has no cervical adenopathy.  Neurological: She is alert.  Skin: Skin is warm and dry.     Assessment/Plan Nausea vomiting dysphagia and epigastric pain. Patient also complains of chest pain which started since her office visit 1 month ago. Rectal discharge rectal bleeding and lower abdominal pain. Diagnostic and therapeutic EGD. Diagnostic  colonoscopy.  Hildred Laser, MD 01/20/2018, 11:21 AM

## 2018-01-20 NOTE — Discharge Instructions (Signed)
No aspirin or NSAIDs for 24 hours. Resume usual medications and diet.  Diltiazem gel apply to anal twice daily for 4 weeks. No driving for 24 hours. Physician will call with results of biopsy and blood test.      Esophagogastroduodenoscopy, Care After Refer to this sheet in the next few weeks. These instructions provide you with information about caring for yourself after your procedure. Your health care provider may also give you more specific instructions. Your treatment has been planned according to current medical practices, but problems sometimes occur. Call your health care provider if you have any problems or questions after your procedure. What can I expect after the procedure? After the procedure, it is common to have:  A sore throat.  Nausea.  Bloating.  Dizziness.  Fatigue.  Follow these instructions at home:  Do not eat or drink anything until the numbing medicine (local anesthetic) has worn off and your gag reflex has returned. You will know that the local anesthetic has worn off when you can swallow comfortably.  Do not drive for 24 hours if you received a medicine to help you relax (sedative).  If your health care provider took a tissue sample for testing during the procedure, make sure to get your test results. This is your responsibility. Ask your health care provider or the department performing the test when your results will be ready.  Keep all follow-up visits as told by your health care provider. This is important. Contact a health care provider if:  You cannot stop coughing.  You are not urinating.  You are urinating less than usual. Get help right away if:  You have trouble swallowing.  You cannot eat or drink.  You have throat or chest pain that gets worse.  You are dizzy or light-headed.  You faint.  You have nausea or vomiting.  You have chills.  You have a fever.  You have severe abdominal pain.  You have black, tarry, or bloody  stools. This information is not intended to replace advice given to you by your health care provider. Make sure you discuss any questions you have with your health care provider. Document Released: 05/24/2012 Document Revised: 11/13/2015 Document Reviewed: 05/01/2015 Elsevier Interactive Patient Education  2018 [employer].      Colonoscopy, Adult, Care After This sheet gives you information about how to care for yourself after your procedure. Your doctor may also give you more specific instructions. If you have problems or questions, call your doctor. Follow these instructions at home: General instructions   For the first 24 hours after the procedure: ? Do not drive or use machinery. ? Do not sign important documents. ? Do not drink alcohol. ? Do your daily activities more slowly than normal. ? Eat foods that are soft and easy to digest. ? Rest often.  Take over-the-counter or prescription medicines only as told by your doctor.  It is up to you to get the results of your procedure. Ask your doctor, or the department performing the procedure, when your results will be ready. To help cramping and bloating:  Try walking around.  Put heat on your belly (abdomen) as told by your doctor. Use a heat source that your doctor recommends, such as a moist heat pack or a heating pad. ? Put a towel between your skin and the heat source. ? Leave the heat on for 20-30 minutes. ? Remove the heat if your skin turns bright red. This is especially important if you cannot  feel pain, heat, or cold. You can get burned. Eating and drinking  Drink enough fluid to keep your pee (urine) clear or pale yellow.  Return to your normal diet as told by your doctor. Avoid heavy or fried foods that are hard to digest.  Avoid drinking alcohol for as long as told by your doctor. Contact a doctor if:  You have blood in your poop (stool) 2-3 days after the procedure. Get help right away if:  You have more  than a small amount of blood in your poop.  You see large clumps of tissue (blood clots) in your poop.  Your belly is swollen.  You feel sick to your stomach (nauseous).  You throw up (vomit).  You have a fever.  You have belly pain that gets worse, and medicine does not help your pain. This information is not intended to replace advice given to you by your health care provider. Make sure you discuss any questions you have with your health care provider. Document Released: 07/10/2010 Document Revised: 03/01/2016 Document Reviewed: 03/01/2016 Elsevier Interactive Patient Education  2017 [employer], Care After These instructions provide you with information about caring for yourself after your procedure. Your health care provider may also give you more specific instructions. Your treatment has been planned according to current medical practices, but problems sometimes occur. Call your health care provider if you have any problems or questions after your procedure. What can I expect after the procedure? After your procedure, it is common to:  Feel sleepy for several hours.  Feel clumsy and have poor balance for several hours.  Feel forgetful about what happened after the procedure.  Have poor judgment for several hours.  Feel nauseous or vomit.  Have a sore throat if you had a breathing tube during the procedure.  Follow these instructions at home: For at least 24 hours after the procedure:   Do not: ? Participate in activities in which you could fall or become injured. ? Drive. ? Use heavy machinery. ? Drink alcohol. ? Take sleeping pills or medicines that cause drowsiness. ? Make important decisions or sign legal documents. ? Take care of children on your own.  Rest. Eating and drinking  Follow the diet that is recommended by your health care provider.  If you vomit, drink water, juice, or soup when you can drink without  vomiting.  Make sure you have little or no nausea before eating solid foods. General instructions  Have a responsible adult stay with you until you are awake and alert.  Take over-the-counter and prescription medicines only as told by your health care provider.  If you smoke, do not smoke without supervision.  Keep all follow-up visits as told by your health care provider. This is important. Contact a health care provider if:  You keep feeling nauseous or you keep vomiting.  You feel light-headed.  You develop a rash.  You have a fever. Get help right away if:  You have trouble breathing. This information is not intended to replace advice given to you by your health care provider. Make sure you discuss any questions you have with your health care provider. Document Released: 09/28/2015 Document Revised: 01/28/2016 Document Reviewed: 09/28/2015 Elsevier Interactive Patient Education  [employer].

## 2018-01-20 NOTE — Op Note (Signed)
[facility] Patient Name: Amber Fischer Procedure Date: 01/20/2018 11:40 AM MRN: [id number] Date of Birth: 1961-01-14 Attending MD: Hildred Laser , MD CSN: [id number] Age: 57 Admit Type: Outpatient Procedure:                Upper GI endoscopy Indications:              Esophageal dysphagia, Unexplained chest pain,                            Nausea with vomiting Providers:                Hildred Laser, MD, Lurline Del, RN, Nelma Rothman,                            Technician Referring MD:             Stoney Bang, MD Medicines:                Lidocaine spray, Propofol per Anesthesia Complications:            No immediate complications. Estimated Blood Loss:     Estimated blood loss was minimal. Procedure:                Pre-Anesthesia Assessment:                           - Prior to the procedure, a History and Physical                            was performed, and patient medications and                            allergies were reviewed. The patient's tolerance of                            previous anesthesia was also reviewed. The risks                            and benefits of the procedure and the sedation                            options and risks were discussed with the patient.                            All questions were answered, and informed consent                            was obtained. Prior Anticoagulants: The patient                            last took aspirin 3 days prior to the procedure.                            ASA Grade Assessment: III - A patient with severe  systemic disease. After reviewing the risks and                            benefits, the patient was deemed in satisfactory                            condition to undergo the procedure.                           After obtaining informed consent, the endoscope was                            passed under direct vision. Throughout the                            procedure, the  patient's blood pressure, pulse, and                            oxygen saturations were monitored continuously. The                            GIF-H190 ([id number]) scope was introduced through the                            mouth, and advanced to the second part of duodenum.                            The upper GI endoscopy was accomplished without                            difficulty. The patient tolerated the procedure                            well. Scope In: 11:45:10 AM Scope Out: 11:56:30 AM Total Procedure Duration: 0 hours 11 minutes 20 seconds  Findings:      The cardia was normal.      The examined esophagus was normal.      The Z-line was irregular and was found 36 cm from the incisors.      A 2 cm hiatal hernia was present.      No endoscopic abnormality was evident in the esophagus to explain the       patient's complaint of dysphagia. It was decided, however, to proceed       with dilation of the entire esophagus. The scope was withdrawn. Dilation       was performed with a Maloney dilator with mild resistance at 56 Fr. The       dilation site was examined following endoscope reinsertion and showed no       change and no bleeding, mucosal tear or perforation.      A few small sessile polyps were found in the gastric fundus and in the       gastric body. Biopsies were taken from three polyps with a cold forceps       for histology. The pathology specimen was placed into Bottle Number 1.      Mild portal hypertensive gastropathy  was found in the gastric fundus.      The exam was otherwise without abnormality.      The duodenal bulb and second portion of the duodenum were normal. Impression:               - Normal cardia.                           - Normal esophagus.                           - Z-line irregular, 36 cm from the incisors.                           - 2 cm hiatal hernia.                           - No endoscopic esophageal abnormality to explain                             patient's dysphagia. Esophagus dilated. Dilated.                           - A few gastric polyps. Biopsied.                           - Portal hypertensive gastropathy.                           - The examination was otherwise normal.                           - Normal duodenal bulb and second portion of the                            duodenum. Moderate Sedation:      Per Anesthesia Care Recommendation:           - Patient has a contact number available for                            emergencies. The signs and symptoms of potential                            delayed complications were discussed with the                            patient. Return to normal activities tomorrow.                            Written discharge instructions were provided to the                            patient.                           - Resume previous diet today.                           -  Continue present medications.                           - No aspirin, ibuprofen, naproxen, or other                            non-steroidal anti-inflammatory drugs for 1 day.                           - Await pathology results. Procedure Code(s):        --- Professional ---                           [phone], Esophagogastroduodenoscopy, flexible,                            transoral; with biopsy, single or multiple                           43450, Dilation of esophagus, by unguided sound or                            bougie, single or multiple passes Diagnosis Code(s):        --- Professional ---                           K22.8, Other specified diseases of esophagus                           K44.9, Diaphragmatic hernia without obstruction or                            gangrene                           K31.7, Polyp of stomach and duodenum                           K76.6, Portal hypertension                           K31.89, Other diseases of stomach and duodenum                           R13.14, Dysphagia,  pharyngoesophageal phase                           R07.9, Chest pain, unspecified                           R11.2, Nausea with vomiting, unspecified CPT copyright 2017 American Medical Association. All rights reserved. The codes documented in this report are preliminary and upon coder review may  be revised to meet current compliance requirements. Hildred Laser, MD Hildred Laser, MD 01/20/2018 1:16:40 PM This report has been signed electronically. Number of Addenda: 0

## 2018-01-21 LAB — ANTI-SMOOTH MUSCLE ANTIBODY, IGG: F-ACTIN AB IGG: 8 U (ref 0–19)

## 2018-01-21 LAB — ANA: Anti Nuclear Antibody(ANA): NEGATIVE

## 2018-01-21 LAB — MITOCHONDRIAL ANTIBODIES

## 2018-01-21 LAB — HEPATITIS B SURFACE ANTIGEN: Hepatitis B Surface Ag: NEGATIVE

## 2018-01-21 LAB — HEPATITIS C ANTIBODY

## 2018-01-30 ENCOUNTER — Encounter (HOSPITAL_COMMUNITY): Payer: Self-pay | Admitting: Internal Medicine

## 2018-02-02 ENCOUNTER — Telehealth: Payer: Self-pay | Admitting: Adult Health

## 2018-02-02 NOTE — Telephone Encounter (Signed)
Pt said she has never rec'd a call to schedule the MRI. She said at the last [facility] said she would make sure she would get a call to schedule it. I advised her the order was sent to GI and they should have called her. I apologized she did not rec' a call from GI. Said she understood it may not have been the clinic fault but siince this was the 2nd time it failed to be scheduled.  Appt for 02/22/18 has been c/a  FYI

## 2018-02-02 NOTE — Telephone Encounter (Signed)
I did send the order to GI on 04/11/17 & 10/11/17. But I do not see where [facility] reached out and I'm not sure why that is.

## 2018-02-02 NOTE — Telephone Encounter (Signed)
Raquel Sarna can you check on this.

## 2018-02-03 NOTE — Telephone Encounter (Signed)
Can we call GI and see what happened and make sure they call the patient and get the scheduled. Also please call the patient and explain what happened.

## 2018-02-06 NOTE — Telephone Encounter (Signed)
Noted! Thank you

## 2018-02-06 NOTE — Telephone Encounter (Signed)
I spoke to the patient and offered her for me to schedule her MRI for her at [facility] that way it would be closer to her and I would know she was scheduled.. She informed me that she would like to hold off on having the MRI. I told her if she changed her mind to let me know and I will get it scheduled for her.

## 2018-02-16 ENCOUNTER — Other Ambulatory Visit: Payer: Self-pay | Admitting: Pharmacist Clinician (PhC)/ Clinical Pharmacy Specialist

## 2018-02-16 DIAGNOSIS — E785 Hyperlipidemia, unspecified: Secondary | ICD-10-CM

## 2018-02-22 ENCOUNTER — Ambulatory Visit: Payer: Medicaid Other | Admitting: Adult Health

## 2018-02-28 ENCOUNTER — Encounter (INDEPENDENT_AMBULATORY_CARE_PROVIDER_SITE_OTHER): Payer: Self-pay | Admitting: Internal Medicine

## 2018-02-28 ENCOUNTER — Other Ambulatory Visit (INDEPENDENT_AMBULATORY_CARE_PROVIDER_SITE_OTHER): Payer: Self-pay | Admitting: Internal Medicine

## 2018-02-28 ENCOUNTER — Ambulatory Visit (INDEPENDENT_AMBULATORY_CARE_PROVIDER_SITE_OTHER): Payer: Medicaid Other | Admitting: Internal Medicine

## 2018-02-28 VITALS — BP 118/76 | HR 64 | Temp 97.3°F | Resp 18 | Ht 69.5 in | Wt 265.3 lb

## 2018-02-28 DIAGNOSIS — K219 Gastro-esophageal reflux disease without esophagitis: Secondary | ICD-10-CM

## 2018-02-28 DIAGNOSIS — R1312 Dysphagia, oropharyngeal phase: Secondary | ICD-10-CM | POA: Diagnosis not present

## 2018-02-28 DIAGNOSIS — R109 Unspecified abdominal pain: Secondary | ICD-10-CM | POA: Diagnosis not present

## 2018-02-28 DIAGNOSIS — K5900 Constipation, unspecified: Secondary | ICD-10-CM

## 2018-02-28 MED ORDER — HYOSCYAMINE SULFATE 0.125 MG SL SUBL: 0.1250 mg | SUBLINGUAL_TABLET | Freq: Four times a day (QID) | SUBLINGUAL | 0 refills | Status: DC | PRN

## 2018-02-28 MED ORDER — ESOMEPRAZOLE MAGNESIUM 40 MG PO CPDR: 40.0000 mg | DELAYED_RELEASE_CAPSULE | Freq: Every day | ORAL | 5 refills | Status: DC

## 2018-02-28 NOTE — Progress Notes (Signed)
Presenting complaint;  Follow-up for abdominal pain constipation weight loss and GERD.  Database and subjective:  Patient is 57 year old Caucasian female who is here for scheduled visit.  She was last seen on December 20, 2017 for multiple symptoms.  She underwent EGD ED and colonoscopy. Esophageal mucosa was normal.  Esophagus was dilated by passing 56 [place] dilator.  He has small sliding hiatal hernia and mild changes of portal gastropathy.  Colonoscopy was normal other than external hemorrhoids and small anal fissure which she was treated with diltiazem gel. She has kept symptom diary as recommended.  Symptom diary was reviewed with the patient.  She has had very few episodes of nausea without vomiting.  She is having bowel movement every day to every other day and only on one occasion she went 5 days without a bowel movement when she noted stool was hard.  She has not passed any blood.  She also denies painful defecation.  She is not using the topical cream because it causes burning.  Overall she feels better.  She is still having some chest upper and lower abdominal pain but is not frequent and it is mild. She has not lost anymore weight.  In fact she has gained 3 pounds. She states she is doing better and her daughter is not worried anymore. Her new complaint today is 1 of difficulty swallowing food.  She feels food stays in her throat and she has to swallow multiple times but before it goes down.  Once food passes her throat she does not have any difficulty.    Current Medications: Outpatient Encounter Medications as of 02/28/2018  Medication Sig  . albuterol (PROAIR HFA) 108 (90 BASE) MCG/ACT inhaler Inhale 2 puffs into the lungs every 6 (six) hours as needed for wheezing or shortness of breath.  Marland Kitchen aspirin EC 81 MG tablet Take 81 mg by mouth every morning.   . Biotin 300 MCG TABS Take 300 mcg by mouth every morning.   . cromolyn (OPTICROM) 4 % ophthalmic solution Place into both eyes  2 (two) times daily.  . cycloSPORINE (RESTASIS) 0.05 % ophthalmic emulsion Place 1 drop into both eyes 2 (two) times daily.  Marland Kitchen diltiazem 2 % GEL Apply 1 application topically 2 (two) times daily.  Marland Kitchen esomeprazole (NEXIUM) 40 MG capsule Take 1 capsule (40 mg total) by mouth daily at 12 noon.  . Evolocumab (REPATHA SURECLICK) 478 MG/ML SOAJ Inject 140 mg into the skin every 14 (fourteen) days.  . insulin aspart (NOVOLOG) 100 UNIT/ML injection Inject 3-6 Units into the skin 3 (three) times daily as needed for high blood sugar. Inject 3 units if blood sugar is 150 -200, inject 6 units if over 200  . INVOKANA 100 MG TABS tablet TAKE 1 TABLET BY MOUTH EVERY DAY (Patient taking differently: TAKE 100MG  BY MOUTH ONCE DAILY IN THE MORNING)  . isosorbide mononitrate (IMDUR) 30 MG 24 hr tablet Take 30 mg by mouth daily.  Marland Kitchen levothyroxine (SYNTHROID, LEVOTHROID) 88 MCG tablet Take 88 mcg by mouth daily before breakfast.  . loratadine (CLARITIN) 10 MG tablet Take 10 mg by mouth daily.  . metFORMIN (GLUCOPHAGE) 500 MG tablet Take 500 mg by mouth 2 (two) times daily with a meal.  . methocarbamol (ROBAXIN) 500 MG tablet Take 500 mg by mouth every 8 (eight) hours as needed for muscle spasms.   . metoprolol (LOPRESSOR) 50 MG tablet Take 50 mg by mouth 2 (two) times daily.  Marland Kitchen nystatin-triamcinolone (MYCOLOG II) cream Apply  1 application topically 2 (two) times daily.  . polyethylene glycol powder (GLYCOLAX/MIRALAX) powder Take 17 g by mouth daily as needed for moderate constipation.   . prednisoLONE acetate (PREDNISOLONE ACETATE P-F) 1 % ophthalmic suspension Place 1 drop into both eyes as needed.  . pregabalin (LYRICA) 75 MG capsule Take 75 mg by mouth at bedtime.   Marland Kitchen tiotropium (SPIRIVA HANDIHALER) 18 MCG inhalation capsule Place 1 capsule (18 mcg total) into inhaler and inhale daily. (Patient taking differently: Place 18 mcg into inhaler and inhale daily as needed (for shortness of breath). )  . topiramate (TOPAMAX)  100 MG tablet Take 100 mg by mouth 2 (two) times daily.   Marland Kitchen triamcinolone cream (KENALOG) 0.1 % APPLY TO AFFECTED AREA TWICE DAILY FOR 1 WEEK THEN AS NEEDED  . nitroGLYCERIN (NITROSTAT) 0.4 MG SL tablet Place 1 tablet (0.4 mg total) under the tongue every 5 (five) minutes as needed for chest pain. (Patient not taking: Reported on 02/28/2018)  . [DISCONTINUED] fluconazole (DIFLUCAN) 150 MG tablet TAKE 1 TABLET(150MG ) BY ORAL ROUTE X1 DOSE  . [DISCONTINUED] HYDROcodone-acetaminophen (NORCO/VICODIN) 5-325 MG tablet Take 1 tablet by mouth 3 (three) times daily as needed for moderate pain.  . [DISCONTINUED] ondansetron (ZOFRAN ODT) 4 MG disintegrating tablet Take 1 tablet (4 mg total) by mouth every 8 (eight) hours as needed. (Patient not taking: Reported on 02/28/2018)   No facility-administered encounter medications on file as of 02/28/2018.      Objective: Blood pressure 118/76, pulse 64, temperature (!) 97.3 F (36.3 C), temperature source Oral, resp. rate 18, height 5' 9.5" (1.765 m), weight 265 lb 4.8 oz (120.3 kg). Patient is alert and in no acute distress. Conjunctiva is pink. Sclera is nonicteric Oropharyngeal mucosa is normal. No neck masses or thyromegaly noted. Cardiac exam with regular rhythm normal S1 and S2. No murmur or gallop noted. Lungs are clear to auscultation. Abdomen is full.  On palpation is soft.  She has mild generalized tenderness no organomegaly or masses. No LE edema or clubbing noted.   Assessment:  #1.  Abdominal pain.  She has upper and lower abdominal pain.  Some of her pain may be due to IBS and/or dyspepsia.  Some of her pain appears to be superficial pain.  Will try her on antispasmodic.  #2.  GERD.  She is doing well with therapy.  #3.  Constipation.  Once again she is doing better with dietary measures and polyethylene glycol but she needs to take it on schedule rather than as needed and if she goes more than 2 days without a bowel movement she should use a  Dulcolax suppository so that she would not end up with fecal impaction.  #4.  Oropharyngeal dysphagia.  She underwent esophageal dilation recently.  She is not having esophageal dysphagia anymore.  She will benefit from evaluation by speech pathologist.   Plan:  Hyoscyamine sublingual 1 4 times daily as needed Consultation with [facility] for oropharyngeal dysphagia. Patient advised to take polyethylene glycol on schedule which could be daily or every other day. Dulcolax suppository every third day on as-needed basis.  Office visit in 6 months.

## 2018-02-28 NOTE — Patient Instructions (Addendum)
Consultation with speech pathologist to be arranged. Take polyethylene glycol on schedule with its daily or every other day.  Can titrate dose.

## 2018-03-07 ENCOUNTER — Other Ambulatory Visit (HOSPITAL_COMMUNITY): Payer: Self-pay | Admitting: Specialist

## 2018-03-07 DIAGNOSIS — R1312 Dysphagia, oropharyngeal phase: Secondary | ICD-10-CM

## 2018-03-09 ENCOUNTER — Telehealth (HOSPITAL_COMMUNITY): Payer: Self-pay | Admitting: Speech Pathology

## 2018-03-09 ENCOUNTER — Ambulatory Visit (HOSPITAL_COMMUNITY): Admission: RE | Admit: 2018-03-09 | Payer: Medicaid Other | Source: Ambulatory Visit

## 2018-03-09 ENCOUNTER — Ambulatory Visit (HOSPITAL_COMMUNITY): Payer: Medicaid Other | Admitting: Speech Pathology

## 2018-03-09 NOTE — Telephone Encounter (Signed)
Pt was in the ED last night she is sick and will call back to r/s

## 2018-03-09 NOTE — Telephone Encounter (Signed)
Patient called to say she have bronchitis and may not come today she will call us back at 11:00 to let us know

## 2018-03-29 ENCOUNTER — Ambulatory Visit (INDEPENDENT_AMBULATORY_CARE_PROVIDER_SITE_OTHER): Payer: Medicaid Other | Admitting: Cardiovascular Disease

## 2018-03-29 ENCOUNTER — Encounter: Payer: Self-pay | Admitting: Cardiovascular Disease

## 2018-03-29 VITALS — BP 128/84 | HR 79 | Ht 69.5 in | Wt 270.0 lb

## 2018-03-29 DIAGNOSIS — I10 Essential (primary) hypertension: Secondary | ICD-10-CM | POA: Diagnosis not present

## 2018-03-29 DIAGNOSIS — E785 Hyperlipidemia, unspecified: Secondary | ICD-10-CM | POA: Diagnosis not present

## 2018-03-29 DIAGNOSIS — I25118 Atherosclerotic heart disease of native coronary artery with other forms of angina pectoris: Secondary | ICD-10-CM

## 2018-03-29 DIAGNOSIS — Z955 Presence of coronary angioplasty implant and graft: Secondary | ICD-10-CM | POA: Diagnosis not present

## 2018-03-29 NOTE — Patient Instructions (Signed)

## 2018-03-29 NOTE — Progress Notes (Signed)
SUBJECTIVE: The patient presents for routine follow-up.  Her most recent cardiac catheterization was performed on 10/26/2012 and showed a patent RCA stent, 30% stenosis in the distal LAD, and 40% stenosis in the distal left circumflex coronary artery. Her RCA stent was placed on 09/18/2012.   She also has a history of hyperlipidemia, COPD, depression, fibromyalgia, GERD, hypertension, hypothyroidism, diabetes mellitus, obesity, and sleep apnea. She is a former smoker and quit in October 2012.  Echocardiogram on 08/25/2017 showed normal left ventricular systolic and diastolic function with normal regional wall motion, LVEF 60 to 65%.  Her lipids are markedly elevated (10/26/2017: Total cholesterol 268, HDL 57, triglycerides 158, LDL 181) and I prescribed Repatha.  Nuclear stress test on 08/29/2017 was low risk with no large ischemic territories noted.  The patient denies any symptoms of chest pain, palpitations, lightheadedness, dizziness, leg swelling, orthopnea, PND, and syncope.  She has chronic exertional dyspnea which is stable.  She recently got over bronchitis and has a mild residual cough.   Review of Systems: As per "subjective", otherwise negative.  Allergies  Allergen Reactions  . Ciprofloxacin Itching and Nausea And Vomiting  . Sulfonamide Derivatives Hives  . Bee Venom Swelling and Rash  . Pravastatin Rash    "swelling in legs/arms, bumps all over"     Current Outpatient Medications  Medication Sig Dispense Refill  . albuterol (PROAIR HFA) 108 (90 BASE) MCG/ACT inhaler Inhale 2 puffs into the lungs every 6 (six) hours as needed for wheezing or shortness of breath.    Marland Kitchen aspirin EC 81 MG tablet Take 81 mg by mouth every morning.     . Biotin 300 MCG TABS Take 300 mcg by mouth every morning.     . cromolyn (OPTICROM) 4 % ophthalmic solution Place into both eyes 2 (two) times daily.    . cycloSPORINE (RESTASIS) 0.05 % ophthalmic emulsion Place 1 drop into both  eyes 2 (two) times daily.    Marland Kitchen diltiazem 2 % GEL Apply 1 application topically 2 (two) times daily. 30 g 2  . esomeprazole (NEXIUM) 40 MG capsule Take 1 capsule (40 mg total) by mouth daily before breakfast. 30 capsule 5  . Evolocumab (REPATHA SURECLICK) 782 MG/ML SOAJ Inject 140 mg into the skin every 14 (fourteen) days. 2 pen 12  . hyoscyamine (LEVSIN SL) 0.125 MG SL tablet Place 1 tablet (0.125 mg total) under the tongue every 6 (six) hours as needed. 60 tablet 0  . insulin aspart (NOVOLOG) 100 UNIT/ML injection Inject 3-6 Units into the skin 3 (three) times daily as needed for high blood sugar. Inject 3 units if blood sugar is 150 -200, inject 6 units if over 200    . INVOKANA 100 MG TABS tablet TAKE 1 TABLET BY MOUTH EVERY DAY (Patient taking differently: TAKE 100MG  BY MOUTH ONCE DAILY IN THE MORNING) 90 tablet 0  . isosorbide mononitrate (IMDUR) 30 MG 24 hr tablet Take 30 mg by mouth daily.    Marland Kitchen levothyroxine (SYNTHROID, LEVOTHROID) 88 MCG tablet Take 88 mcg by mouth daily before breakfast.    . loratadine (CLARITIN) 10 MG tablet Take 10 mg by mouth daily.    . metFORMIN (GLUCOPHAGE) 500 MG tablet Take 500 mg by mouth 2 (two) times daily with a meal.    . methocarbamol (ROBAXIN) 500 MG tablet Take 500 mg by mouth every 8 (eight) hours as needed for muscle spasms.   1  . metoprolol (LOPRESSOR) 50 MG tablet Take 50 mg  by mouth 2 (two) times daily.    . nitroGLYCERIN (NITROSTAT) 0.4 MG SL tablet Place 1 tablet (0.4 mg total) under the tongue every 5 (five) minutes as needed for chest pain. 25 tablet 3  . polyethylene glycol powder (GLYCOLAX/MIRALAX) powder Take 17 g by mouth daily as needed for moderate constipation.   1  . prednisoLONE acetate (PREDNISOLONE ACETATE P-F) 1 % ophthalmic suspension Place 1 drop into both eyes as needed.    . pregabalin (LYRICA) 75 MG capsule Take 75 mg by mouth at bedtime.     Marland Kitchen tiotropium (SPIRIVA HANDIHALER) 18 MCG inhalation capsule Place 1 capsule (18 mcg  total) into inhaler and inhale daily. (Patient taking differently: Place 18 mcg into inhaler and inhale daily as needed (for shortness of breath). ) 30 capsule 6  . topiramate (TOPAMAX) 100 MG tablet Take 100 mg by mouth 2 (two) times daily.   0   No current facility-administered medications for this visit.     Past Medical History:  Diagnosis Date  . Asthma   . Back pain, chronic   . Cirrhosis of liver ([place])   . COPD (chronic obstructive pulmonary disease) ([place])   . Coronary atherosclerosis of native coronary artery    Prior stent RCA - patent May 2014 with otherwise nonobstructive disease  . Epigastric abdominal pain 08/19/2010  . Essential hypertension, benign   . Fibromyalgia   . OCD (obsessive compulsive disorder)   . Rectal bleed 11/02/2010  . Sciatica   . Sleep apnea   . Type 2 diabetes mellitus ([place])     Past Surgical History:  Procedure Laterality Date  . ABDOMINAL HYSTERECTOMY    . CHOLECYSTECTOMY    . COLONOSCOPY  11/30/2010  . COLONOSCOPY W/ BIOPSIES  12/24/2010   NUR  . COLONOSCOPY WITH PROPOFOL N/A 01/20/2018   Procedure: COLONOSCOPY WITH PROPOFOL;  Surgeon: Rogene Houston, MD;  Location: [facility];  Service: Endoscopy;  Laterality: N/A;  12:40  . COLONOSCOPY, ESOPHAGOGASTRODUODENOSCOPY (EGD) AND ESOPHAGEAL DILATION N/A 04/05/2013   Procedure: COLONOSCOPY, ESOPHAGOGASTRODUODENOSCOPY (EGD) AND ESOPHAGEAL DILATION;  Surgeon: Rogene Houston, MD;  Location: [facility];  Service: Endoscopy;  Laterality: N/A;  . CORONARY ANGIOPLASTY     stent 5 years ago  . ESOPHAGEAL DILATION N/A 08/14/2014   Procedure: ESOPHAGEAL DILATION;  Surgeon: Rogene Houston, MD;  [facility]: [facility];  Service: Endoscopy;  Laterality: N/A;  . ESOPHAGEAL DILATION N/A 01/20/2018   Procedure: ESOPHAGEAL DILATION;  Surgeon: Rogene Houston, MD;  [facility]: [facility];  Service: Endoscopy;  Laterality: N/A;  . ESOPHAGOGASTRODUODENOSCOPY  03/22/2012   Procedure:  ESOPHAGOGASTRODUODENOSCOPY (EGD);  Surgeon: Rogene Houston, MD;  [facility]: [facility];  Service: Endoscopy;  Laterality: N/A;  300  . ESOPHAGOGASTRODUODENOSCOPY N/A 08/14/2014   Procedure: ESOPHAGOGASTRODUODENOSCOPY (EGD);  Surgeon: Rogene Houston, MD;  [facility]: [facility];  Service: Endoscopy;  Laterality: N/A;  225  . ESOPHAGOGASTRODUODENOSCOPY (EGD) WITH PROPOFOL N/A 01/20/2018   Procedure: ESOPHAGOGASTRODUODENOSCOPY (EGD) WITH PROPOFOL;  Surgeon: Rogene Houston, MD;  [facility]: [facility];  Service: Endoscopy;  Laterality: N/A;    Social History   Socioeconomic History  . Marital status: Divorced    Spouse name: Not on file  . Number of children: 3  . Years of education: GED  . Highest education level: Not on file  Occupational History  . Not on file  Social Needs  . Financial resource strain: Not on file  . Food insecurity:  Worry: Not on file    Inability: Not on file  . Transportation needs:    Medical: Not on file    Non-medical: Not on file  Tobacco Use  . Smoking status: Former Smoker    Packs/day: 0.50    Years: 20.00    Pack years: 10.00    Types: Cigarettes    Start date: 08/16/1977    Last attempt to quit: 09/21/2011    Years since quitting: 6.5  . Smokeless tobacco: Never Used  . Tobacco comment: Quit 36yr ago after smoking 12 yrs  Substance and Sexual Activity  . Alcohol use: Never    Alcohol/week: 0.0 standard drinks    Frequency: Never  . Drug use: No  . Sexual activity: Not on file  Lifestyle  . Physical activity:    Days per week: Not on file    Minutes per session: Not on file  . Stress: Not on file  Relationships  . Social connections:    Talks on phone: Not on file    Gets together: Not on file    Attends religious service: Not on file    Active member of club or organization: Not on file    Attends meetings of clubs or organizations: Not on file    Relationship status: Not on file  . Intimate partner violence:    Fear of current  or ex partner: Not on file    Emotionally abused: Not on file    Physically abused: Not on file    Forced sexual activity: Not on file  Other Topics Concern  . Not on file  Social History Narrative   Lives at home by herself   Her daughter manages her medications as much as she can   Right handed   Drinks 1-2 cups caffeine daily        Vitals:   03/29/18 1144  BP: 128/84  Pulse: 79  SpO2: 98%  Weight: 270 lb (122.5 kg)  Height: 5' 9.5" (1.765 m)    Wt Readings from Last 3 Encounters:  03/29/18 270 lb (122.5 kg)  02/28/18 265 lb 4.8 oz (120.3 kg)  01/11/18 265 lb (120.2 kg)     PHYSICAL EXAM General: NAD HEENT: Normal. Neck: No JVD, no thyromegaly. Lungs: Clear to auscultation bilaterally with normal respiratory effort. CV: Regular rate and rhythm, normal S1/S2, no S3/S4, no murmur. No pretibial or periankle edema.  No carotid bruit.   Abdomen: Soft, nontender, no distention.  Neurologic: Alert and oriented.  Psych: Normal affect. Skin: Normal. Musculoskeletal: No gross deformities.    ECG: Reviewed above under Subjective   Labs: Lab Results  Component Value Date/Time   K 4.1 01/11/2018 04:28 PM   BUN 19 01/11/2018 04:28 PM   BUN 12 04/11/2017 02:28 PM   CREATININE 0.87 01/11/2018 04:28 PM   CREATININE 0.82 12/21/2017 01:15 PM   ALT 57 (H) 01/11/2018 04:46 PM   TSH 3.46 08/27/2016 10:59 AM   HGB 15.1 (H) 01/11/2018 04:28 PM     Lipids: Lab Results  Component Value Date/Time   LDLCALC 181 (H) 10/26/2017 09:16 AM   CHOL 268 (H) 10/26/2017 09:16 AM   TRIG 158 (H) 10/26/2017 09:16 AM   HDL 57 10/26/2017 09:16 AM       ASSESSMENT AND PLAN:  1. CAD with RCA stent:  Symptomatically improved.  Nuclear stress test was low risk in March 2019. I will continue aspirin, Imdurand metoprolol. She is not able to tolerate statin therapy reportedly due  to liver dysfunction. She is now on Repatha.  2. Essential HTN: Blood pressure is normal today.    No  changes to therapy.  3. Hyperlipidemia: She is not able to tolerate statin therapy reportedly due to liver dysfunction.She had beenon Zetia.  She is now on Repatha.  Lipids are to be repeated.   Disposition: Follow up 6 months   Kate Sable, M.D., F.A.C.C.

## 2018-04-26 ENCOUNTER — Other Ambulatory Visit (INDEPENDENT_AMBULATORY_CARE_PROVIDER_SITE_OTHER): Payer: Self-pay | Admitting: Internal Medicine

## 2018-05-31 ENCOUNTER — Ambulatory Visit: Payer: Medicaid Other | Admitting: "Endocrinology

## 2018-06-20 ENCOUNTER — Other Ambulatory Visit: Payer: Self-pay | Admitting: Cardiovascular Disease

## 2018-06-20 MED ORDER — ISOSORBIDE MONONITRATE ER 30 MG PO TB24
30.0000 mg | ORAL_TABLET | Freq: Every day | ORAL | 1 refills | Status: DC
Start: 2018-06-20 — End: 2019-01-01

## 2018-06-20 MED ORDER — METOPROLOL TARTRATE 50 MG PO TABS: 50.0000 mg | ORAL_TABLET | Freq: Two times a day (BID) | ORAL | 1 refills | Status: DC

## 2018-06-20 NOTE — Telephone Encounter (Signed)
ALL Cardiac Meds  Please send to [facility]  - patient is switching pharmacies

## 2018-06-20 NOTE — Telephone Encounter (Signed)
Medication sent to pharmacy  

## 2018-06-26 ENCOUNTER — Telehealth: Payer: Self-pay

## 2018-06-26 DIAGNOSIS — I10 Essential (primary) hypertension: Secondary | ICD-10-CM

## 2018-06-26 DIAGNOSIS — E1165 Type 2 diabetes mellitus with hyperglycemia: Secondary | ICD-10-CM

## 2018-06-26 DIAGNOSIS — E118 Type 2 diabetes mellitus with unspecified complications: Principal | ICD-10-CM

## 2018-06-26 DIAGNOSIS — IMO0002 Reserved for concepts with insufficient information to code with codable children: Secondary | ICD-10-CM

## 2018-06-26 DIAGNOSIS — E782 Mixed hyperlipidemia: Secondary | ICD-10-CM

## 2018-06-26 NOTE — Telephone Encounter (Signed)
SIGNED

## 2018-06-28 LAB — COMPREHENSIVE METABOLIC PANEL
AG RATIO: 1.5 (calc) (ref 1.0–2.5)
ALKALINE PHOSPHATASE (APISO): 89 U/L (ref 33–130)
ALT: 58 U/L — ABNORMAL HIGH (ref 6–29)
AST: 50 U/L — ABNORMAL HIGH (ref 10–35)
Albumin: 4.2 g/dL (ref 3.6–5.1)
BILIRUBIN TOTAL: 0.8 mg/dL (ref 0.2–1.2)
BUN: 14 mg/dL (ref 7–25)
CALCIUM: 9.5 mg/dL (ref 8.6–10.4)
CO2: 19 mmol/L — AB (ref 20–32)
Chloride: 108 mmol/L (ref 98–110)
Creat: 0.77 mg/dL (ref 0.50–1.05)
Globulin: 2.8 g/dL (calc) (ref 1.9–3.7)
Glucose, Bld: 120 mg/dL (ref 65–139)
POTASSIUM: 4.4 mmol/L (ref 3.5–5.3)
Sodium: 139 mmol/L (ref 135–146)
Total Protein: 7 g/dL (ref 6.1–8.1)

## 2018-06-28 LAB — HEMOGLOBIN A1C
HEMOGLOBIN A1C: 6.2 %{Hb} — AB (ref <5.7)
Mean Plasma Glucose: 131 (calc)
eAG (mmol/L): 7.3 (calc)

## 2018-07-04 ENCOUNTER — Telehealth: Payer: Self-pay

## 2018-07-04 ENCOUNTER — Ambulatory Visit (INDEPENDENT_AMBULATORY_CARE_PROVIDER_SITE_OTHER): Payer: Medicaid Other | Admitting: "Endocrinology

## 2018-07-04 ENCOUNTER — Encounter: Payer: Self-pay | Admitting: "Endocrinology

## 2018-07-04 VITALS — Ht 69.5 in | Wt 265.0 lb

## 2018-07-04 DIAGNOSIS — E782 Mixed hyperlipidemia: Secondary | ICD-10-CM | POA: Diagnosis not present

## 2018-07-04 DIAGNOSIS — E1165 Type 2 diabetes mellitus with hyperglycemia: Secondary | ICD-10-CM

## 2018-07-04 DIAGNOSIS — I10 Essential (primary) hypertension: Secondary | ICD-10-CM

## 2018-07-04 DIAGNOSIS — IMO0002 Reserved for concepts with insufficient information to code with codable children: Secondary | ICD-10-CM

## 2018-07-04 DIAGNOSIS — E038 Other specified hypothyroidism: Secondary | ICD-10-CM

## 2018-07-04 DIAGNOSIS — E118 Type 2 diabetes mellitus with unspecified complications: Secondary | ICD-10-CM | POA: Diagnosis not present

## 2018-07-04 HISTORY — DX: Reserved for concepts with insufficient information to code with codable children: IMO0002

## 2018-07-04 HISTORY — DX: Type 2 diabetes mellitus with hyperglycemia: E11.65

## 2018-07-04 NOTE — Progress Notes (Signed)
Endocrinology follow-up note  Subjective:    Patient ID: Amber Fischer, female    DOB: 1960/11/02. Patient is being seen in Follow-up for management of diabetes , hypothyroidism requested by  Neale Burly, MD  Past Medical History:  Diagnosis Date  . Asthma   . Back pain, chronic   . Cirrhosis of liver ([place])   . COPD (chronic obstructive pulmonary disease) ([place])   . Coronary atherosclerosis of native coronary artery    Prior stent RCA - patent May 2014 with otherwise nonobstructive disease  . Epigastric abdominal pain 08/19/2010  . Essential hypertension, benign   . Fibromyalgia   . OCD (obsessive compulsive disorder)   . Rectal bleed 11/02/2010  . Sciatica   . Sleep apnea   . Type 2 diabetes mellitus ([place])    Past Surgical History:  Procedure Laterality Date  . ABDOMINAL HYSTERECTOMY    . CHOLECYSTECTOMY    . COLONOSCOPY  11/30/2010  . COLONOSCOPY W/ BIOPSIES  12/24/2010   NUR  . COLONOSCOPY WITH PROPOFOL N/A 01/20/2018   Procedure: COLONOSCOPY WITH PROPOFOL;  Surgeon: Rogene Houston, MD;  [facility];  Service: Endoscopy;  Laterality: N/A;  12:40  . COLONOSCOPY, ESOPHAGOGASTRODUODENOSCOPY (EGD) AND ESOPHAGEAL DILATION N/A 04/05/2013   Procedure: COLONOSCOPY, ESOPHAGOGASTRODUODENOSCOPY (EGD) AND ESOPHAGEAL DILATION;  Surgeon: Rogene Houston, MD;  [facility];  Service: Endoscopy;  Laterality: N/A;  . CORONARY ANGIOPLASTY     stent 5 years ago  . ESOPHAGEAL DILATION N/A 08/14/2014   Procedure: ESOPHAGEAL DILATION;  Surgeon: Rogene Houston, MD;  [facility];  Service: Endoscopy;  Laterality: N/A;  . ESOPHAGEAL DILATION N/A 01/20/2018   Procedure: ESOPHAGEAL DILATION;  Surgeon: Rogene Houston, MD;  [facility];  Service: Endoscopy;  Laterality: N/A;  . ESOPHAGOGASTRODUODENOSCOPY  03/22/2012   Procedure: ESOPHAGOGASTRODUODENOSCOPY (EGD);  Surgeon: Rogene Houston, MD;  [facility];  Service: Endoscopy;  Laterality:  N/A;  300  . ESOPHAGOGASTRODUODENOSCOPY N/A 08/14/2014   Procedure: ESOPHAGOGASTRODUODENOSCOPY (EGD);  Surgeon: Rogene Houston, MD;  [facility];  Service: Endoscopy;  Laterality: N/A;  225  . ESOPHAGOGASTRODUODENOSCOPY (EGD) WITH PROPOFOL N/A 01/20/2018   Procedure: ESOPHAGOGASTRODUODENOSCOPY (EGD) WITH PROPOFOL;  Surgeon: Rogene Houston, MD;  [facility];  Service: Endoscopy;  Laterality: N/A;   Social History   Socioeconomic History  . Marital status: Divorced    Spouse name: Not on file  . Number of children: 3  . Years of education: GED  . Highest education level: Not on file  Occupational History  . Not on file  Social Needs  . Financial resource strain: Not on file  . Food insecurity:    Worry: Not on file    Inability: Not on file  . Transportation needs:    Medical: Not on file    Non-medical: Not on file  Tobacco Use  . Smoking status: Former Smoker    Packs/day: 0.50    Years: 20.00    Pack years: 10.00    Types: Cigarettes    Start date: 08/16/1977    Last attempt to quit: 09/21/2011    Years since quitting: 6.7  . Smokeless tobacco: Never Used  . Tobacco comment: Quit 48yr ago after smoking 12 yrs  Substance and Sexual Activity  . Alcohol use: Never    Alcohol/week: 0.0 standard drinks    Frequency: Never  . Drug use: No  . Sexual activity: Not on file  Lifestyle  .  Physical activity:    Days per week: Not on file    Minutes per session: Not on file  . Stress: Not on file  Relationships  . Social connections:    Talks on phone: Not on file    Gets together: Not on file    Attends religious service: Not on file    Active member of club or organization: Not on file    Attends meetings of clubs or organizations: Not on file    Relationship status: Not on file  Other Topics Concern  . Not on file  Social History Narrative   Lives at home by herself   Her daughter manages her medications as much as she can   Right handed   Drinks  1-2 cups caffeine daily      Outpatient Encounter Medications as of 07/04/2018  Medication Sig  . albuterol (PROAIR HFA) 108 (90 BASE) MCG/ACT inhaler Inhale 2 puffs into the lungs every 6 (six) hours as needed for wheezing or shortness of breath.  Marland Kitchen aspirin EC 81 MG tablet Take 81 mg by mouth every morning.   . Biotin 300 MCG TABS Take 300 mcg by mouth every morning.   Marland Kitchen esomeprazole (NEXIUM) 40 MG capsule Take 1 capsule (40 mg total) by mouth daily before breakfast.  . Evolocumab (REPATHA SURECLICK) 785 MG/ML SOAJ Inject 140 mg into the skin every 14 (fourteen) days.  Marland Kitchen HYDROcodone-acetaminophen (NORCO) 10-325 MG tablet Take 1 tablet by mouth 3 (three) times daily as needed.  . isosorbide mononitrate (IMDUR) 30 MG 24 hr tablet Take 1 tablet (30 mg total) by mouth daily.  Marland Kitchen levothyroxine (SYNTHROID, LEVOTHROID) 125 MCG tablet Take 125 mcg by mouth daily before breakfast.   . loratadine (CLARITIN) 10 MG tablet Take 10 mg by mouth daily.  . metFORMIN (GLUCOPHAGE) 1000 MG tablet Take 1,000 mg by mouth 2 (two) times daily with a meal.   . methocarbamol (ROBAXIN) 500 MG tablet Take 500 mg by mouth every 8 (eight) hours as needed for muscle spasms.   . metoprolol tartrate (LOPRESSOR) 50 MG tablet Take 1 tablet (50 mg total) by mouth 2 (two) times daily.  . polyethylene glycol powder (GLYCOLAX/MIRALAX) powder Take 17 g by mouth daily as needed for moderate constipation.   Marland Kitchen tiotropium (SPIRIVA HANDIHALER) 18 MCG inhalation capsule Place 1 capsule (18 mcg total) into inhaler and inhale daily. (Patient taking differently: Place 18 mcg into inhaler and inhale daily as needed (for shortness of breath). )  . topiramate (TOPAMAX) 100 MG tablet Take 100 mg by mouth 2 (two) times daily.   . [DISCONTINUED] insulin aspart (NOVOLOG) 100 UNIT/ML injection Inject 3-6 Units into the skin 3 (three) times daily as needed for high blood sugar. Inject 3 units if blood sugar is 150 -200, inject 6 units if over 200  .  [DISCONTINUED] INVOKANA 100 MG TABS tablet TAKE 1 TABLET BY MOUTH EVERY DAY (Patient taking differently: TAKE 100MG  BY MOUTH ONCE DAILY IN THE MORNING)  . nitroGLYCERIN (NITROSTAT) 0.4 MG SL tablet Place 1 tablet (0.4 mg total) under the tongue every 5 (five) minutes as needed for chest pain.  . [DISCONTINUED] cromolyn (OPTICROM) 4 % ophthalmic solution Place into both eyes 2 (two) times daily.  . [DISCONTINUED] cycloSPORINE (RESTASIS) 0.05 % ophthalmic emulsion Place 1 drop into both eyes 2 (two) times daily.  . [DISCONTINUED] diltiazem 2 % GEL Apply 1 application topically 2 (two) times daily.  . [DISCONTINUED] hyoscyamine (LEVSIN SL) 0.125 MG SL tablet PLACE  1 TABLET (0.125 MG TOTAL) UNDER THE TONGUE EVERY 6 (SIX) HOURS AS NEEDED.  . [DISCONTINUED] prednisoLONE acetate (PREDNISOLONE ACETATE P-F) 1 % ophthalmic suspension Place 1 drop into both eyes as needed.  . [DISCONTINUED] pregabalin (LYRICA) 75 MG capsule Take 75 mg by mouth at bedtime.    No facility-administered encounter medications on file as of 07/04/2018.    ALLERGIES: Allergies  Allergen Reactions  . Ciprofloxacin Itching and Nausea And Vomiting  . Sulfonamide Derivatives Hives  . Bee Venom Swelling and Rash  . Pravastatin Rash    "swelling in legs/arms, bumps all over"    VACCINATION STATUS: Immunization History  Administered Date(s) Administered  . Influenza,inj,Quad PF,6+ Mos 03/20/2014, 09/19/2014    Diabetes  She presents for her follow-up diabetic visit. She has type 2 diabetes mellitus. Onset time: She was diagnosed at approximate age of 63 years. Her disease course has been stable. There are no hypoglycemic associated symptoms. Pertinent negatives for hypoglycemia include no confusion, headaches, pallor or seizures. There are no diabetic associated symptoms. Pertinent negatives for diabetes include no chest pain, no polydipsia, no polyphagia and no polyuria. There are no hypoglycemic complications. Symptoms are  improving. Risk factors for coronary artery disease include dyslipidemia, diabetes mellitus, hypertension, family history, obesity, sedentary lifestyle and tobacco exposure. Current diabetic treatment includes oral agent (dual therapy). She is compliant with treatment most of the time. Her weight is fluctuating minimally. She is following a diabetic diet. When asked about meal planning, she reported none. She participates in exercise intermittently. (She is returning after missing her appointments since March 2018.  Her previsit labs show A1c still on target at 6.2%.) An ACE inhibitor/angiotensin II receptor blocker is being taken. Eye exam is current.  Hyperlipidemia  This is a chronic problem. The current episode started more than 1 year ago. Exacerbating diseases include diabetes, hypothyroidism and obesity. Pertinent negatives include no chest pain, myalgias or shortness of breath. Current antihyperlipidemic treatment includes statins. Risk factors for coronary artery disease include diabetes mellitus, dyslipidemia, hypertension, obesity and a sedentary lifestyle.  Hypertension  This is a chronic problem. The current episode started more than 1 year ago. Pertinent negatives include no chest pain, headaches, palpitations or shortness of breath. Risk factors for coronary artery disease include dyslipidemia, diabetes mellitus, obesity and sedentary lifestyle. Past treatments include ACE inhibitors and beta blockers. The current treatment provides moderate improvement.    Review of Systems  Constitutional: Negative for chills, fever and unexpected weight change.  HENT: Negative for trouble swallowing and voice change.   Eyes: Negative for visual disturbance.  Respiratory: Negative for cough, shortness of breath and wheezing.   Cardiovascular: Negative for chest pain, palpitations and leg swelling.  Gastrointestinal: Negative for diarrhea, nausea and vomiting.  Endocrine: Negative for cold intolerance,  heat intolerance, polydipsia, polyphagia and polyuria.  Musculoskeletal: Negative for arthralgias and myalgias.  Skin: Negative for color change, pallor, rash and wound.  Neurological: Negative for seizures and headaches.  Psychiatric/Behavioral: Negative for confusion and suicidal ideas.    Objective:    Ht 5' 9.5" (1.765 m)   Wt 265 lb (120.2 kg)   BMI 38.57 kg/m   Wt Readings from Last 3 Encounters:  07/04/18 265 lb (120.2 kg)  03/29/18 270 lb (122.5 kg)  02/28/18 265 lb 4.8 oz (120.3 kg)    Physical Exam  Constitutional: She is oriented to person, place, and time. She appears well-developed.  HENT:  Head: Normocephalic and atraumatic.  Eyes: EOM are normal.  Neck: Normal  range of motion. Neck supple. No tracheal deviation present. No thyromegaly present.  Cardiovascular: Normal rate and regular rhythm.  Pulmonary/Chest: Effort normal.  Abdominal: There is no abdominal tenderness. There is no guarding.  Musculoskeletal: Normal range of motion.        General: No edema.  Neurological: She is alert and oriented to person, place, and time. She has normal reflexes. No cranial nerve deficit. Coordination normal.  Skin: Skin is warm and dry. No rash noted. No erythema. No pallor.  Psychiatric: She has a normal mood and affect. Judgment normal.    Recent Results (from the past 2160 hour(s))  Hemoglobin A1c     Status: Abnormal   Collection Time: 06/27/18  1:30 PM  Result Value Ref Range   Hgb A1c MFr Bld 6.2 (H) <5.7 % of total Hgb    Comment: For someone without known diabetes, a hemoglobin  A1c value between 5.7% and 6.4% is consistent with prediabetes and should be confirmed with a  follow-up test. . For someone with known diabetes, a value <7% indicates that their diabetes is well controlled. A1c targets should be individualized based on duration of diabetes, age, comorbid conditions, and other considerations. . This assay result is consistent with an increased  risk of diabetes. . Currently, no consensus exists regarding use of hemoglobin A1c for diagnosis of diabetes for children. .    Mean Plasma Glucose 131 (calc)   eAG (mmol/L) 7.3 (calc)  Comprehensive metabolic panel     Status: Abnormal   Collection Time: 06/27/18  1:30 PM  Result Value Ref Range   Glucose, Bld 120 65 - 139 mg/dL    Comment: .        Non-fasting reference interval .    BUN 14 7 - 25 mg/dL   Creat 0.77 0.50 - 1.05 mg/dL    Comment: For patients >38 years of age, the reference limit for Creatinine is approximately 13% higher for people identified as African-American. .    BUN/Creatinine Ratio NOT APPLICABLE 6 - 22 (calc)   Sodium 139 135 - 146 mmol/L   Potassium 4.4 3.5 - 5.3 mmol/L   Chloride 108 98 - 110 mmol/L   CO2 19 (L) 20 - 32 mmol/L   Calcium 9.5 8.6 - 10.4 mg/dL   Total Protein 7.0 6.1 - 8.1 g/dL   Albumin 4.2 3.6 - 5.1 g/dL   Globulin 2.8 1.9 - 3.7 g/dL (calc)   AG Ratio 1.5 1.0 - 2.5 (calc)   Total Bilirubin 0.8 0.2 - 1.2 mg/dL   Alkaline phosphatase (APISO) 89 33 - 130 U/L   AST 50 (H) 10 - 35 U/L   ALT 58 (H) 6 - 29 U/L    Lipid Panel     Component Value Date/Time   CHOL 268 (H) 10/26/2017 0916   TRIG 158 (H) 10/26/2017 0916   HDL 57 10/26/2017 0916   CHOLHDL 4.7 10/26/2017 0916   VLDL 30 05/25/2016 1521   LDLCALC 181 (H) 10/26/2017 0916     Assessment & Plan:   1. Uncontrolled type 2 diabetes mellitus with complication, without long-term current use of insulin ([place])  - Patient has currently controlled symptomatic type 2 DM since  58 years of age. -After she has missed her appointment since March 2018, returns with A1c of 6.2%.   - Recent labs reviewed.  -  patient remains at a high risk for more acute and chronic complications of diabetes which include CAD, CVA, CKD, retinopathy, and neuropathy. These  are all discussed in detail with the patient.  - I have counseled the patient on diet management and weight loss, by adopting a  carbohydrate restricted/protein rich diet.  -  Suggestion is made for her to avoid simple carbohydrates  from her diet including Cakes, Sweet Desserts / Pastries, Ice Cream, Soda (diet and regular), Sweet Tea, Candies, Chips, Cookies, Store Bought Juices, Alcohol in Excess of  1-2 drinks a day, Artificial Sweeteners, and "Sugar-free" Products. This will help patient to have stable blood glucose profile and potentially avoid unintended weight gain.   - I encouraged the patient to switch to  unprocessed or minimally processed complex starch and increased protein intake (animal or plant source), fruits, and vegetables.  - Patient is advised to stick to a routine mealtimes to eat 3 meals  a day and avoid unnecessary snacks ( to snack only to correct hypoglycemia).  - The patient will be scheduled with Jearld Fenton, RDN, CDE for individualized DM education.  - I have approached patient with the following individualized plan to manage diabetes and patient agrees:   - Based on her presentation with tightly controlled diabetes type 2, she is advised to discontinue insulin treatment which was initiated by her PMD in the interval.  -She is also advised to discontinue Invokana.  She is advised to continue only on metformin 1000 mg p.o. twice daily-after breakfast and after supper.   -She insists that she needs insulin treatment, however advised her to avoid insulin.  She is advised to monitor blood glucose at fasting and report if readings are above 200 mg/dL.  - Patient specific target  A1c;  LDL, HDL, Triglycerides, and  Waist Circumference were discussed in detail.  2) BP/HTN: Her blood pressure is controlled to target.  Is advised to continue her current blood pressure medications including current medications including metoprolol, Imdur. 3) Lipids/HPL: Her last lipid panel shows LDL of 181.  She is currently on Repatha treatment 140 mg subcutaneously every 2 weeks.    4)  Weight/Diet: CDE Consult  will be initiated , exercise, and detailed carbohydrates information provided.   5)  hypothyroidism: She does not have recent thyroid function tests to review.  She is advised to continue levothyroxine 125 g by mouth every morning.  - We discussed about correct intake of levothyroxine, at fasting, with water, separated by at least 30 minutes from breakfast, and separated by more than 4 hours from calcium, iron, multivitamins, acid reflux medications (PPIs). -Patient is made aware of the fact that thyroid hormone replacement is needed for life, dose to be adjusted by periodic monitoring of thyroid function tests.  6) Chronic Care/Health Maintenance:  -Patient  on ACEI/ARB and Statin medications and encouraged to continue to follow up with Ophthalmology, Podiatrist at least yearly or according to recommendations, and advised to   stay away from smoking. I have recommended yearly flu vaccine and pneumonia vaccination at least every 5 years; moderate intensity exercise for up to 150 minutes weekly; and  sleep for at least 7 hours a day.  - I advised patient to maintain close follow up with Neale Burly, MD for primary care needs.  - Time spent with the patient: 25 min, of which >50% was spent in reviewing her  current and  previous labs, previous treatments, and medications doses and developing a plan for long-term care.  [name] participated in the discussions, expressed understanding, and voiced agreement with the above plans.  All questions were answered to her  satisfaction. she is encouraged to contact clinic should she have any questions or concerns prior to her return visit.  Follow up plan: - Return in about 3 months (around 10/03/2018) for Follow up with Pre-visit Labs, Meter, and Logs.  Glade Lloyd, MD Phone: [phone]  Fax: [phone]   07/04/2018, 1:17 PM

## 2018-07-04 NOTE — Telephone Encounter (Signed)
Pt states that she has a yeast infection due to the invokana she was taking and is asking if we can call something in to [facility] drug

## 2018-07-04 NOTE — Patient Instructions (Signed)

## 2018-08-15 ENCOUNTER — Other Ambulatory Visit (HOSPITAL_COMMUNITY): Payer: Self-pay | Admitting: Neurology

## 2018-08-15 ENCOUNTER — Ambulatory Visit (HOSPITAL_COMMUNITY)
Admission: RE | Admit: 2018-08-15 | Discharge: 2018-08-15 | Disposition: A | Discharge status: Home / Self Care | Payer: Medicaid Other | Source: Ambulatory Visit | Attending: Neurology | Admitting: Neurology

## 2018-08-15 DIAGNOSIS — M5440 Lumbago with sciatica, unspecified side: Secondary | ICD-10-CM

## 2018-08-22 ENCOUNTER — Telehealth: Payer: Self-pay | Admitting: Cardiovascular Disease

## 2018-08-22 MED ORDER — EVOLOCUMAB 140 MG/ML ~~LOC~~ SOAJ: 140.0000 mg | SUBCUTANEOUS | 12 refills | Status: DC

## 2018-08-22 NOTE — Telephone Encounter (Signed)
Refill sent to pharmacy.   

## 2018-08-22 NOTE — Telephone Encounter (Signed)
°*  STAT* If patient is at the pharmacy, call can be transferred to refill team.   1. Which medications need to be refilled? Evolocumab (REPATHA SURECLICK) 029 MG/ML SOAJ    2. Which pharmacy/location (including street and city if local pharmacy) is medication to be sent to? [facility]  3. Do they need a 30 day or 90 day supply?

## 2018-08-29 ENCOUNTER — Encounter (INDEPENDENT_AMBULATORY_CARE_PROVIDER_SITE_OTHER): Payer: Self-pay | Admitting: Internal Medicine

## 2018-08-29 ENCOUNTER — Ambulatory Visit (INDEPENDENT_AMBULATORY_CARE_PROVIDER_SITE_OTHER): Payer: Medicaid Other | Admitting: Internal Medicine

## 2018-08-29 VITALS — BP 138/83 | HR 76 | Temp 97.9°F | Resp 18 | Ht 69.5 in | Wt 262.3 lb

## 2018-08-29 DIAGNOSIS — K760 Fatty (change of) liver, not elsewhere classified: Secondary | ICD-10-CM

## 2018-08-29 DIAGNOSIS — K219 Gastro-esophageal reflux disease without esophagitis: Secondary | ICD-10-CM

## 2018-08-29 MED ORDER — DEXLANSOPRAZOLE 60 MG PO CPDR: 60.0000 mg | DELAYED_RELEASE_CAPSULE | Freq: Every day | ORAL | 5 refills | Status: DC

## 2018-08-29 NOTE — Progress Notes (Signed)
Presenting complaint;  Follow-up for GERD and abdominal pain.  Database and subjective:  Patient is 58 year old Caucasian female who was evaluated last year for multiple symptoms including nausea vomiting and abdominal pain as well as dysphagia and rectal bleeding.  She underwent EGD as well as colonoscopy and a CT.  She was last seen in September 2019 and was doing well.  Patient states she continues to feel well.  She says she changed her commode to low-lying toilet around Thanksgiving.  Since then she has had no abdominal pain and she says she is having less back pain.  She is having 3-4 bowel movements per day.  Most of her stools are formed.  She has not had any more melena or rectal bleeding.  She says generic Nexium is not working she is having daily heartburn.  She has tried pantoprazole in the past and it did not work.  She takes pain pill 3 times a day for back pain.  She is not having to take much polyethylene glycol.    Current Medications: Outpatient Encounter Medications as of 08/29/2018  Medication Sig  . albuterol (PROAIR HFA) 108 (90 BASE) MCG/ACT inhaler Inhale 2 puffs into the lungs every 6 (six) hours as needed for wheezing or shortness of breath.  Marland Kitchen aspirin EC 81 MG tablet Take 81 mg by mouth every morning.   . Biotin 300 MCG TABS Take 300 mcg by mouth every morning.   Marland Kitchen esomeprazole (NEXIUM) 40 MG capsule Take 1 capsule (40 mg total) by mouth daily before breakfast.  . Evolocumab (REPATHA SURECLICK) 875 MG/ML SOAJ Inject 140 mg into the skin every 14 (fourteen) days.  Marland Kitchen HYDROcodone-acetaminophen (NORCO) 7.5-325 MG tablet Take 1 tablet by mouth 3 (three) times daily as needed.   . isosorbide mononitrate (IMDUR) 30 MG 24 hr tablet Take 1 tablet (30 mg total) by mouth daily.  Marland Kitchen levothyroxine (SYNTHROID, LEVOTHROID) 125 MCG tablet Take 125 mcg by mouth daily before breakfast.   . loratadine (CLARITIN) 10 MG tablet Take 10 mg by mouth daily.  . metFORMIN (GLUCOPHAGE) 1000 MG  tablet Take 1,000 mg by mouth 2 (two) times daily with a meal.   . methocarbamol (ROBAXIN) 500 MG tablet Take 500 mg by mouth every 8 (eight) hours as needed for muscle spasms.   . metoprolol tartrate (LOPRESSOR) 50 MG tablet Take 1 tablet (50 mg total) by mouth 2 (two) times daily.  . nitroGLYCERIN (NITROSTAT) 0.4 MG SL tablet Place 1 tablet (0.4 mg total) under the tongue every 5 (five) minutes as needed for chest pain.  Marland Kitchen tiotropium (SPIRIVA HANDIHALER) 18 MCG inhalation capsule Place 1 capsule (18 mcg total) into inhaler and inhale daily. (Patient taking differently: Place 18 mcg into inhaler and inhale daily as needed (for shortness of breath). )  . topiramate (TOPAMAX) 100 MG tablet Take 100 mg by mouth 2 (two) times daily.   . [DISCONTINUED] polyethylene glycol powder (GLYCOLAX/MIRALAX) powder Take 17 g by mouth daily as needed for moderate constipation.    No facility-administered encounter medications on file as of 08/29/2018.      Objective: Blood pressure 138/83, pulse 76, temperature 97.9 F (36.6 C), temperature source Oral, resp. rate 18, height 5' 9.5" (1.765 m), weight 262 lb 4.8 oz (119 kg). Patient is alert and in no acute distress. She has xanthelasma involving inner aspects of upper eyelids. Conjunctiva is pink. Sclera is nonicteric Oropharyngeal mucosa is normal. No neck masses or thyromegaly noted. Cardiac exam with regular rhythm normal S1  and S2. No murmur or gallop noted. Lungs are clear to auscultation. Abdomen is full but soft and nontender without hepatosplenomegaly or masses. No LE edema or clubbing noted.  Labs/studies Results:   CBC Latest Ref Rng & Units 01/11/2018 12/21/2017 09/19/2014  WBC 4.0 - 10.5 K/uL 7.9 5.3 5.1  Hemoglobin 12.0 - 15.0 g/dL 15.1(H) 14.0 11.6(L)  Hematocrit 36.0 - 46.0 % 44.6 41.6 35.5(L)  Platelets 150 - 400 K/uL 203 179 148(L)    CMP Latest Ref Rng & Units 06/27/2018 01/11/2018 12/21/2017  Glucose 65 - 139 mg/dL 120 125(H) 189(H)  BUN  7 - 25 mg/dL 14 19 20   Creatinine 0.50 - 1.05 mg/dL 0.77 0.87 0.82  Sodium 135 - 146 mmol/L 139 138 139  Potassium 3.5 - 5.3 mmol/L 4.4 4.1 3.8  Chloride 98 - 110 mmol/L 108 104 108  CO2 20 - 32 mmol/L 19(L) 23 21  Calcium 8.6 - 10.4 mg/dL 9.5 9.6 9.4  Total Protein 6.1 - 8.1 g/dL 7.0 7.8 6.9  Total Bilirubin 0.2 - 1.2 mg/dL 0.8 1.0 0.8  Alkaline Phos 38 - 126 U/L - 84 -  AST 10 - 35 U/L 50(H) 50(H) 45(H)  ALT 6 - 29 U/L 58(H) 57(H) 58(H)    Hepatic Function Latest Ref Rng & Units 06/27/2018 01/11/2018 12/21/2017  Total Protein 6.1 - 8.1 g/dL 7.0 7.8 6.9  Albumin 3.5 - 5.0 g/dL - 4.2 -  AST 10 - 35 U/L 50(H) 50(H) 45(H)  ALT 6 - 29 U/L 58(H) 57(H) 58(H)  Alk Phosphatase 38 - 126 U/L - 84 -  Total Bilirubin 0.2 - 1.2 mg/dL 0.8 1.0 0.8  Bilirubin, Direct 0.0 - 0.2 mg/dL - 0.1 -    No results found for: CRP    Assessment:  #1.  Chronic GERD.  Symptoms are not well controlled with S omeprazole.  Her insurance does not pay for Nexium.  Therefore will try another PPI.  #2.  Mildly elevated transaminases secondary to fatty liver.  Viral markers for hepatitis B and C have been negative in the past.  CT from July 2019 raises possibility of cirrhosis.  I am not convinced but even if she has cirrhosis it is well compensated.  Very important for patient to maintain excellent glycemic control and lose weight.   Plan:  Discontinue  Esomeprazole. Dexlansoprazole 60 mg by mouth daily before breakfast.  2-week supply given along with the prescription.  It remains to be seen if it is covered and whether or not it would provide symptomatic relief. Office visit in 6 months.

## 2018-08-29 NOTE — Patient Instructions (Signed)
Please call office if dexlansoprazole does not work.

## 2018-10-02 ENCOUNTER — Ambulatory Visit: Payer: Medicaid Other | Admitting: "Endocrinology

## 2018-10-03 ENCOUNTER — Ambulatory Visit: Payer: Medicaid Other | Admitting: Cardiovascular Disease

## 2018-10-06 ENCOUNTER — Ambulatory Visit: Payer: Medicaid Other | Admitting: "Endocrinology

## 2018-10-13 ENCOUNTER — Telehealth: Payer: Self-pay | Admitting: Cardiovascular Disease

## 2018-10-13 NOTE — Telephone Encounter (Signed)
Pt called back stating she can't get anyone on the phone at [facility], stated it told her to go online, she doesn't have a computer.

## 2018-10-13 NOTE — Telephone Encounter (Signed)
When she went to use her Repatha pen last night -it would not work.  And [facility] would not replace pen  Told her not to take anything for 14 days then take the next one after 14 days.  Told her insurance would not replace the broken pen.   She needs to know what to do

## 2018-10-13 NOTE — Telephone Encounter (Signed)
We have a sample at the [facility] office, will bring on Monday 10/16/18

## 2018-10-13 NOTE — Telephone Encounter (Signed)
Best option would be to have company replace pen (I realize hold times can be unbearable at times). Otherwise if there are samples available could consider supplying her with a sample.

## 2018-10-13 NOTE — Telephone Encounter (Signed)
When she pushed button it made sound but drug was never delivered

## 2018-11-09 ENCOUNTER — Other Ambulatory Visit: Payer: Self-pay | Admitting: Cardiovascular Disease

## 2018-11-09 MED ORDER — EVOLOCUMAB 140 MG/ML ~~LOC~~ SOAJ: 140.0000 mg | SUBCUTANEOUS | 12 refills | Status: DC

## 2018-11-09 NOTE — Telephone Encounter (Signed)
Done

## 2018-11-09 NOTE — Telephone Encounter (Signed)
Evolocumab (REPATHA SURECLICK) 774 MG/ML SOAJ [[id number]]    Needing refill sent to [facility]

## 2018-11-10 ENCOUNTER — Telehealth: Payer: Self-pay | Admitting: Cardiovascular Disease

## 2018-11-10 NOTE — Telephone Encounter (Signed)
Needs prior authorization done on her Repatha.  Will address on Tuesday as office is closed on Monday.  Patient aware.

## 2018-11-10 NOTE — Telephone Encounter (Signed)
Called stating that she needs medication authorization done so that she can get

## 2018-11-15 NOTE — Telephone Encounter (Signed)
Medicaid prior authorization faxed today.

## 2018-11-15 NOTE — Telephone Encounter (Signed)
Patient made aware.  Asked her to check with pharmacy later today to see if goes through.  Medicaid does not notify us if approved.  She will call back if pharmacy still doesn't have by Friday.

## 2018-11-15 NOTE — Telephone Encounter (Signed)
Pt is still needing a prior auth for her Repatha. She was supposed to take the shot last Thursday and she's still unable to get her medication.

## 2018-11-24 ENCOUNTER — Telehealth: Payer: Self-pay

## 2018-11-24 NOTE — Telephone Encounter (Signed)
Daughter states they are tole patient doe not have PA for repatha, see note from 11/10/18 which says PA was sent.I have messaged Lipid clinic for assistance

## 2018-11-30 ENCOUNTER — Telehealth: Payer: Self-pay

## 2018-11-30 DIAGNOSIS — E785 Hyperlipidemia, unspecified: Secondary | ICD-10-CM

## 2018-11-30 NOTE — Telephone Encounter (Signed)
-----   Message from Rockne Menghini, [place] sent at 11/28/2018  4:16 PM EDT ----- Regarding: RE: repatha Her PA is now expired.  If you can get her to have lipid labs drawn (or if someone else has done them recently).  Let me know once they're in Epic and I will do the renewal with Medicaid.  Erasmo Downer ----- Message ----- From: Bernita Raisin, RN Sent: 11/24/2018   4:28 PM EDT To: Rockne Menghini, RPH-CPP Subject: repatha                                        Madelin Rear,    Pt's daughter called and told front desk her mother cannot get Repatha?? due to PA.This is the lady I gave our sample to last month as she couldn't get hers to work. I see where Edd Fabian put in a note that prior auth was sent ? Can you advise me on what the problem could be?   Thanks,   [employer]

## 2018-11-30 NOTE — Telephone Encounter (Signed)
Pt will come tomorrow am 12-01-18 to pick up lab slip

## 2018-12-01 ENCOUNTER — Telehealth: Payer: Self-pay

## 2018-12-01 NOTE — Telephone Encounter (Signed)
-----   Message from Rockne Menghini, [place] sent at 11/28/2018  4:16 PM EDT ----- Regarding: RE: repatha Her PA is now expired.  If you can get her to have lipid labs drawn (or if someone else has done them recently).  Let me know once they're in Epic and I will do the renewal with Medicaid.  Erasmo Downer ----- Message ----- From: Bernita Raisin, RN Sent: 11/24/2018   4:28 PM EDT To: Rockne Menghini, RPH-CPP Subject: repatha                                        Madelin Rear,    Pt's daughter called and told front desk her mother cannot get Repatha?? due to PA.This is the lady I gave our sample to last month as she couldn't get hers to work. I see where Edd Fabian put in a note that prior auth was sent ? Can you advise me on what the problem could be?   Thanks,   [employer]

## 2018-12-01 NOTE — Telephone Encounter (Signed)
Patient was to come by office and get lipids drawn but she did not come.She told me yesterday she was going to the beach later today for 2 weeks

## 2018-12-02 LAB — LIPID PANEL
Cholesterol: 199 mg/dL (ref <200)
HDL: 55 mg/dL (ref >=50)
LDL Cholesterol (Calc): 118 mg/dL (calc) — ABNORMAL HIGH
Non-HDL Cholesterol (Calc): 144 mg/dL (calc) — ABNORMAL HIGH (ref <130)
Total CHOL/HDL Ratio: 3.6 (calc) (ref <5.0)
Triglycerides: 150 mg/dL — ABNORMAL HIGH (ref <150)

## 2018-12-13 ENCOUNTER — Telehealth: Payer: Self-pay | Admitting: *Deleted

## 2018-12-13 NOTE — Telephone Encounter (Signed)
Patient verbally consented for telehealth visits with [facility] and understands that her insurance company will be billed for the encounter.

## 2018-12-27 ENCOUNTER — Telehealth (INDEPENDENT_AMBULATORY_CARE_PROVIDER_SITE_OTHER): Payer: Medicaid Other | Admitting: Cardiovascular Disease

## 2018-12-27 ENCOUNTER — Encounter: Payer: Self-pay | Admitting: Cardiovascular Disease

## 2018-12-27 VITALS — Ht 69.5 in | Wt 270.0 lb

## 2018-12-27 DIAGNOSIS — I25118 Atherosclerotic heart disease of native coronary artery with other forms of angina pectoris: Secondary | ICD-10-CM

## 2018-12-27 DIAGNOSIS — I209 Angina pectoris, unspecified: Secondary | ICD-10-CM | POA: Diagnosis not present

## 2018-12-27 DIAGNOSIS — Z01812 Encounter for preprocedural laboratory examination: Secondary | ICD-10-CM

## 2018-12-27 DIAGNOSIS — I10 Essential (primary) hypertension: Secondary | ICD-10-CM

## 2018-12-27 DIAGNOSIS — Z955 Presence of coronary angioplasty implant and graft: Secondary | ICD-10-CM

## 2018-12-27 DIAGNOSIS — E785 Hyperlipidemia, unspecified: Secondary | ICD-10-CM

## 2018-12-27 NOTE — Progress Notes (Signed)
Virtual Visit via Telephone Note   This visit type was conducted due to national recommendations for restrictions regarding the COVID-19 Pandemic (e.g. social distancing) in an effort to limit this patient's exposure and mitigate transmission in our community.  Due to her co-morbid illnesses, this patient is at least at moderate risk for complications without adequate follow up.  This format is felt to be most appropriate for this patient at this time.  The patient did not have access to video technology/had technical difficulties with video requiring transitioning to audio format only (telephone).  All issues noted in this document were discussed and addressed.  No physical exam could be performed with this format.  Please refer to the patient's chart for her  consent to telehealth for [facility].   Date:  12/27/2018   ID:  Amber Fischer, DOB [birth year], MRN [id number]  Patient Location: Home Provider Location: Office  PCP:  Neale Burly, MD  Cardiologist:  Kate Sable, MD  Electrophysiologist:  None   Evaluation Performed:  Follow-Up Visit  Chief Complaint:  CAD  History of Present Illness:    Amber Fischer is a 58 y.o. female with coronary artery disease.  Her most recent cardiac catheterization was performed on 10/26/2012 and showed a patent RCA stent, 30% stenosis in the distal LAD, and 40% stenosis in the distal left circumflex coronary artery. Her RCA stent was placed on 09/18/2012.   She also has a history of hyperlipidemia, COPD, depression, fibromyalgia, GERD, hypertension, hypothyroidism, diabetes mellitus, obesity, and sleep apnea. She is a former smoker and quit in October 2012.  For the past 3 to 4 weeks she has been having precordial pain radiating to the back, left arm, and jaw.  It can occur both with and without exertion.  She has also been under a lot of stress.  She told me symptoms are reminiscent of prior anginal pain.  She does not have a  functioning blood pressure cuff.  The patient does not have symptoms concerning for COVID-19 infection (fever, chills, cough, or new shortness of breath).    Past Medical History:  Diagnosis Date  . Asthma   . Back pain, chronic   . Cirrhosis of liver ([place])   . COPD (chronic obstructive pulmonary disease) ([place])   . Coronary atherosclerosis of native coronary artery    Prior stent RCA - patent May 2014 with otherwise nonobstructive disease  . Epigastric abdominal pain 08/19/2010  . Essential hypertension, benign   . Fibromyalgia   . OCD (obsessive compulsive disorder)   . Rectal bleed 11/02/2010  . Sciatica   . Sleep apnea   . Type 2 diabetes mellitus ([place])    Past Surgical History:  Procedure Laterality Date  . ABDOMINAL HYSTERECTOMY    . CHOLECYSTECTOMY    . COLONOSCOPY  11/30/2010  . COLONOSCOPY W/ BIOPSIES  12/24/2010   NUR  . COLONOSCOPY WITH PROPOFOL N/A 01/20/2018   Procedure: COLONOSCOPY WITH PROPOFOL;  Surgeon: Rogene Houston, MD;  Location: [facility];  Service: Endoscopy;  Laterality: N/A;  12:40  . COLONOSCOPY, ESOPHAGOGASTRODUODENOSCOPY (EGD) AND ESOPHAGEAL DILATION N/A 04/05/2013   Procedure: COLONOSCOPY, ESOPHAGOGASTRODUODENOSCOPY (EGD) AND ESOPHAGEAL DILATION;  Surgeon: Rogene Houston, MD;  Location: [facility];  Service: Endoscopy;  Laterality: N/A;  . CORONARY ANGIOPLASTY     stent 5 years ago  . ESOPHAGEAL DILATION N/A 08/14/2014   Procedure: ESOPHAGEAL DILATION;  Surgeon: Rogene Houston, MD;  Location: [facility];  Service: Endoscopy;  Laterality: N/A;  . ESOPHAGEAL DILATION N/A 01/20/2018   Procedure: ESOPHAGEAL DILATION;  Surgeon: Rogene Houston, MD;  [facility]: [facility];  Service: Endoscopy;  Laterality: N/A;  . ESOPHAGOGASTRODUODENOSCOPY  03/22/2012   Procedure: ESOPHAGOGASTRODUODENOSCOPY (EGD);  Surgeon: Rogene Houston, MD;  [facility]: [facility];  Service: Endoscopy;  Laterality: N/A;  300  . ESOPHAGOGASTRODUODENOSCOPY N/A 08/14/2014    Procedure: ESOPHAGOGASTRODUODENOSCOPY (EGD);  Surgeon: Rogene Houston, MD;  [facility];  Service: Endoscopy;  Laterality: N/A;  225  . ESOPHAGOGASTRODUODENOSCOPY (EGD) WITH PROPOFOL N/A 01/20/2018   Procedure: ESOPHAGOGASTRODUODENOSCOPY (EGD) WITH PROPOFOL;  Surgeon: Rogene Houston, MD;  [facility]: [facility];  Service: Endoscopy;  Laterality: N/A;     Current Meds  Medication Sig  . albuterol (PROAIR HFA) 108 (90 BASE) MCG/ACT inhaler Inhale 2 puffs into the lungs every 6 (six) hours as needed for wheezing or shortness of breath.  Marland Kitchen aspirin EC 81 MG tablet Take 81 mg by mouth every morning.   . Biotin 300 MCG TABS Take 300 mcg by mouth every morning.   Marland Kitchen dexlansoprazole (DEXILANT) 60 MG capsule Take 1 capsule (60 mg total) by mouth daily before breakfast.  . Evolocumab (REPATHA SURECLICK) 322 MG/ML SOAJ Inject 140 mg into the skin every 14 (fourteen) days.  Marland Kitchen HYDROcodone-acetaminophen (NORCO) 7.5-325 MG tablet Take 1 tablet by mouth 3 (three) times daily as needed.   . insulin aspart (NOVOLOG) 100 UNIT/ML injection Inject into the skin 3 (three) times daily before meals. Per Sliding Scale  . isosorbide mononitrate (IMDUR) 30 MG 24 hr tablet Take 1 tablet (30 mg total) by mouth daily.  Marland Kitchen levothyroxine (SYNTHROID, LEVOTHROID) 125 MCG tablet Take 125 mcg by mouth daily before breakfast.   . loratadine (CLARITIN) 10 MG tablet Take 10 mg by mouth daily.  . metFORMIN (GLUCOPHAGE) 1000 MG tablet Take 1,000 mg by mouth 2 (two) times daily with a meal.   . methocarbamol (ROBAXIN) 500 MG tablet Take 500 mg by mouth every 8 (eight) hours as needed for muscle spasms.   . metoprolol tartrate (LOPRESSOR) 50 MG tablet Take 1 tablet (50 mg total) by mouth 2 (two) times daily.  . nitroGLYCERIN (NITROSTAT) 0.4 MG SL tablet Place 1 tablet (0.4 mg total) under the tongue every 5 (five) minutes as needed for chest pain.  Marland Kitchen tiotropium (SPIRIVA HANDIHALER) 18 MCG inhalation capsule Place 1 capsule (18  mcg total) into inhaler and inhale daily. (Patient taking differently: Place 18 mcg into inhaler and inhale daily as needed (for shortness of breath). )  . topiramate (TOPAMAX) 100 MG tablet Take 100 mg by mouth 2 (two) times daily.      Allergies:   Ciprofloxacin, Sulfonamide derivatives, Bee venom, and Pravastatin   Social History   Tobacco Use  . Smoking status: Former Smoker    Packs/day: 0.50    Years: 20.00    Pack years: 10.00    Types: Cigarettes    Start date: 08/16/1977    Quit date: 09/21/2011    Years since quitting: 7.2  . Smokeless tobacco: Never Used  . Tobacco comment: Quit 29yr ago after smoking 12 yrs  Substance Use Topics  . Alcohol use: Never    Alcohol/week: 0.0 standard drinks    Frequency: Never  . Drug use: No     Family Hx: The patient's family history includes Autoimmune disease in her daughter; Diabetes in her father and son; Fibromyalgia in her daughter, daughter, mother, and son; Heart attack in  her father; Kidney disease in her father; Memory loss in her mother; Multiple sclerosis in her sister; Stroke in her mother.  ROS:   Please see the history of present illness.     All other systems reviewed and are negative.   Prior CV studies:   The following studies were reviewed today:  Echocardiogram on3/12/2017 showed normal left ventricular systolic and diastolic function with normal regional wall motion, LVEF 60 to 65%.  Nuclear stress test on 08/29/2017 was low risk with no large ischemic territories noted.  Cardiac catheterization performed on 10/26/2012 showed a patent RCA stent, 30% stenosis in the distal LAD, and 40% stenosis in the distal left circumflex coronary artery.   Labs/Other Tests and Data Reviewed:    EKG:  No ECG reviewed.  Recent Labs: 01/11/2018: Hemoglobin 15.1; Platelets 203 06/27/2018: ALT 58; BUN 14; Creat 0.77; Potassium 4.4; Sodium 139   Recent Lipid Panel Lab Results  Component Value Date/Time   CHOL 199 12/01/2018  09:27 AM   TRIG 150 (H) 12/01/2018 09:27 AM   HDL 55 12/01/2018 09:27 AM   CHOLHDL 3.6 12/01/2018 09:27 AM   LDLCALC 118 (H) 12/01/2018 09:27 AM    Wt Readings from Last 3 Encounters:  12/27/18 270 lb (122.5 kg)  08/29/18 262 lb 4.8 oz (119 kg)  07/04/18 265 lb (120.2 kg)     Objective:    Vital Signs:  Ht 5' 9.5" (1.765 m)   Wt 270 lb (122.5 kg)   BMI 39.30 kg/m    VITAL SIGNS:  reviewed  ASSESSMENT & PLAN:    1.  Coronary artery disease: History of RCA stent placement with most recent coronary angiogram from May 2014 reviewed above.  At that time there were distal stenoses in the LAD and distal left circumflex.  She has been experiencing significant anginal symptoms over the past several weeks.  I spoke to her about coronary angiography and she is agreeable to proceed. Risks and benefits of cardiac catheterization have been discussed with the patient.  These include bleeding, infection, kidney damage, stroke, heart attack, death.  The patient understands these risks and is willing to proceed. In the meantime, I will increase Imdur to 60 mg daily.  She is also on aspirin, Lopressor, and Repatha.  2.  Hypertension: No changes to therapy.  3.  Hyperlipidemia: LDL 118 on 12/01/2018.  She is on Repatha. She is not able to tolerate statin therapy reportedly due to liver dysfunction.She had beenon Zetia.I wonder if she would be able to tolerate low-dose statin therapy 2 or 3 days/week in order to reduce LDL.    COVID-19 Education: The signs and symptoms of COVID-19 were discussed with the patient and how to seek care for testing (follow up with PCP or arrange E-visit).  The importance of social distancing was discussed today.  Time:   Today, I have spent 25 minutes with the patient with telehealth technology discussing the above problems.     Medication Adjustments/Labs and Tests Ordered: Current medicines are reviewed at length with the patient today.  Concerns regarding  medicines are outlined above.   Tests Ordered: No orders of the defined types were placed in this encounter.   Medication Changes: No orders of the defined types were placed in this encounter.   Follow Up:  Virtual Visit or In Person after cath  Signed, Kate Sable, MD  12/27/2018 1:40 PM    [place]

## 2018-12-27 NOTE — H&P (View-Only) (Signed)
Virtual Visit via Telephone Note   This visit type was conducted due to national recommendations for restrictions regarding the COVID-19 Pandemic (e.g. social distancing) in an effort to limit this patient's exposure and mitigate transmission in our community.  Due to her co-morbid illnesses, this patient is at least at moderate risk for complications without adequate follow up.  This format is felt to be most appropriate for this patient at this time.  The patient did not have access to video technology/had technical difficulties with video requiring transitioning to audio format only (telephone).  All issues noted in this document were discussed and addressed.  No physical exam could be performed with this format.  Please refer to the patient's chart for her  consent to telehealth for [facility].   Date:  12/27/2018   ID:  Amber Fischer, DOB Oct 28, 1960, MRN [id number]  Patient Location: Home Provider Location: Office  PCP:  Neale Burly, MD  Cardiologist:  Kate Sable, MD  Electrophysiologist:  None   Evaluation Performed:  Follow-Up Visit  Chief Complaint:  CAD  History of Present Illness:    Amber Fischer is a 58 y.o. female with coronary artery disease.  Her most recent cardiac catheterization was performed on 10/26/2012 and showed a patent RCA stent, 30% stenosis in the distal LAD, and 40% stenosis in the distal left circumflex coronary artery. Her RCA stent was placed on 09/18/2012.   She also has a history of hyperlipidemia, COPD, depression, fibromyalgia, GERD, hypertension, hypothyroidism, diabetes mellitus, obesity, and sleep apnea. She is a former smoker and quit in October 2012.  For the past 3 to 4 weeks she has been having precordial pain radiating to the back, left arm, and jaw.  It can occur both with and without exertion.  She has also been under a lot of stress.  She told me symptoms are reminiscent of prior anginal pain.  She does not have a  functioning blood pressure cuff.  The patient does not have symptoms concerning for COVID-19 infection (fever, chills, cough, or new shortness of breath).    Past Medical History:  Diagnosis Date  . Asthma   . Back pain, chronic   . Cirrhosis of liver ([place])   . COPD (chronic obstructive pulmonary disease) ([place])   . Coronary atherosclerosis of native coronary artery    Prior stent RCA - patent May 2014 with otherwise nonobstructive disease  . Epigastric abdominal pain 08/19/2010  . Essential hypertension, benign   . Fibromyalgia   . OCD (obsessive compulsive disorder)   . Rectal bleed 11/02/2010  . Sciatica   . Sleep apnea   . Type 2 diabetes mellitus ([place])    Past Surgical History:  Procedure Laterality Date  . ABDOMINAL HYSTERECTOMY    . CHOLECYSTECTOMY    . COLONOSCOPY  11/30/2010  . COLONOSCOPY W/ BIOPSIES  12/24/2010   NUR  . COLONOSCOPY WITH PROPOFOL N/A 01/20/2018   Procedure: COLONOSCOPY WITH PROPOFOL;  Surgeon: Rogene Houston, MD;  [facility];  Service: Endoscopy;  Laterality: N/A;  12:40  . COLONOSCOPY, ESOPHAGOGASTRODUODENOSCOPY (EGD) AND ESOPHAGEAL DILATION N/A 04/05/2013   Procedure: COLONOSCOPY, ESOPHAGOGASTRODUODENOSCOPY (EGD) AND ESOPHAGEAL DILATION;  Surgeon: Rogene Houston, MD;  [facility];  Service: Endoscopy;  Laterality: N/A;  . CORONARY ANGIOPLASTY     stent 5 years ago  . ESOPHAGEAL DILATION N/A 08/14/2014   Procedure: ESOPHAGEAL DILATION;  Surgeon: Rogene Houston, MD;  [facility];  Service: Endoscopy;  Laterality: N/A;  . ESOPHAGEAL DILATION N/A 01/20/2018   Procedure: ESOPHAGEAL DILATION;  Surgeon: Rogene Houston, MD;  [facility];  Service: Endoscopy;  Laterality: N/A;  . ESOPHAGOGASTRODUODENOSCOPY  03/22/2012   Procedure: ESOPHAGOGASTRODUODENOSCOPY (EGD);  Surgeon: Rogene Houston, MD;  [facility];  Service: Endoscopy;  Laterality: N/A;  300  . ESOPHAGOGASTRODUODENOSCOPY N/A 08/14/2014    Procedure: ESOPHAGOGASTRODUODENOSCOPY (EGD);  Surgeon: Rogene Houston, MD;  [facility];  Service: Endoscopy;  Laterality: N/A;  225  . ESOPHAGOGASTRODUODENOSCOPY (EGD) WITH PROPOFOL N/A 01/20/2018   Procedure: ESOPHAGOGASTRODUODENOSCOPY (EGD) WITH PROPOFOL;  Surgeon: Rogene Houston, MD;  [facility];  Service: Endoscopy;  Laterality: N/A;     Current Meds  Medication Sig  . albuterol (PROAIR HFA) 108 (90 BASE) MCG/ACT inhaler Inhale 2 puffs into the lungs every 6 (six) hours as needed for wheezing or shortness of breath.  Marland Kitchen aspirin EC 81 MG tablet Take 81 mg by mouth every morning.   . Biotin 300 MCG TABS Take 300 mcg by mouth every morning.   Marland Kitchen dexlansoprazole (DEXILANT) 60 MG capsule Take 1 capsule (60 mg total) by mouth daily before breakfast.  . Evolocumab (REPATHA SURECLICK) 825 MG/ML SOAJ Inject 140 mg into the skin every 14 (fourteen) days.  Marland Kitchen HYDROcodone-acetaminophen (NORCO) 7.5-325 MG tablet Take 1 tablet by mouth 3 (three) times daily as needed.   . insulin aspart (NOVOLOG) 100 UNIT/ML injection Inject into the skin 3 (three) times daily before meals. Per Sliding Scale  . isosorbide mononitrate (IMDUR) 30 MG 24 hr tablet Take 1 tablet (30 mg total) by mouth daily.  Marland Kitchen levothyroxine (SYNTHROID, LEVOTHROID) 125 MCG tablet Take 125 mcg by mouth daily before breakfast.   . loratadine (CLARITIN) 10 MG tablet Take 10 mg by mouth daily.  . metFORMIN (GLUCOPHAGE) 1000 MG tablet Take 1,000 mg by mouth 2 (two) times daily with a meal.   . methocarbamol (ROBAXIN) 500 MG tablet Take 500 mg by mouth every 8 (eight) hours as needed for muscle spasms.   . metoprolol tartrate (LOPRESSOR) 50 MG tablet Take 1 tablet (50 mg total) by mouth 2 (two) times daily.  . nitroGLYCERIN (NITROSTAT) 0.4 MG SL tablet Place 1 tablet (0.4 mg total) under the tongue every 5 (five) minutes as needed for chest pain.  Marland Kitchen tiotropium (SPIRIVA HANDIHALER) 18 MCG inhalation capsule Place 1 capsule (18  mcg total) into inhaler and inhale daily. (Patient taking differently: Place 18 mcg into inhaler and inhale daily as needed (for shortness of breath). )  . topiramate (TOPAMAX) 100 MG tablet Take 100 mg by mouth 2 (two) times daily.      Allergies:   Ciprofloxacin, Sulfonamide derivatives, Bee venom, and Pravastatin   Social History   Tobacco Use  . Smoking status: Former Smoker    Packs/day: 0.50    Years: 20.00    Pack years: 10.00    Types: Cigarettes    Start date: 08/16/1977    Quit date: 09/21/2011    Years since quitting: 7.2  . Smokeless tobacco: Never Used  . Tobacco comment: Quit 52yr ago after smoking 12 yrs  Substance Use Topics  . Alcohol use: Never    Alcohol/week: 0.0 standard drinks    Frequency: Never  . Drug use: No     Family Hx: The patient's family history includes Autoimmune disease in her daughter; Diabetes in her father and son; Fibromyalgia in her daughter, daughter, mother, and son; Heart attack in  her father; Kidney disease in her father; Memory loss in her mother; Multiple sclerosis in her sister; Stroke in her mother.  ROS:   Please see the history of present illness.     All other systems reviewed and are negative.   Prior CV studies:   The following studies were reviewed today:  Echocardiogram on3/12/2017 showed normal left ventricular systolic and diastolic function with normal regional wall motion, LVEF 60 to 65%.  Nuclear stress test on 08/29/2017 was low risk with no large ischemic territories noted.  Cardiac catheterization performed on 10/26/2012 showed a patent RCA stent, 30% stenosis in the distal LAD, and 40% stenosis in the distal left circumflex coronary artery.   Labs/Other Tests and Data Reviewed:    EKG:  No ECG reviewed.  Recent Labs: 01/11/2018: Hemoglobin 15.1; Platelets 203 06/27/2018: ALT 58; BUN 14; Creat 0.77; Potassium 4.4; Sodium 139   Recent Lipid Panel Lab Results  Component Value Date/Time   CHOL 199 12/01/2018  09:27 AM   TRIG 150 (H) 12/01/2018 09:27 AM   HDL 55 12/01/2018 09:27 AM   CHOLHDL 3.6 12/01/2018 09:27 AM   LDLCALC 118 (H) 12/01/2018 09:27 AM    Wt Readings from Last 3 Encounters:  12/27/18 270 lb (122.5 kg)  08/29/18 262 lb 4.8 oz (119 kg)  07/04/18 265 lb (120.2 kg)     Objective:    Vital Signs:  Ht 5' 9.5" (1.765 m)   Wt 270 lb (122.5 kg)   BMI 39.30 kg/m    VITAL SIGNS:  reviewed  ASSESSMENT & PLAN:    1.  Coronary artery disease: History of RCA stent placement with most recent coronary angiogram from May 2014 reviewed above.  At that time there were distal stenoses in the LAD and distal left circumflex.  She has been experiencing significant anginal symptoms over the past several weeks.  I spoke to her about coronary angiography and she is agreeable to proceed. Risks and benefits of cardiac catheterization have been discussed with the patient.  These include bleeding, infection, kidney damage, stroke, heart attack, death.  The patient understands these risks and is willing to proceed. In the meantime, I will increase Imdur to 60 mg daily.  She is also on aspirin, Lopressor, and Repatha.  2.  Hypertension: No changes to therapy.  3.  Hyperlipidemia: LDL 118 on 12/01/2018.  She is on Repatha. She is not able to tolerate statin therapy reportedly due to liver dysfunction.She had beenon Zetia.I wonder if she would be able to tolerate low-dose statin therapy 2 or 3 days/week in order to reduce LDL.    COVID-19 Education: The signs and symptoms of COVID-19 were discussed with the patient and how to seek care for testing (follow up with PCP or arrange E-visit).  The importance of social distancing was discussed today.  Time:   Today, I have spent 25 minutes with the patient with telehealth technology discussing the above problems.     Medication Adjustments/Labs and Tests Ordered: Current medicines are reviewed at length with the patient today.  Concerns regarding  medicines are outlined above.   Tests Ordered: No orders of the defined types were placed in this encounter.   Medication Changes: No orders of the defined types were placed in this encounter.   Follow Up:  Virtual Visit or In Person after cath  Signed, Kate Sable, MD  12/27/2018 1:40 PM    [place]

## 2018-12-28 ENCOUNTER — Telehealth: Payer: Self-pay | Admitting: Cardiovascular Disease

## 2018-12-28 ENCOUNTER — Other Ambulatory Visit: Payer: Self-pay | Admitting: Cardiovascular Disease

## 2018-12-28 ENCOUNTER — Encounter: Payer: Self-pay | Admitting: *Deleted

## 2018-12-28 DIAGNOSIS — I209 Angina pectoris, unspecified: Secondary | ICD-10-CM

## 2018-12-28 MED ORDER — SODIUM CHLORIDE 0.9% FLUSH: 3.0000 mL | Freq: Two times a day (BID) | INTRAVENOUS | Status: AC

## 2018-12-28 NOTE — Patient Instructions (Signed)
Medication Instructions:   Increase Imdur to 60mg  daily.  You may take 2 of the 30mg  tabs at the same time until you finish your current supply.   A new 60mg  tablet has been sent to the pharmacy today.  Continue all other medications.    Labwork: BMET, CBC   Testing/Procedures: Your physician has requested that you have a cardiac catheterization. Cardiac catheterization is used to diagnose and/or treat various heart conditions. Doctors may recommend this procedure for a number of different reasons. The most common reason is to evaluate chest pain. Chest pain can be a symptom of coronary artery disease (CAD), and cardiac catheterization can show whether plaque is narrowing or blocking your heart's arteries. This procedure is also used to evaluate the valves, as well as measure the blood flow and oxygen levels in different parts of your heart. For further information please visit [facility]. Please follow instruction sheet, as given.  Follow-Up: 1 month after cath   Any Other Special Instructions Will Be Listed Below (If Applicable).  If you need a refill on your cardiac medications before your next appointment, please call your pharmacy.

## 2018-12-28 NOTE — Addendum Note (Signed)
Addended by: Laurine Blazer on: 12/28/2018 05:38 PM   Modules accepted: Orders

## 2018-12-28 NOTE — Telephone Encounter (Signed)
Pre-cert Verification for the following procedure    Left heart cath - scheduled for Friday, 7/17 at 7:30 am w/ Irish Lack

## 2019-01-01 ENCOUNTER — Other Ambulatory Visit (HOSPITAL_COMMUNITY)
Admission: RE | Admit: 2019-01-01 | Discharge: 2019-01-01 | Disposition: A | Discharge status: Home / Self Care | Payer: Medicaid Other | Source: Ambulatory Visit | Attending: Cardiovascular Disease | Admitting: Cardiovascular Disease

## 2019-01-01 ENCOUNTER — Other Ambulatory Visit: Payer: Self-pay

## 2019-01-01 ENCOUNTER — Other Ambulatory Visit: Payer: Self-pay | Admitting: *Deleted

## 2019-01-01 ENCOUNTER — Telehealth: Payer: Self-pay | Admitting: *Deleted

## 2019-01-01 DIAGNOSIS — Z1159 Encounter for screening for other viral diseases: Secondary | ICD-10-CM | POA: Diagnosis present

## 2019-01-01 LAB — CBC
HCT: 41.7 % (ref 35.0–45.0)
Hemoglobin: 13.8 g/dL (ref 11.7–15.5)
MCH: 29.9 pg (ref 27.0–33.0)
MCHC: 33.1 g/dL (ref 32.0–36.0)
MCV: 90.3 fL (ref 80.0–100.0)
MPV: 13 fL — ABNORMAL HIGH (ref 7.5–12.5)
Platelets: 162 10*3/uL (ref 140–400)
RBC: 4.62 10*6/uL (ref 3.80–5.10)
RDW: 12.7 % (ref 11.0–15.0)
WBC: 5.4 10*3/uL (ref 3.8–10.8)

## 2019-01-01 LAB — BASIC METABOLIC PANEL
BUN: 15 mg/dL (ref 7–25)
CO2: 24 mmol/L (ref 20–32)
Calcium: 9.3 mg/dL (ref 8.6–10.4)
Chloride: 110 mmol/L (ref 98–110)
Creat: 0.81 mg/dL (ref 0.50–1.05)
Glucose, Bld: 116 mg/dL — ABNORMAL HIGH (ref 65–99)
Potassium: 4.1 mmol/L (ref 3.5–5.3)
Sodium: 142 mmol/L (ref 135–146)

## 2019-01-01 MED ORDER — NITROGLYCERIN 0.4 MG SL SUBL: 0.4000 mg | SUBLINGUAL_TABLET | SUBLINGUAL | 3 refills | Status: DC | PRN

## 2019-01-01 MED ORDER — ISOSORBIDE MONONITRATE ER 60 MG PO TB24: 60.0000 mg | ORAL_TABLET | Freq: Every day | ORAL | 6 refills | Status: DC

## 2019-01-01 NOTE — Telephone Encounter (Signed)
Notes recorded by Laurine Blazer, LPN on 1/95/0932 at 6:71 PM EDT  Patient notified. Copy to pmd.  ------   Notes recorded by Herminio Commons, MD on 01/01/2019 at 2:47 PM EDT  Normal renal function

## 2019-01-02 LAB — SARS CORONAVIRUS 2 (TAT 6-24 HRS): SARS Coronavirus 2: NEGATIVE

## 2019-01-03 ENCOUNTER — Telehealth: Payer: Self-pay | Admitting: *Deleted

## 2019-01-03 NOTE — Telephone Encounter (Signed)
-----   Message from Herminio Commons, MD sent at 01/02/2019  8:51 AM EDT ----- Negative for COVID

## 2019-01-03 NOTE — Telephone Encounter (Signed)
Pt aware - routed to pcp  

## 2019-01-03 NOTE — Telephone Encounter (Addendum)
Pt contacted pre-catheterization scheduled at [facility] for: Thursday July 16,2020 7:30 AM Verified arrival time and place: [place] Entrance A at: 5:30 AM  Covid-19 test date: 01/01/19  No solid food after midnight prior to cath, clear liquids until 5 AM day of procedure. Contrast allergy: no  Hold: Insulin-AM of procedure. Metformin-day of procedure and 48 hours post procedure  Except hold medication AM meds can be  taken pre-cath with sip of water including: ASA 81 mg   Confirmed patient has responsible person to drive home post procedure and observe 24 hours after arriving home: yes      Pt advised [place] requires patients to wear mask when they enter hospital.    Pt advised due to Covid-19 pandemic, no visitors are allowed in the hospital at this time.  Their designated party will be called with an update after procedure.        COVID-19 Pre-Screening Questions:  . In the past 7 to 10 days have you had a cough,  shortness of breath, headache, congestion, fever (100 or greater) body aches, chills, sore throat, or sudden loss of taste or sense of smell? Headache/shortness of breath . Have you been around anyone with known Covid 19? no . Have you been around anyone who is awaiting Covid 19 test results in the past 7 to 10 days? no . Have you been around anyone who has been exposed to Covid 19, or has mentioned symptoms of Covid 19 within the past 7 to 10 days? No  I reviewed procedure instructions, mask/visitor/Covid-19 screening questions with patient, she verbalized understanding.

## 2019-01-04 ENCOUNTER — Encounter (HOSPITAL_COMMUNITY): Payer: Self-pay | Admitting: Interventional Cardiology

## 2019-01-04 ENCOUNTER — Other Ambulatory Visit: Payer: Self-pay

## 2019-01-04 ENCOUNTER — Ambulatory Visit (HOSPITAL_COMMUNITY)
Admission: RE | Admit: 2019-01-04 | Discharge: 2019-01-04 | Disposition: A | Discharge status: Home / Self Care | Payer: Medicaid Other | Attending: Interventional Cardiology | Admitting: Interventional Cardiology

## 2019-01-04 ENCOUNTER — Ambulatory Visit (HOSPITAL_COMMUNITY)
Admission: RE | Disposition: A | Discharge status: Home / Self Care | Payer: Self-pay | Source: Home / Self Care | Attending: Interventional Cardiology

## 2019-01-04 DIAGNOSIS — J449 Chronic obstructive pulmonary disease, unspecified: Secondary | ICD-10-CM | POA: Diagnosis not present

## 2019-01-04 DIAGNOSIS — I209 Angina pectoris, unspecified: Secondary | ICD-10-CM | POA: Diagnosis present

## 2019-01-04 DIAGNOSIS — Z87891 Personal history of nicotine dependence: Secondary | ICD-10-CM | POA: Insufficient documentation

## 2019-01-04 DIAGNOSIS — E039 Hypothyroidism, unspecified: Secondary | ICD-10-CM | POA: Diagnosis not present

## 2019-01-04 DIAGNOSIS — Z6839 Body mass index (BMI) 39.0-39.9, adult: Secondary | ICD-10-CM | POA: Diagnosis not present

## 2019-01-04 DIAGNOSIS — E785 Hyperlipidemia, unspecified: Secondary | ICD-10-CM | POA: Insufficient documentation

## 2019-01-04 DIAGNOSIS — Z8249 Family history of ischemic heart disease and other diseases of the circulatory system: Secondary | ICD-10-CM | POA: Diagnosis not present

## 2019-01-04 DIAGNOSIS — K219 Gastro-esophageal reflux disease without esophagitis: Secondary | ICD-10-CM | POA: Diagnosis not present

## 2019-01-04 DIAGNOSIS — E669 Obesity, unspecified: Secondary | ICD-10-CM | POA: Insufficient documentation

## 2019-01-04 DIAGNOSIS — E119 Type 2 diabetes mellitus without complications: Secondary | ICD-10-CM | POA: Insufficient documentation

## 2019-01-04 DIAGNOSIS — Z79899 Other long term (current) drug therapy: Secondary | ICD-10-CM | POA: Insufficient documentation

## 2019-01-04 DIAGNOSIS — Z7982 Long term (current) use of aspirin: Secondary | ICD-10-CM | POA: Insufficient documentation

## 2019-01-04 DIAGNOSIS — Z794 Long term (current) use of insulin: Secondary | ICD-10-CM | POA: Insufficient documentation

## 2019-01-04 DIAGNOSIS — K7460 Unspecified cirrhosis of liver: Secondary | ICD-10-CM | POA: Insufficient documentation

## 2019-01-04 DIAGNOSIS — I25119 Atherosclerotic heart disease of native coronary artery with unspecified angina pectoris: Secondary | ICD-10-CM | POA: Diagnosis not present

## 2019-01-04 DIAGNOSIS — Z90710 Acquired absence of both cervix and uterus: Secondary | ICD-10-CM | POA: Insufficient documentation

## 2019-01-04 DIAGNOSIS — Z955 Presence of coronary angioplasty implant and graft: Secondary | ICD-10-CM | POA: Insufficient documentation

## 2019-01-04 DIAGNOSIS — I10 Essential (primary) hypertension: Secondary | ICD-10-CM | POA: Insufficient documentation

## 2019-01-04 DIAGNOSIS — G4730 Sleep apnea, unspecified: Secondary | ICD-10-CM | POA: Diagnosis not present

## 2019-01-04 DIAGNOSIS — M797 Fibromyalgia: Secondary | ICD-10-CM | POA: Diagnosis not present

## 2019-01-04 DIAGNOSIS — Z881 Allergy status to other antibiotic agents status: Secondary | ICD-10-CM | POA: Diagnosis not present

## 2019-01-04 DIAGNOSIS — Z823 Family history of stroke: Secondary | ICD-10-CM | POA: Insufficient documentation

## 2019-01-04 DIAGNOSIS — Z882 Allergy status to sulfonamides status: Secondary | ICD-10-CM | POA: Insufficient documentation

## 2019-01-04 DIAGNOSIS — Z888 Allergy status to other drugs, medicaments and biological substances status: Secondary | ICD-10-CM | POA: Diagnosis not present

## 2019-01-04 DIAGNOSIS — Z79890 Hormone replacement therapy (postmenopausal): Secondary | ICD-10-CM | POA: Insufficient documentation

## 2019-01-04 HISTORY — PX: LEFT HEART CATH AND CORONARY ANGIOGRAPHY: CATH118249

## 2019-01-04 LAB — GLUCOSE, CAPILLARY
Glucose-Capillary: 119 mg/dL — ABNORMAL HIGH (ref 70–99)
Glucose-Capillary: 88 mg/dL (ref 70–99)

## 2019-01-04 SURGERY — LEFT HEART CATH AND CORONARY ANGIOGRAPHY
Anesthesia: LOCAL

## 2019-01-04 MED ORDER — SODIUM CHLORIDE 0.9 % IV SOLN: INTRAVENOUS | Status: AC

## 2019-01-04 MED ORDER — MIDAZOLAM HCL 2 MG/2ML IJ SOLN
INTRAMUSCULAR | Status: AC
  Filled 2019-01-04: qty 2

## 2019-01-04 MED ORDER — LABETALOL HCL 5 MG/ML IV SOLN: 10.0000 mg | INTRAVENOUS | Status: DC | PRN

## 2019-01-04 MED ORDER — SODIUM CHLORIDE 0.9 % IV SOLN: 250.0000 mL | INTRAVENOUS | Status: DC | PRN

## 2019-01-04 MED ORDER — LIDOCAINE HCL (PF) 1 % IJ SOLN
INTRAMUSCULAR | Status: AC
  Filled 2019-01-04: qty 30

## 2019-01-04 MED ORDER — IOHEXOL 350 MG/ML SOLN
INTRAVENOUS | Status: DC | PRN
  Administered 2019-01-04: 45 mL via INTRA_ARTERIAL

## 2019-01-04 MED ORDER — SODIUM CHLORIDE 0.9 % WEIGHT BASED INFUSION
3.0000 mL/kg/h | INTRAVENOUS | Status: AC
  Administered 2019-01-04: 3 mL/kg/h via INTRAVENOUS

## 2019-01-04 MED ORDER — VERAPAMIL HCL 2.5 MG/ML IV SOLN
INTRAVENOUS | Status: AC
  Filled 2019-01-04: qty 2

## 2019-01-04 MED ORDER — HEPARIN (PORCINE) IN NACL 1000-0.9 UT/500ML-% IV SOLN
INTRAVENOUS | Status: AC
  Filled 2019-01-04: qty 1000

## 2019-01-04 MED ORDER — MIDAZOLAM HCL 2 MG/2ML IJ SOLN
INTRAMUSCULAR | Status: DC | PRN
  Administered 2019-01-04: 1 mg via INTRAVENOUS
  Administered 2019-01-04: 2 mg via INTRAVENOUS

## 2019-01-04 MED ORDER — HEPARIN (PORCINE) IN NACL 1000-0.9 UT/500ML-% IV SOLN
INTRAVENOUS | Status: DC | PRN
  Administered 2019-01-04: 500 mL

## 2019-01-04 MED ORDER — SODIUM CHLORIDE 0.9% FLUSH: 3.0000 mL | Freq: Two times a day (BID) | INTRAVENOUS | Status: DC

## 2019-01-04 MED ORDER — ASPIRIN 81 MG PO CHEW: 81.0000 mg | CHEWABLE_TABLET | ORAL | Status: DC

## 2019-01-04 MED ORDER — FENTANYL CITRATE (PF) 100 MCG/2ML IJ SOLN
INTRAMUSCULAR | Status: AC
  Filled 2019-01-04: qty 2

## 2019-01-04 MED ORDER — SODIUM CHLORIDE 0.9% FLUSH: 3.0000 mL | INTRAVENOUS | Status: DC | PRN

## 2019-01-04 MED ORDER — ACETAMINOPHEN 325 MG PO TABS: 650.0000 mg | ORAL_TABLET | ORAL | Status: DC | PRN

## 2019-01-04 MED ORDER — LIDOCAINE HCL (PF) 1 % IJ SOLN
INTRAMUSCULAR | Status: DC | PRN
  Administered 2019-01-04: 20 mL
  Administered 2019-01-04: 5 mL

## 2019-01-04 MED ORDER — HYDRALAZINE HCL 20 MG/ML IJ SOLN: 10.0000 mg | INTRAMUSCULAR | Status: DC | PRN

## 2019-01-04 MED ORDER — FENTANYL CITRATE (PF) 100 MCG/2ML IJ SOLN
INTRAMUSCULAR | Status: DC | PRN
  Administered 2019-01-04 (×2): 25 ug via INTRAVENOUS

## 2019-01-04 MED ORDER — ONDANSETRON HCL 4 MG/2ML IJ SOLN: 4.0000 mg | Freq: Four times a day (QID) | INTRAMUSCULAR | Status: DC | PRN

## 2019-01-04 MED ORDER — SODIUM CHLORIDE 0.9 % WEIGHT BASED INFUSION: 1.0000 mL/kg/h | INTRAVENOUS | Status: DC

## 2019-01-04 SURGICAL SUPPLY — 11 items
CATH INFINITI 5FR MULTPACK ANG (CATHETERS) ×1 IMPLANT
KIT HEART LEFT (KITS) ×2 IMPLANT
PACK CARDIAC CATHETERIZATION (CUSTOM PROCEDURE TRAY) ×2 IMPLANT
PINNACLE LONG 5F 25CM (SHEATH) ×2
SHEATH INTROD PINNACLE 5F 25CM (SHEATH) IMPLANT
SHEATH PINNACLE 5F 10CM (SHEATH) IMPLANT
SHEATH PROBE COVER 6X72 (BAG) ×1 IMPLANT
SHEATH RAIN RADIAL 21G 6FR (SHEATH) IMPLANT
TRANSDUCER W/STOPCOCK (MISCELLANEOUS) ×2 IMPLANT
TUBING CIL FLEX 10 FLL-RA (TUBING) ×1 IMPLANT
WIRE EMERALD 3MM-J .035X150CM (WIRE) ×1 IMPLANT

## 2019-01-04 NOTE — Interval H&P Note (Signed)
Cath Lab Visit (complete for each Cath Lab visit)  Clinical Evaluation Leading to the Procedure:   ACS: No.  Non-ACS:    Anginal Classification: CCS III  Anti-ischemic medical therapy: Maximal Therapy (2 or more classes of medications)  Non-Invasive Test Results: No non-invasive testing performed  Prior CABG: No previous CABG      History and Physical Interval Note:  01/04/2019 7:35 AM  Amber Fischer  has presented today for surgery, with the diagnosis of Angina.  The various methods of treatment have been discussed with the patient and family. After consideration of risks, benefits and other options for treatment, the patient has consented to  Procedure(s): LEFT HEART CATH AND CORONARY ANGIOGRAPHY (N/A) as a surgical intervention.  The patient's history has been reviewed, patient examined, no change in status, stable for surgery.  I have reviewed the patient's chart and labs.  Questions were answered to the patient's satisfaction.     Larae Grooms

## 2019-01-04 NOTE — Discharge Instructions (Signed)
HOLD METFORMIN FOR A FULL 48 HOURS AFTER DISCHARGE.    DRINK PLENTY OF FLUIDS FOR THE NEXT 2-3 DAYS TO KEEP HYDRATED.    Femoral Site Care This sheet gives you information about how to care for yourself after your procedure. Your health care provider may also give you more specific instructions. If you have problems or questions, contact your health care provider. What can I expect after the procedure? After the procedure, it is common to have:  Bruising that usually fades within 1-2 weeks.  Tenderness at the site. Follow these instructions at home: Wound care  Follow instructions from your health care provider about how to take care of your insertion site. Make sure you: ? Wash your hands with soap and water before you change your bandage (dressing). If soap and water are not available, use hand sanitizer. ? Change your dressing as told by your health care provider. ? Leave stitches (sutures), skin glue, or adhesive strips in place. These skin closures may need to stay in place for 2 weeks or longer. If adhesive strip edges start to loosen and curl up, you may trim the loose edges. Do not remove adhesive strips completely unless your health care provider tells you to do that.  Do not take baths, swim, or use a hot tub until your health care provider approves.  You may shower 24-48 hours after the procedure or as told by your health care provider. ? Gently wash the site with plain soap and water. ? Pat the area dry with a clean towel. ? Do not rub the site. This may cause bleeding.  Do not apply powder or lotion to the site. Keep the site clean and dry.  Check your femoral site every day for signs of infection. Check for: ? Redness, swelling, or pain. ? Fluid or blood. ? Warmth. ? Pus or a bad smell. Activity  For the first 2-3 days after your procedure, or as long as directed: ? Avoid climbing stairs as much as possible. ? Do not squat.  Do not lift anything that is  heavier than 10 lb (4.5 kg) foe 5 days.  Rest as directed. ? Avoid sitting for a long time without moving. Get up to take short walks every 1-2 hours.  Do not drive for 24 hours if you were given a medicine to help you relax (sedative). General instructions  Take over-the-counter and prescription medicines only as told by your health care provider.  Keep all follow-up visits as told by your health care provider. This is important. Contact a health care provider if you have:  A fever or chills.  You have redness, swelling, or pain around your insertion site. Get help right away if:  The catheter insertion area swells very fast.  You pass out.  You suddenly start to sweat or your skin gets clammy.  The catheter insertion area is bleeding, and the bleeding does not stop when you hold steady pressure on the area.  The area near or just beyond the catheter insertion site becomes pale, cool, tingly, or numb. These symptoms may represent a serious problem that is an emergency. Do not wait to see if the symptoms will go away. Get medical help right away. Call your local emergency services (911 in the [place] Do not drive yourself to the hospital. Summary  After the procedure, it is common to have bruising that usually fades within 1-2 weeks.  Check your femoral site every day for signs of infection.  Do not lift anything that is heavier than 10 lb (4.5 kg) for 5 days. This information is not intended to replace advice given to you by your health care provider. Make sure you discuss any questions you have with your health care provider. Document Released: 02/08/2014 Document Revised: 06/20/2017 Document Reviewed: 06/20/2017 Elsevier Patient Education  2020 [employer].

## 2019-01-04 NOTE — Progress Notes (Signed)
    5 Fr Long R  Femoral sheath was pulled, and manual pressure was held for 15 min. R groin was soft and non tender. Sterile gauze was placed on the incision site. R DP was palpable before and after the sheath pull.   HR 56 SB  BP 112/67  sPO2 100% on R/A

## 2019-01-05 MED FILL — Verapamil HCl IV Soln 2.5 MG/ML: INTRAVENOUS | Qty: 2 | Status: AC

## 2019-01-18 ENCOUNTER — Telehealth (INDEPENDENT_AMBULATORY_CARE_PROVIDER_SITE_OTHER): Payer: Self-pay | Admitting: Internal Medicine

## 2019-01-18 NOTE — Telephone Encounter (Signed)
Per Dr.Rehman  May try the Ditilazem 2% gel - patient to apply 1/2 inch to the affected area twice a day. 30 grams, 1 refill. Patient may also do setz baths. Rx was called to [facility]/ [place]. They will call the patient when the Rx is ready.  If this does not work , may do a surgical consult.  Patient was called ,spoke with her Mother. She will tell her daughter.

## 2019-01-18 NOTE — Telephone Encounter (Signed)
Patient called stated Dr Laural Golden has prescribed her about 6 different creams for her anal area - nothing seems to help - please call her at [phone]

## 2019-01-25 ENCOUNTER — Encounter (INDEPENDENT_AMBULATORY_CARE_PROVIDER_SITE_OTHER): Payer: Self-pay | Admitting: Nurse Practitioner

## 2019-01-25 ENCOUNTER — Ambulatory Visit (INDEPENDENT_AMBULATORY_CARE_PROVIDER_SITE_OTHER): Payer: Medicaid Other | Admitting: Nurse Practitioner

## 2019-01-25 ENCOUNTER — Other Ambulatory Visit: Payer: Self-pay

## 2019-01-25 VITALS — BP 119/79 | HR 65 | Temp 98.1°F | Resp 18 | Ht 65.0 in | Wt 260.7 lb

## 2019-01-25 DIAGNOSIS — K602 Anal fissure, unspecified: Secondary | ICD-10-CM

## 2019-01-25 NOTE — Progress Notes (Signed)
Subjective:    Patient ID: Amber Fischer, female    DOB: 27-Dec-1960, 58 y.o.   MRN: [id number]  HPI   Amber Fischer is a 58 year old female with a PMH of CAD with 1 coronary stent, angina, HTN, DM II, COPD, asthma, NAFLD with questionable early cirrhosis, GERD and dysphagia requiring esophageal dilatation. She presents today with complaints of a persistent burning rash to her anal area and gluteal fold which has persisted for more than 6 months. She has intermittent red blood on the toilet tissue. Her anal and gluteal irritation and burning type pain worsens after she defecates. She has used steroid, antifungal and Diltiazem ointments without improvement. She uses Dial, [place] and varius antibacterial soaps to the anal/rectal/gluteal area when bathing. GERD is well controlled with Dexilant. No current dysphagia.  Her most recent colonoscopy was 01/20/2018 which showed an anal fissure treated with diltiazem gel. The colon was normal. A repeat colonoscopy advised in 10 years.  An EGD was done on the same date which showed a normal esophagus, no EV, a 2 cm HH, no endoscopic esophageal abnormality to explain patient's dysphagia, the esophagus was dilated with a Maloney dilator 73 F, a few gastric polyps and portal hypertensive gastropathy was present.  01/01/2019 WBC 5.4. Hg 12.8. HCT 41.7. PLT 162 (12/2017 PLT 203) 06/27/2018 AST 50. ALT 58.  01/20/2018 Hep B surf ag and Hep C ab 01/20/2018 negative. SMA, AMA and ANA normal.  CTAP w/contrast 12/27/2017: 1. CT findings consistent with cirrhotic changes involving the liver with portal venous hypertension and portal venous collaterals but no splenomegaly or ascites. No worrisome hepatic lesions. 2. Status post cholecystectomy.  No biliary dilatation. 3. No acute abdominal/pelvic findings, mass lesions or lymphadenopathy. 4. Moderate stool throughout the colon suggesting constipation  S/P cardiac catheterization 01/04/2019:  Previously placed Prox RCA stent  (unknown type) is widely patent.  Ost RCA to Prox RCA lesion is 20% stenosed.  Prox Cx lesion is 40% stenosed.  Dist LAD lesion is 30% stenosed.  The left ventricular systolic function is normal.  LV end diastolic pressure is normal. LVEDP 15 mm Hg.  The left ventricular ejection fraction is 55-65% by visual estimate.  There is no aortic valve stenosis.  Unable to onbtain right radial access.   Nonobstructive CAD.  Continue medical therapy  Past Medical History:  Diagnosis Date  . Asthma   . Back pain, chronic   . Cirrhosis of liver ([place])   . COPD (chronic obstructive pulmonary disease) ([place])   . Coronary atherosclerosis of native coronary artery    Prior stent RCA - patent May 2014 with otherwise nonobstructive disease  . Epigastric abdominal pain 08/19/2010  . Essential hypertension, benign   . Fibromyalgia   . OCD (obsessive compulsive disorder)   . Rectal bleed 11/02/2010  . Sciatica   . Sleep apnea   . Type 2 diabetes mellitus ([place])    Past Surgical History:  Procedure Laterality Date  . ABDOMINAL HYSTERECTOMY    . CHOLECYSTECTOMY    . COLONOSCOPY  11/30/2010  . COLONOSCOPY W/ BIOPSIES  12/24/2010   NUR  . COLONOSCOPY WITH PROPOFOL N/A 01/20/2018   Procedure: COLONOSCOPY WITH PROPOFOL;  Surgeon: Rogene Houston, MD;  [facility];  Service: Endoscopy;  Laterality: N/A;  12:40  . COLONOSCOPY, ESOPHAGOGASTRODUODENOSCOPY (EGD) AND ESOPHAGEAL DILATION N/A 04/05/2013   Procedure: COLONOSCOPY, ESOPHAGOGASTRODUODENOSCOPY (EGD) AND ESOPHAGEAL DILATION;  Surgeon: Rogene Houston, MD;  [facility];  Service: Endoscopy;  Laterality: N/A;  . CORONARY ANGIOPLASTY     stent 5 years ago  . ESOPHAGEAL DILATION N/A 08/14/2014   Procedure: ESOPHAGEAL DILATION;  Surgeon: Rogene Houston, MD;  [facility];  Service: Endoscopy;  Laterality: N/A;  . ESOPHAGEAL DILATION N/A 01/20/2018   Procedure: ESOPHAGEAL DILATION;  Surgeon: Rogene Houston, MD;   [facility];  Service: Endoscopy;  Laterality: N/A;  . ESOPHAGOGASTRODUODENOSCOPY  03/22/2012   Procedure: ESOPHAGOGASTRODUODENOSCOPY (EGD);  Surgeon: Rogene Houston, MD;  [facility];  Service: Endoscopy;  Laterality: N/A;  300  . ESOPHAGOGASTRODUODENOSCOPY N/A 08/14/2014   Procedure: ESOPHAGOGASTRODUODENOSCOPY (EGD);  Surgeon: Rogene Houston, MD;  [facility];  Service: Endoscopy;  Laterality: N/A;  225  . ESOPHAGOGASTRODUODENOSCOPY (EGD) WITH PROPOFOL N/A 01/20/2018   Procedure: ESOPHAGOGASTRODUODENOSCOPY (EGD) WITH PROPOFOL;  Surgeon: Rogene Houston, MD;  [facility];  Service: Endoscopy;  Laterality: N/A;  . LEFT HEART CATH AND CORONARY ANGIOGRAPHY N/A 01/04/2019   Procedure: LEFT HEART CATH AND CORONARY ANGIOGRAPHY;  Surgeon: Jettie Booze, MD;  Location: [facility];  Service: Cardiovascular;  Laterality: N/A;   Review of Systems See HPI all other systems reviewed and are negative      Objective:   Physical Exam  BP 119/79   Pulse 65   Temp 98.1 F (36.7 C) (Oral)   Resp 18   Ht 5\' 5"  (1.651 m)   Wt 260 lb 11.2 oz (118.3 kg)   BMI 43.38 kg/m   General: 59 y.o. female well developed in NAD Eyes: sclera nonicteric, conjunctiva pink Neck: supple, no thyromegaly or lymphadenopathy Heart: RRR, no murmur Lungs: clear throughut Abdomen: obese, soft, nontender, no masses or HSM Rectal: thickened perianal skin with anterior and posterior anal fissures with tenderness, no active bleeding, anterior anal sentinel tag, no obvious abscess, no fistula. The gluteal dermis to the right and left of the anus with thinning, pink to translucent wrinkled appearance. 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 No evidence of herpetic lesions. Patient tolerated the exam without distress. Extremities:no edema    Assessment & Plan:   1. 58 y.o. 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 -stop using antibacterial soaps when bathing -general surgery consult to further evaluate anal fissures/anoscopy evaluation and fissure treatment, would also appreciate general surgery input regarding anal derm findings. If lichen sclerosus confirmed would treat with Clobetasol ointment or send to dermatology for treatment?  2. GERD, well controlled on Dexilant  3. NAFLD, cirrhosis as evidenced by CT findings and hypertensive portal gastropathy on EGD -patient to follow up in office in 2 months for appropriate cirrhosis follow up, will need labs and abd sono  4. CAD

## 2019-01-25 NOTE — Patient Instructions (Signed)
1. Refer to general surgeon for further evaluation regarding your anal fissures and perianal/gluteal skin disorder  2. Follow up in our office in 2 months and as needed

## 2019-02-05 ENCOUNTER — Telehealth (INDEPENDENT_AMBULATORY_CARE_PROVIDER_SITE_OTHER): Payer: Self-pay | Admitting: Nurse Practitioner

## 2019-02-05 NOTE — Telephone Encounter (Signed)
Patient left voice mail message stating when she was seen it was mentioned she may have a puss pocket but wasn't given any medication - she also stated she has not been feeling well - would like a call back at [phone]

## 2019-02-05 NOTE — Telephone Encounter (Signed)
I called patient, when she was seen in the office last week I did NOT assess an anal abscess. She stated she misunderstood and thought she had an abscess. No rectal pain. However, for the past 4 days she has been having diarrhea and lower abdominal pain. Since these are new symptoms, I advised she will need an ov for further evaluation and lab testing, most likely will need CT ABD/Pelvic. Advised pt to push fluids, bland diet. I advised pt to go to the ED if she has severe abd pain.   Mitzie, pls call patient to schedule office visit asap.

## 2019-02-06 ENCOUNTER — Telehealth: Payer: Self-pay | Admitting: Cardiovascular Disease

## 2019-02-06 NOTE — Telephone Encounter (Signed)
Virtual Visit Pre-Appointment Phone Call  "(Name), I am calling you today to discuss your upcoming appointment. We are currently trying to limit exposure to the virus that causes COVID-19 by seeing patients at home rather than in the office."  1. "What is the BEST phone number to call the day of the visit?" -  [phone]  2. Do you have or have access to (through a family member/friend) a smartphone with video capability that we can use for your visit?" a. If yes - list this number in appt notes as cell (if different from BEST phone #) and list the appointment type as a VIDEO visit in appointment notes b. If no - list the appointment type as a PHONE visit in appointment notes  3. Confirm consent - "In the setting of the current Covid19 crisis, you are scheduled for a (phone or video) visit with your provider on (date) at (time).  Just as we do with many in-office visits, in order for you to participate in this visit, we must obtain consent.  If you'd like, I can send this to your mychart (if signed up) or email for you to review.  Otherwise, I can obtain your verbal consent now.  All virtual visits are billed to your insurance company just like a normal visit would be.  By agreeing to a virtual visit, we'd like you to understand that the technology does not allow for your provider to perform an examination, and thus may limit your provider's ability to fully assess your condition. If your provider identifies any concerns that need to be evaluated in person, we will make arrangements to do so.  Finally, though the technology is pretty good, we cannot assure that it will always work on either your or our end, and in the setting of a video visit, we may have to convert it to a phone-only visit.  In either situation, we cannot ensure that we have a secure connection.  Are you willing to proceed?" STAFF: Did the patient verbally acknowledge consent to telehealth visit? Document YES/NO here: YES    4. Advise patient to be prepared - "Two hours prior to your appointment, go ahead and check your blood pressure, pulse, oxygen saturation, and your weight (if you have the equipment to check those) and write them all down. When your visit starts, your provider will ask you for this information. If you have an Apple Watch or Kardia device, please plan to have heart rate information ready on the day of your appointment. Please have a pen and paper handy nearby the day of the visit as well."  5. Give patient instructions for MyChart download to smartphone OR [email or web address] as below if video visit (depending on what platform provider is using)  6. Inform patient they will receive a phone call 15 minutes prior to their appointment time (may be from unknown caller ID) so they should be prepared to answer    TELEPHONE CALL NOTE  Amber Fischer has been deemed a candidate for a follow-up tele-health visit to limit community exposure during the Covid-19 pandemic. I spoke with the patient via phone to ensure availability of phone/video source, confirm preferred email & phone number, and discuss instructions and expectations.  I reminded Amber Fischer to be prepared with any vital sign and/or heart rhythm information that could potentially be obtained via home monitoring, at the time of her visit. I reminded Amber Fischer to expect a phone call prior to her visit.  Lynnda Child Slaughter 02/06/2019 4:07 PM   INSTRUCTIONS FOR DOWNLOADING THE MYCHART APP TO SMARTPHONE  - The patient must first make sure to have activated MyChart and know their login information - If Apple, go to [employer] and type in MyChart in the search bar and download the app. If Android, ask patient to go to [employer] and type in [place] in the search bar and download the app. The app is free but as with any other app downloads, their phone may require them to verify saved payment information or Apple/Android password.  - The  patient will need to then log into the app with their MyChart username and password, and select [facility] as their healthcare provider to link the account. When it is time for your visit, go to the MyChart app, find appointments, and click Begin Video Visit. Be sure to Select Allow for your device to access the Microphone and Camera for your visit. You will then be connected, and your provider will be with you shortly.  **If they have any issues connecting, or need assistance please contact MyChart service desk [phone])**  **If using a computer, in order to ensure the best quality for their visit they will need to use either of the following Internet Browsers: [employer], or Google Chrome**  IF USING DOXIMITY or DOXY.ME - The patient will receive a link just prior to their visit by text.     FULL LENGTH CONSENT FOR TELE-HEALTH VISIT   I hereby voluntarily request, consent and authorize [facility] and its employed or contracted physicians, physician assistants, nurse practitioners or other licensed health care professionals (the Practitioner), to provide me with telemedicine health care services (the Services") as deemed necessary by the treating Practitioner. I acknowledge and consent to receive the Services by the Practitioner via telemedicine. I understand that the telemedicine visit will involve communicating with the Practitioner through live audiovisual communication technology and the disclosure of certain medical information by electronic transmission. I acknowledge that I have been given the opportunity to request an in-person assessment or other available alternative prior to the telemedicine visit and am voluntarily participating in the telemedicine visit.  I understand that I have the right to withhold or withdraw my consent to the use of telemedicine in the course of my care at any time, without affecting my right to future care or treatment, and that the  Practitioner or I may terminate the telemedicine visit at any time. I understand that I have the right to inspect all information obtained and/or recorded in the course of the telemedicine visit and may receive copies of available information for a reasonable fee.  I understand that some of the potential risks of receiving the Services via telemedicine include:   Delay or interruption in medical evaluation due to technological equipment failure or disruption;  Information transmitted may not be sufficient (e.g. poor resolution of images) to allow for appropriate medical decision making by the Practitioner; and/or   In rare instances, security protocols could fail, causing a breach of personal health information.  Furthermore, I acknowledge that it is my responsibility to provide information about my medical history, conditions and care that is complete and accurate to the best of my ability. I acknowledge that Practitioner's advice, recommendations, and/or decision may be based on factors not within their control, such as incomplete or inaccurate data provided by me or distortions of diagnostic images or specimens that may result from electronic transmissions. I understand that the  practice of medicine is not an Chief Strategy Officer and that Practitioner makes no warranties or guarantees regarding treatment outcomes. I acknowledge that I will receive a copy of this consent concurrently upon execution via email to the email address I last provided but may also request a printed copy by calling the office of [facility].    I understand that my insurance will be billed for this visit.   I have read or had this consent read to me.  I understand the contents of this consent, which adequately explains the benefits and risks of the Services being provided via telemedicine.   I have been provided ample opportunity to ask questions regarding this consent and the Services and have had my questions answered to my  satisfaction.  I give my informed consent for the services to be provided through the use of telemedicine in my medical care  By participating in this telemedicine visit I agree to the above.

## 2019-02-09 ENCOUNTER — Encounter: Payer: Self-pay | Admitting: Cardiovascular Disease

## 2019-02-09 ENCOUNTER — Telehealth (INDEPENDENT_AMBULATORY_CARE_PROVIDER_SITE_OTHER): Payer: Medicaid Other | Admitting: Cardiovascular Disease

## 2019-02-09 VITALS — Ht 69.5 in

## 2019-02-09 DIAGNOSIS — R079 Chest pain, unspecified: Secondary | ICD-10-CM | POA: Diagnosis not present

## 2019-02-09 DIAGNOSIS — I10 Essential (primary) hypertension: Secondary | ICD-10-CM

## 2019-02-09 DIAGNOSIS — I25118 Atherosclerotic heart disease of native coronary artery with other forms of angina pectoris: Secondary | ICD-10-CM

## 2019-02-09 DIAGNOSIS — E785 Hyperlipidemia, unspecified: Secondary | ICD-10-CM

## 2019-02-09 DIAGNOSIS — Z955 Presence of coronary angioplasty implant and graft: Secondary | ICD-10-CM

## 2019-02-09 MED ORDER — ISOSORBIDE MONONITRATE ER 60 MG PO TB24: 90.0000 mg | ORAL_TABLET | Freq: Every day | ORAL | 6 refills | Status: DC

## 2019-02-09 NOTE — Addendum Note (Signed)
Addended by: Merlene Laughter on: 02/09/2019 11:14 AM   Modules accepted: Orders

## 2019-02-09 NOTE — Progress Notes (Signed)
Virtual Visit via Telephone Note   This visit type was conducted due to national recommendations for restrictions regarding the COVID-19 Pandemic (e.g. social distancing) in an effort to limit this patient's exposure and mitigate transmission in our community.  Due to her co-morbid illnesses, this patient is at least at moderate risk for complications without adequate follow up.  This format is felt to be most appropriate for this patient at this time.  The patient did not have access to video technology/had technical difficulties with video requiring transitioning to audio format only (telephone).  All issues noted in this document were discussed and addressed.  No physical exam could be performed with this format.  Please refer to the patient's chart for her  consent to telehealth for [facility].   Date:  02/09/2019   ID:  Amber Fischer, DOB 07/20/1960, MRN [id number]  Patient Location: Home Provider Location: Office  PCP:  Neale Burly, MD  Cardiologist:  Kate Sable, MD  Electrophysiologist:  None   Evaluation Performed:  Follow-Up Visit  Chief Complaint:  CAD.  History of Present Illness:    Amber Fischer is a 58 y.o. female with coronary artery disease.  She underwent cardiac catheterization on 01/04/2019 which showed a widely patent previously placed proximal RCA stent.  There is otherwise a 40% proximal circumflex stenosis and a distal 30% LAD stenosis.  LVEDP was 15 mmHg.  LVEF was normal, 55 to 65%.  She also has a history of hyperlipidemia, COPD, depression, fibromyalgia, GERD, hypertension, hypothyroidism, diabetes mellitus, obesity, and sleep apnea. She is a former smoker and quit in October 2012.  She is still having sharp chest pains in the retrosternal region.  She has an anal fissure and some sores on her buttocks. She also complains of stomach pain.  The patient does not have symptoms concerning for COVID-19 infection (fever, chills, cough, or new  shortness of breath).    Past Medical History:  Diagnosis Date  . Asthma   . Back pain, chronic   . Cirrhosis of liver ([place])   . COPD (chronic obstructive pulmonary disease) ([place])   . Coronary atherosclerosis of native coronary artery    Prior stent RCA - patent May 2014 with otherwise nonobstructive disease  . Epigastric abdominal pain 08/19/2010  . Essential hypertension, benign   . Fibromyalgia   . OCD (obsessive compulsive disorder)   . Rectal bleed 11/02/2010  . Sciatica   . Sleep apnea   . Type 2 diabetes mellitus ([place])    Past Surgical History:  Procedure Laterality Date  . ABDOMINAL HYSTERECTOMY    . CHOLECYSTECTOMY    . COLONOSCOPY  11/30/2010  . COLONOSCOPY W/ BIOPSIES  12/24/2010   NUR  . COLONOSCOPY WITH PROPOFOL N/A 01/20/2018   Procedure: COLONOSCOPY WITH PROPOFOL;  Surgeon: Rogene Houston, MD;  [facility];  Service: Endoscopy;  Laterality: N/A;  12:40  . COLONOSCOPY, ESOPHAGOGASTRODUODENOSCOPY (EGD) AND ESOPHAGEAL DILATION N/A 04/05/2013   Procedure: COLONOSCOPY, ESOPHAGOGASTRODUODENOSCOPY (EGD) AND ESOPHAGEAL DILATION;  Surgeon: Rogene Houston, MD;  [facility];  Service: Endoscopy;  Laterality: N/A;  . CORONARY ANGIOPLASTY     stent 5 years ago  . ESOPHAGEAL DILATION N/A 08/14/2014   Procedure: ESOPHAGEAL DILATION;  Surgeon: Rogene Houston, MD;  [facility];  Service: Endoscopy;  Laterality: N/A;  . ESOPHAGEAL DILATION N/A 01/20/2018   Procedure: ESOPHAGEAL DILATION;  Surgeon: Rogene Houston, MD;  [facility];  Service: Endoscopy;  Laterality: N/A;  . ESOPHAGOGASTRODUODENOSCOPY  03/22/2012   Procedure: ESOPHAGOGASTRODUODENOSCOPY (EGD);  Surgeon: Rogene Houston, MD;  [facility];  Service: Endoscopy;  Laterality: N/A;  300  . ESOPHAGOGASTRODUODENOSCOPY N/A 08/14/2014   Procedure: ESOPHAGOGASTRODUODENOSCOPY (EGD);  Surgeon: Rogene Houston, MD;  [facility];  Service: Endoscopy;   Laterality: N/A;  225  . ESOPHAGOGASTRODUODENOSCOPY (EGD) WITH PROPOFOL N/A 01/20/2018   Procedure: ESOPHAGOGASTRODUODENOSCOPY (EGD) WITH PROPOFOL;  Surgeon: Rogene Houston, MD;  [facility];  Service: Endoscopy;  Laterality: N/A;  . LEFT HEART CATH AND CORONARY ANGIOGRAPHY N/A 01/04/2019   Procedure: LEFT HEART CATH AND CORONARY ANGIOGRAPHY;  Surgeon: Jettie Booze, MD;  Location: [facility];  Service: Cardiovascular;  Laterality: N/A;     Current Meds  Medication Sig  . albuterol (PROAIR HFA) 108 (90 BASE) MCG/ACT inhaler Inhale 2 puffs into the lungs every 6 (six) hours as needed for wheezing or shortness of breath.  Marland Kitchen aspirin EC 81 MG tablet Take 81 mg by mouth daily.   . Biotin 300 MCG TABS Take 300 mcg by mouth every morning.   Marland Kitchen dexlansoprazole (DEXILANT) 60 MG capsule Take 1 capsule (60 mg total) by mouth daily before breakfast.  . diltiazem 2 % GEL Apply 1 application topically 2 (two) times daily.  . Evolocumab (REPATHA SURECLICK) XX123456 MG/ML SOAJ Inject 140 mg into the skin every 14 (fourteen) days.  Marland Kitchen HYDROcodone-acetaminophen (NORCO) 7.5-325 MG tablet Take 1 tablet by mouth 3 (three) times daily as needed (pain).   . insulin aspart (NOVOLOG) 100 UNIT/ML injection Inject 3-6 Units into the skin 3 (three) times daily before meals. Per Sliding Scale   . isosorbide mononitrate (IMDUR) 60 MG 24 hr tablet Take 1 tablet (60 mg total) by mouth daily.  Marland Kitchen levothyroxine (SYNTHROID, LEVOTHROID) 125 MCG tablet Take 125 mcg by mouth daily before breakfast.   . loratadine (CLARITIN) 10 MG tablet Take 10 mg by mouth daily.  . metFORMIN (GLUCOPHAGE) 1000 MG tablet Take 1,000 mg by mouth 2 (two) times daily with a meal.   . metoprolol tartrate (LOPRESSOR) 50 MG tablet Take 1 tablet (50 mg total) by mouth 2 (two) times daily.  . nitroGLYCERIN (NITROSTAT) 0.4 MG SL tablet Place 1 tablet (0.4 mg total) under the tongue every 5 (five) minutes as needed for chest pain.  Marland Kitchen  nystatin cream (MYCOSTATIN) Apply 1 application topically 2 (two) times daily as needed (applied to affected area of buttocks).  Marland Kitchen tiotropium (SPIRIVA HANDIHALER) 18 MCG inhalation capsule Place 1 capsule (18 mcg total) into inhaler and inhale daily. (Patient taking differently: Place 18 mcg into inhaler and inhale daily as needed (for shortness of breath). )  . topiramate (TOPAMAX) 50 MG tablet Take 50 mg by mouth 2 (two) times daily.   Current Facility-Administered Medications for the 02/09/19 encounter (Telemedicine) with Herminio Commons, MD  Medication  . sodium chloride flush (NS) 0.9 % injection 3 mL     Allergies:   Ciprofloxacin, Sulfonamide derivatives, Bee venom, and Pravastatin   Social History   Tobacco Use  . Smoking status: Former Smoker    Packs/day: 0.50    Years: 20.00    Pack years: 10.00    Types: Cigarettes    Start date: 08/16/1977    Quit date: 09/21/2011    Years since quitting: 7.3  . Smokeless tobacco: Never Used  . Tobacco comment: Quit 37yr ago after smoking 12 yrs  Substance Use Topics  . Alcohol  use: Never    Alcohol/week: 0.0 standard drinks    Frequency: Never  . Drug use: No     Family Hx: The patient's family history includes Autoimmune disease in her daughter; Diabetes in her father and son; Fibromyalgia in her daughter, daughter, mother, and son; Heart attack in her father; Kidney disease in her father; Memory loss in her mother; Multiple sclerosis in her sister; Stroke in her mother.  ROS:   Please see the history of present illness.     All other systems reviewed and are negative.   Prior CV studies:   The following studies were reviewed today:  See above  Labs/Other Tests and Data Reviewed:    EKG:  No ECG reviewed.  Recent Labs: 06/27/2018: ALT 58 01/01/2019: BUN 15; Creat 0.81; Hemoglobin 13.8; Platelets 162; Potassium 4.1; Sodium 142   Recent Lipid Panel Lab Results  Component Value Date/Time   CHOL 199 12/01/2018 09:27 AM    TRIG 150 (H) 12/01/2018 09:27 AM   HDL 55 12/01/2018 09:27 AM   CHOLHDL 3.6 12/01/2018 09:27 AM   LDLCALC 118 (H) 12/01/2018 09:27 AM    Wt Readings from Last 3 Encounters:  01/25/19 260 lb 11.2 oz (118.3 kg)  12/27/18 270 lb (122.5 kg)  08/29/18 262 lb 4.8 oz (119 kg)     Objective:    Vital Signs:  Ht 5' 9.5" (1.765 m)   BMI 37.95 kg/m    VITAL SIGNS:  reviewed  ASSESSMENT & PLAN:    1.  Coronary artery disease: Cardiac catheterization reviewed above with widely patent RCA stent.  Otherwise nonobstructive disease.  Continue aspirin, Lopressor, Imdur (I will increase to 90 mg), and Repatha.  2.  Hypertension: No changes to therapy.  3. Hyperlipidemia: LDL 118 on 12/01/2018.  She is on Repatha. She is not able to tolerate statin therapy reportedly due to liver dysfunction.She had beenon Zetia.I wonder if she would be able to tolerate low-dose statin therapy 2 or 3 days/week in order to reduce LDL. I will repeat lipids in 3 months.  4. Chest pain: Noncardiac. Likely GI in etiology. She underwent esophageal dilatation in August 2019. She has dysphagia with potatoes and french fries. I will see if increasing Imdur to 90 mg helps.    COVID-19 Education: The signs and symptoms of COVID-19 were discussed with the patient and how to seek care for testing (follow up with PCP or arrange E-visit).  The importance of social distancing was discussed today.  Time:   Today, I have spent 15 minutes with the patient with telehealth technology discussing the above problems.     Medication Adjustments/Labs and Tests Ordered: Current medicines are reviewed at length with the patient today.  Concerns regarding medicines are outlined above.   Tests Ordered: No orders of the defined types were placed in this encounter.   Medication Changes: No orders of the defined types were placed in this encounter.   Follow Up:  Virtual Visit or In Person in 1 year(s)  Signed, Kate Sable, MD  02/09/2019 10:56 AM    [place]

## 2019-02-09 NOTE — Patient Instructions (Addendum)
Medication Instructions:   Your physician has recommended you make the following change in your medication:   Increase isosorbide mononitrate to 90 mg by mouth daily. Please take 1&1/2 of your 60 mg tablets daily  Continue all other medications the same  Labwork:  Your physician recommends that you return for a FASTING lipid profile: in 3 months. Please do not eat or drink for at least 8 hours when you have this done. Your lab order has been been mailed to you. You may have this done at [facility] or [facility] or [facility] in [place].  Testing/Procedures:  NONE  Follow-Up:  Your physician recommends that you schedule a follow-up appointment in: 1 year. You will receive a reminder letter in the mail in about 10 months reminding you to call and schedule your appointment. If you don't receive this letter, please contact our office.   Any Other Special Instructions Will Be Listed Below (If Applicable).  If you need a refill on your cardiac medications before your next appointment, please call your pharmacy.

## 2019-02-20 ENCOUNTER — Telehealth (INDEPENDENT_AMBULATORY_CARE_PROVIDER_SITE_OTHER): Payer: Self-pay | Admitting: *Deleted

## 2019-02-20 NOTE — Telephone Encounter (Signed)
Patient saw Dr Sherrie Sport today for places on her butt, Dr Sherrie Sport told her it was a bad case of yeast he also told her she needed to see proctologist about the fissure, she asked me to cancel appt with Dr Constance Haw as Dr Sherrie Sport was going to set her up appt with proctologist

## 2019-02-27 IMAGING — DX DG LUMBAR SPINE COMPLETE 4+V
5 series · 5 of 5 positions shown · non-contrast
Comparison: CT scan of December 27, 2017.

CLINICAL DATA: Low back pain after multiple falls.

EXAM:
LUMBAR SPINE - COMPLETE 4+ VIEW

[l-spine ap]
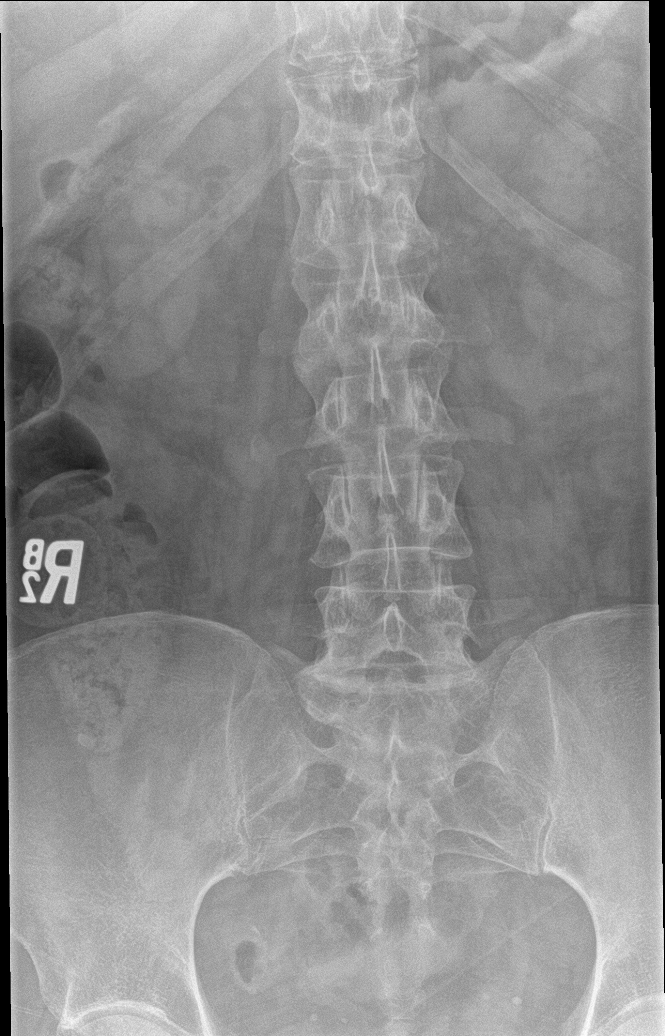

[l-spine obl (1 of 2)]
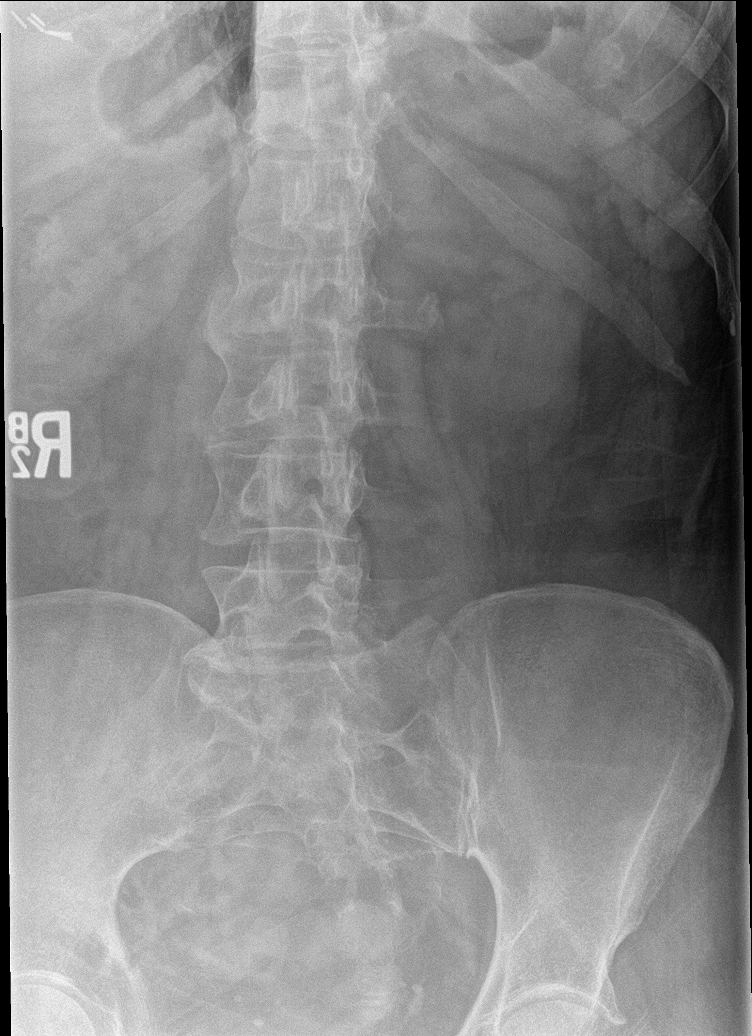

[l-spine obl (2 of 2)]
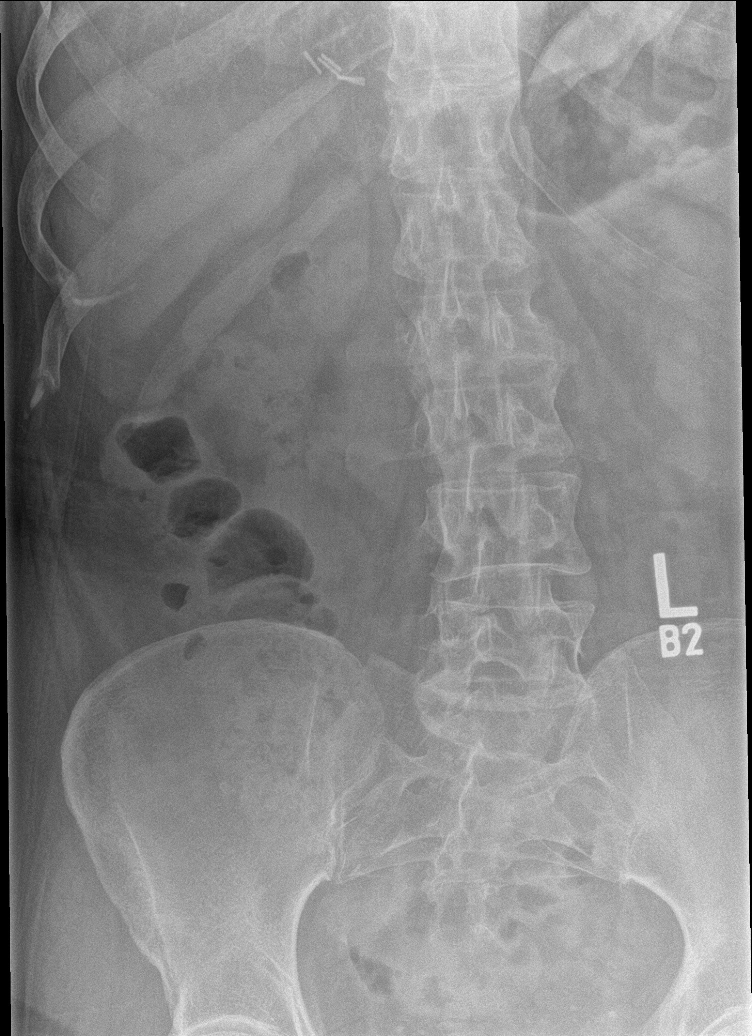

[l-spine lat]
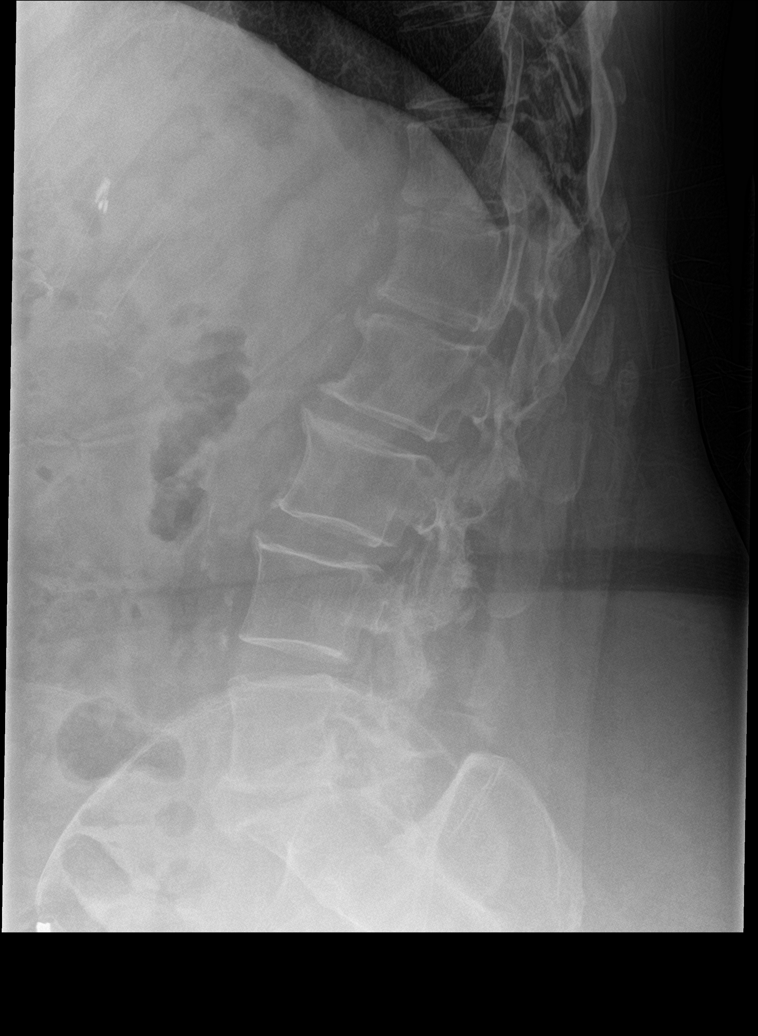

[l-spine spot]
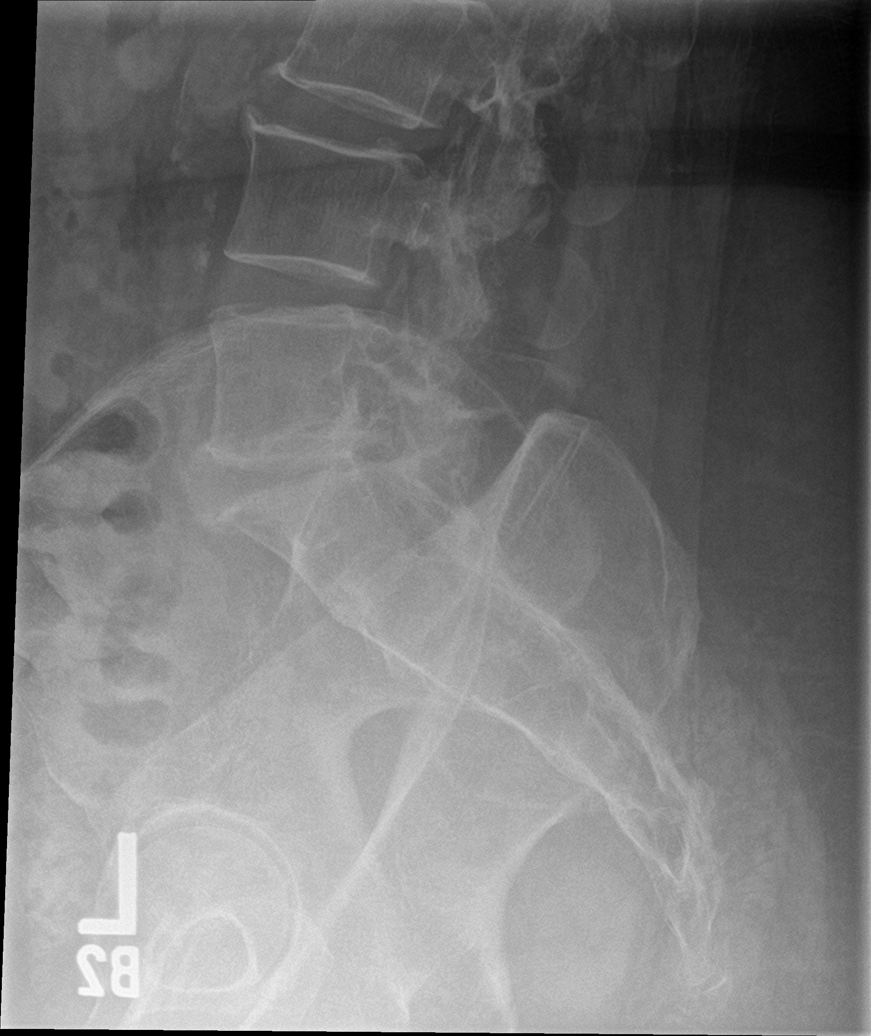

[5 of 5 positions shown; findings below may reference images not displayed]

FINDINGS: No fracture or spondylolisthesis is noted. Mild degenerative disc
disease is noted at L2-3 and L5-S1 with anterior osteophyte
formation. Atherosclerosis of abdominal aorta is noted.
IMPRESSION: Mild multilevel degenerative disc disease. No acute abnormality seen
in the lumbar spine.

## 2019-03-01 ENCOUNTER — Ambulatory Visit: Payer: Medicaid Other | Admitting: General Surgery

## 2019-03-05 NOTE — Telephone Encounter (Signed)
Noted and I have no objection to her seeing a proctologist

## 2019-03-06 ENCOUNTER — Ambulatory Visit (INDEPENDENT_AMBULATORY_CARE_PROVIDER_SITE_OTHER): Payer: Medicaid Other | Admitting: Internal Medicine

## 2019-03-25 NOTE — Progress Notes (Deleted)
   Subjective:    Patient ID: Amber Fischer, female    DOB: 02/23/1961, 58 y.o.   MRN: [id number]  HPI Amber Fischer is a 58 year old female with a PMH of CAD with 1 coronary stent, angina, HTN, DM II, COPD, asthma, NAFLD with questionable early cirrhosis, GERD and dysphagia requiring esophageal dilatation. She was seen in the office on for anal itchiness. She was advised to follow up in the office for cirrhosis evaluation.   Her most recent colonoscopy was 01/20/2018 which showed an anal fissure treated with diltiazem gel. The colon was normal. A repeat colonoscopy advised in 10 years.  An EGD was done on the same date which showed a normal esophagus, no EV, a 2 cm HH, no endoscopic esophageal abnormality to explain patient's dysphagia, the esophagus was dilated with a Maloney dilator 44 F, a few gastric polyps and portal hypertensive gastropathy was present.  01/01/2019 WBC 5.4. Hg 12.8. HCT 41.7. PLT 162 (12/2017 PLT 203) 06/27/2018 AST 50. ALT 58.  01/20/2018 Hep B surf ag and Hep C ab 01/20/2018 negative. SMA, AMA and ANA normal.  CTAP w/contrast 12/27/2017: 1. CT findings consistent with cirrhotic changes involving the liver with portal venous hypertension and portal venous collaterals but no splenomegaly or ascites. No worrisome hepatic lesions. 2. Status post cholecystectomy. No biliary dilatation. 3. No acute abdominal/pelvic findings, mass lesions or lymphadenopathy. 4. Moderate stool throughout the colon suggesting constipation  S/P cardiac catheterization 01/04/2019:  Previously placed Prox RCA stent (unknown type) is widely patent.  Ost RCA to Prox RCA lesion is 20% stenosed.  Prox Cx lesion is 40% stenosed.  Dist LAD lesion is 30% stenosed.  The left ventricular systolic function is normal.  LV end diastolic pressure is normal. LVEDP 15 mm Hg.  The left ventricular ejection fraction is 55-65% by visual estimate.  There is no aortic valve stenosis.  Unable to onbtain right  radial access.   Review of Systems     Objective:   Physical Exam        Assessment & Plan:

## 2019-03-27 ENCOUNTER — Ambulatory Visit (INDEPENDENT_AMBULATORY_CARE_PROVIDER_SITE_OTHER): Payer: Medicaid Other | Admitting: Nurse Practitioner

## 2019-05-11 ENCOUNTER — Other Ambulatory Visit (INDEPENDENT_AMBULATORY_CARE_PROVIDER_SITE_OTHER): Payer: Self-pay | Admitting: Internal Medicine

## 2019-05-11 NOTE — Telephone Encounter (Signed)
Patient will be given a 3 month supply of Dexilant , she will need OV with Provider prior to further refills or she may get from her PCP.  She has cancelled her last several appointments.

## 2019-05-27 NOTE — Progress Notes (Addendum)
Virtual Visit via Video Note The purpose of this virtual visit is to provide medical care while limiting exposure to the novel coronavirus.    Consent was obtained for video visit:  Yes Answered questions that patient had about telehealth interaction:  Yes I discussed the limitations, risks, security and privacy concerns of performing an evaluation and management service by telemedicine. I also discussed with the patient that there may be a patient responsible charge related to this service. The patient expressed understanding and agreed to proceed.  Pt location: Home Physician Location: office Name of referring provider:  Neale Burly, MD I connected with Amber Fischer at patients initiation/request on 05/28/2019 at  9:10 AM EST by video enabled telemedicine application and verified that I am speaking with the correct person using two identifiers. Pt MRN:  [id number] Pt DOB:  1960-07-01 Video Participants:  Amber Fischer; her daughter   History of Present Illness:  Amber Fischer is a 58 year old female with CAD, COPD, HTN, type 2 diabetes mellitus, fibromyalgia, hypothyroidism, HLD and OSA who presents for memory problems.  She and her PCP believe that she may have ADD because she has trouble concentrating.  She starts talking but cannot remain focused.  She started having memory problems about a year and a half ago.  She forgets questions or conversations.  She has trouble remembering details when she tells stories.  She is disoriented when driving on familiar routes.  She is able to perform ADLs.  Her daughter has been helping her get her groceries.  Her daughter helps balance her checkbook and assists in paying bills.    She has untreated OSA. She needed a new CPAP but never was set up for it.    Family history:  Sister has multiple sclerosis, daughter has a unspecified mixed-connective tissue disease, son has scleroderma, mother had TIAs, maternal great-grandmother and great  great-grandmother had dementia.  EEG from 04/20/2017 was normal.  Personal History:  GED. Worked until 2002 when she was put on disability due to back injury.  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     Labs: 01/01/2019:  CBC with WBC 5.4, HGB 13.8, HCT 41.7, PLT 162; BMP with Na 142, K 4.1, Cl 110, CO2 24, glucose 116, BUN 15, Cr 0.81.    Past Medical History: Past Medical History:  Diagnosis Date  . Asthma   . Back pain, chronic   . Cirrhosis of liver ([place])   . COPD (chronic obstructive pulmonary disease) ([place])   . Coronary atherosclerosis of native coronary artery    Prior stent RCA - patent May 2014 with otherwise nonobstructive disease  . Epigastric abdominal pain 08/19/2010  . Essential hypertension, benign   . Fibromyalgia   . OCD (obsessive compulsive disorder)   . Rectal bleed 11/02/2010  . Sciatica   . Sleep apnea   . Type 2 diabetes mellitus ([facility])     Medications: Outpatient Encounter Medications as of 05/28/2019  Medication Sig Note  . albuterol (PROAIR HFA) 108 (90 BASE) MCG/ACT inhaler Inhale 2 puffs into the lungs every 6 (six) hours as needed for wheezing or shortness of breath.   Marland Kitchen aspirin EC 81 MG tablet Take 81 mg by mouth daily.    . Biotin 300 MCG TABS Take 300 mcg by mouth every morning.    Marland Kitchen DEXILANT 60 MG capsule TAKE 1 CAPSULE BY MOUTH EVERY DAY BEFORE BREAKFAST   . diltiazem 2 % GEL Apply 1  application topically 2 (two) times daily.   . Evolocumab (REPATHA SURECLICK) [id number] MG/ML SOAJ Inject 140 mg into the skin every 14 (fourteen) days.   Marland Kitchen HYDROcodone-acetaminophen (NORCO) 7.5-325 MG tablet Take 1 tablet by mouth 3 (three) times daily as needed (pain).  01/25/2019: Patient states that she now takes 2 by mouth daily.  . insulin aspart (NOVOLOG) 100 UNIT/ML injection Inject 3-6 Units into the skin 3 (three) times daily before meals. Per Sliding Scale    . isosorbide mononitrate (IMDUR) 60 MG 24 hr tablet Take 1.5 tablets (90 mg total) by  mouth daily.   Marland Kitchen levothyroxine (SYNTHROID, LEVOTHROID) 125 MCG tablet Take 125 mcg by mouth daily before breakfast.    . loratadine (CLARITIN) 10 MG tablet Take 10 mg by mouth daily.   . metFORMIN (GLUCOPHAGE) 1000 MG tablet Take 1,000 mg by mouth 2 (two) times daily with a meal.    . metoprolol tartrate (LOPRESSOR) 50 MG tablet Take 1 tablet (50 mg total) by mouth 2 (two) times daily.   . nitroGLYCERIN (NITROSTAT) 0.4 MG SL tablet Place 1 tablet (0.4 mg total) under the tongue every 5 (five) minutes as needed for chest pain.   Marland Kitchen nystatin cream (MYCOSTATIN) Apply 1 application topically 2 (two) times daily as needed (applied to affected area of buttocks).   Marland Kitchen tiotropium (SPIRIVA HANDIHALER) 18 MCG inhalation capsule Place 1 capsule (18 mcg total) into inhaler and inhale daily. (Patient taking differently: Place 18 mcg into inhaler and inhale daily as needed (for shortness of breath). )   . topiramate (TOPAMAX) 50 MG tablet Take 50 mg by mouth 2 (two) times daily.    Facility-Administered Encounter Medications as of 05/28/2019  Medication  . sodium chloride flush (NS) 0.9 % injection 3 mL    Allergies: Allergies  Allergen Reactions  . Ciprofloxacin Itching and Nausea And Vomiting  . Sulfonamide Derivatives Hives  . Bee Venom Swelling and Rash  . Pravastatin Rash    "swelling in legs/arms, bumps all over"     Family History: Family History  Problem Relation Age of Onset  . Stroke Mother   . Memory loss Mother   . Fibromyalgia Mother   . Diabetes Father   . Kidney disease Father   . Heart attack Father   . Multiple sclerosis Sister   . Autoimmune disease Daughter   . Fibromyalgia Daughter   . Fibromyalgia Daughter   . Fibromyalgia Son   . Diabetes Son     Social History: Social History   Socioeconomic History  . Marital status: Divorced    Spouse name: Not on file  . Number of children: 3  . Years of education: GED  . Highest education level: Not on file  Occupational  History  . Not on file  Social Needs  . Financial resource strain: Not on file  . Food insecurity    Worry: Not on file    Inability: Not on file  . Transportation needs    Medical: Not on file    Non-medical: Not on file  Tobacco Use  . Smoking status: Former Smoker    Packs/day: 0.50    Years: 20.00    Pack years: 10.00    Types: Cigarettes    Start date: 08/16/1977    Quit date: 09/21/2011    Years since quitting: 7.6  . Smokeless tobacco: Never Used  . Tobacco comment: Quit 75yr ago after smoking 12 yrs  Substance and Sexual Activity  . Alcohol use: Never  Alcohol/week: 0.0 standard drinks    Frequency: Never  . Drug use: No  . Sexual activity: Not on file  Lifestyle  . Physical activity    Days per week: Not on file    Minutes per session: Not on file  . Stress: Not on file  Relationships  . Social Herbalist on phone: Not on file    Gets together: Not on file    Attends religious service: Not on file    Active member of club or organization: Not on file    Attends meetings of clubs or organizations: Not on file    Relationship status: Not on file  . Intimate partner violence    Fear of current or ex partner: Not on file    Emotionally abused: Not on file    Physically abused: Not on file    Forced sexual activity: Not on file  Other Topics Concern  . Not on file  Social History Narrative   Lives at home by herself   Her daughter manages her medications as much as she can   Right handed   Drinks 1-2 cups caffeine daily       Observations/Objective:   Height 5\' 9"  (1.753 m), weight 269 lb (122 kg). No acute distress.  Alert and oriented.  Speech fluent and not dysarthric.  Delayed recall poor.  Remote memory mostly intact.  Language intact.  Montreal Cognitive Assessment Blind 05/28/2019  Attention: Read list of digits (0/2) 1  Attention: Read list of letters (0/1) 1  Attention: Serial 7 subtraction starting at 100 (0/3) 1  Language: Repeat  phrase (0/2) 1  Language : Fluency (0/1) 1  Abstraction (0/2) 2  Delayed Recall (0/5) 2  Orientation (0/6) 6  Total 15  Adjusted Score (based on education) 16   Eyes orthophoric on primary gaze.  Face symmetric.  Assessment and Plan:   Major neurocognitive disorder (has lost some function of independence, particularly in regards to finances.  May be early Alzheimer's but also can be complicated by untreated OSA.  1.  Will first check MRI of brain without contrast 2.  Will check B12 and TSH 3.  Will refer for neuropsychological testing 4.  Follow up after testing.  Follow Up Instructions:    -I discussed the assessment and treatment plan with the patient. The patient was provided an opportunity to ask questions and all were answered. The patient agreed with the plan and demonstrated an understanding of the instructions.   The patient was advised to call back or seek an in-person evaluation if the symptoms worsen or if the condition fails to improve as anticipated.   Dudley Major, DO

## 2019-05-28 ENCOUNTER — Telehealth (INDEPENDENT_AMBULATORY_CARE_PROVIDER_SITE_OTHER): Payer: Medicaid Other | Admitting: Neurology

## 2019-05-28 ENCOUNTER — Other Ambulatory Visit: Payer: Self-pay

## 2019-05-28 ENCOUNTER — Encounter: Payer: Self-pay | Admitting: Neurology

## 2019-05-28 VITALS — Ht 69.0 in | Wt 269.0 lb

## 2019-05-28 DIAGNOSIS — F0150 Vascular dementia without behavioral disturbance: Secondary | ICD-10-CM

## 2019-05-28 DIAGNOSIS — F0390 Unspecified dementia without behavioral disturbance: Secondary | ICD-10-CM

## 2019-05-28 DIAGNOSIS — G4733 Obstructive sleep apnea (adult) (pediatric): Secondary | ICD-10-CM

## 2019-05-28 NOTE — Addendum Note (Signed)
Addended by: Ranae Plumber on: 05/28/2019 11:25 AM   Modules accepted: Orders

## 2019-05-28 NOTE — Addendum Note (Signed)
Addended by: Ranae Plumber on: 05/28/2019 11:18 AM   Modules accepted: Orders

## 2019-06-18 ENCOUNTER — Ambulatory Visit (HOSPITAL_COMMUNITY)
Admission: RE | Admit: 2019-06-18 | Discharge: 2019-06-18 | Disposition: A | Discharge status: Home / Self Care | Payer: Medicaid Other | Source: Ambulatory Visit | Attending: Neurology | Admitting: Neurology

## 2019-06-18 ENCOUNTER — Other Ambulatory Visit (HOSPITAL_COMMUNITY)
Admission: RE | Admit: 2019-06-18 | Discharge: 2019-06-18 | Disposition: A | Discharge status: Home / Self Care | Payer: Medicaid Other | Source: Ambulatory Visit | Attending: Neurology | Admitting: Neurology

## 2019-06-18 ENCOUNTER — Other Ambulatory Visit: Payer: Self-pay

## 2019-06-18 DIAGNOSIS — G4733 Obstructive sleep apnea (adult) (pediatric): Secondary | ICD-10-CM

## 2019-06-18 DIAGNOSIS — F0150 Vascular dementia without behavioral disturbance: Secondary | ICD-10-CM | POA: Diagnosis not present

## 2019-06-18 DIAGNOSIS — F0390 Unspecified dementia without behavioral disturbance: Secondary | ICD-10-CM

## 2019-06-18 LAB — TSH: TSH: 1.325 u[IU]/mL (ref 0.350–4.500)

## 2019-06-18 LAB — VITAMIN B12: Vitamin B-12: 193 pg/mL (ref 180–914)

## 2019-07-02 ENCOUNTER — Encounter: Payer: Self-pay | Admitting: Neurology

## 2019-07-04 ENCOUNTER — Other Ambulatory Visit: Payer: Self-pay

## 2019-07-04 ENCOUNTER — Telehealth (INDEPENDENT_AMBULATORY_CARE_PROVIDER_SITE_OTHER): Payer: Medicaid Other | Admitting: Neurology

## 2019-07-04 VITALS — Ht 69.0 in | Wt 260.0 lb

## 2019-07-04 DIAGNOSIS — E538 Deficiency of other specified B group vitamins: Secondary | ICD-10-CM

## 2019-07-04 DIAGNOSIS — F0150 Vascular dementia without behavioral disturbance: Secondary | ICD-10-CM

## 2019-07-04 DIAGNOSIS — G4733 Obstructive sleep apnea (adult) (pediatric): Secondary | ICD-10-CM

## 2019-07-04 DIAGNOSIS — F0390 Unspecified dementia without behavioral disturbance: Secondary | ICD-10-CM
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
## 2019-08-06 ENCOUNTER — Institutional Professional Consult (permissible substitution): Payer: Medicaid Other | Admitting: Neurology

## 2019-08-12 ENCOUNTER — Other Ambulatory Visit (INDEPENDENT_AMBULATORY_CARE_PROVIDER_SITE_OTHER): Payer: Self-pay | Admitting: Internal Medicine

## 2019-09-01 DIAGNOSIS — Z79891 Long term (current) use of opiate analgesic: Secondary | ICD-10-CM | POA: Insufficient documentation

## 2019-09-01 DIAGNOSIS — M797 Fibromyalgia: Secondary | ICD-10-CM | POA: Insufficient documentation

## 2019-09-01 DIAGNOSIS — M130 Polyarthritis, unspecified: Secondary | ICD-10-CM | POA: Insufficient documentation

## 2019-09-04 DIAGNOSIS — L439 Lichen planus, unspecified: Secondary | ICD-10-CM | POA: Insufficient documentation

## 2019-09-09 ENCOUNTER — Other Ambulatory Visit: Payer: Self-pay | Admitting: Cardiovascular Disease

## 2019-09-20 ENCOUNTER — Other Ambulatory Visit: Payer: Self-pay | Admitting: *Deleted

## 2019-09-20 MED ORDER — ISOSORBIDE MONONITRATE ER 30 MG PO TB24: 90.0000 mg | ORAL_TABLET | Freq: Every day | ORAL | 4 refills | Status: DC

## 2019-10-30 ENCOUNTER — Ambulatory Visit: Payer: Medicaid Other | Admitting: Psychology

## 2019-10-30 ENCOUNTER — Encounter: Payer: Self-pay | Admitting: Psychology

## 2019-10-30 ENCOUNTER — Other Ambulatory Visit: Payer: Self-pay

## 2019-10-30 DIAGNOSIS — R4189 Other symptoms and signs involving cognitive functions and awareness: Secondary | ICD-10-CM

## 2019-10-30 DIAGNOSIS — Z91410 Personal history of adult physical and sexual abuse: Secondary | ICD-10-CM | POA: Insufficient documentation

## 2019-10-30 DIAGNOSIS — F411 Generalized anxiety disorder: Secondary | ICD-10-CM

## 2019-10-30 DIAGNOSIS — G4733 Obstructive sleep apnea (adult) (pediatric): Secondary | ICD-10-CM

## 2019-10-30 DIAGNOSIS — F329 Major depressive disorder, single episode, unspecified: Secondary | ICD-10-CM | POA: Insufficient documentation

## 2019-10-30 DIAGNOSIS — F330 Major depressive disorder, recurrent, mild: Secondary | ICD-10-CM

## 2019-10-30 DIAGNOSIS — M797 Fibromyalgia: Secondary | ICD-10-CM

## 2019-10-30 NOTE — Progress Notes (Signed)
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 attributing them to ongoing hallucinations.   Tobacco: Denied. She reported quitting 8-9 years prior. Alcohol: She denied current alcohol consumption, as well as a history of problematic alcohol abuse or dependence.  Recreational drugs: Denied. Caffeine: She reported 1-2 cups of coffee/tea and/or a Coke beverage throughout the day.   Family History: Problem Relation Age of Onset  . Stroke Mother   . Memory loss Mother   . Fibromyalgia Mother   . Diabetes Father   . Kidney disease Father   . Heart attack Father   . Multiple sclerosis Sister   . Autoimmune disease Daughter   . Fibromyalgia Daughter   . Fibromyalgia Daughter   . Fibromyalgia Son   . Diabetes Son    This information was confirmed by Ms. Guier.  Academic/Vocational History: Highest level of educational attainment: 9 years. Ms. Chaiken completed the 9th grade before leaving school and getting married at the age of 21. She later went back and earned her GED. She described herself as a good (A/B) student in a majority of subjects, but reported some weaknesses (C's and D's) across certain subjects (grammar, history, social studies).  History of developmental delay:  Denied. History of grade repetition: Denied. Enrollment in special education courses: Denied. History of LD/ADHD: Denied.  Employment: She has received disability benefits since 2002 due to a back injury. 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   Evaluation Results:   Behavioral Observations: Ms. Stigall was accompanied by her daughter, arrived to her appointment on time, and was appropriately dressed and groomed. She appeared alert and oriented. Observed gait and station were within normal limits. Gross motor functioning appeared intact upon informal observation and no abnormal movements (e.g., tremors) were noted. Her affect was generally relaxed and positive, but did range appropriately given the subject being discussed during the clinical interview or the task at hand during testing procedures. She did express anxiety surrounding the testing portion of the current evaluation. Spontaneous speech was fluent and word finding difficulties were not observed during the clinical interview. Thought processes were coherent, organized, and normal in content. Insight into her cognitive difficulties appeared poor at times and her daughter appeared the more reliable historian. During testing, she appeared to lack confidence in her abilities and frequently exhibited an immediate reaction of "I don't know that." However, she was largely able to persist and meaningfully participate following encouragement provided by the examiner. Sustained attention was appropriate. Task engagement was adequate and she persisted when challenged. One task (D-KEFS 20 Questions) was discontinued due to her not understanding task instructions. Overall, Ms. Deluccia was cooperative with the clinical interview and subsequent testing procedures.   Adequacy of Effort: The validity of neuropsychological testing is limited by the extent to which the individual being tested may be assumed to  have exerted adequate effort during testing. Ms. Dumitrescu expressed her intention to perform to the best of her abilities and exhibited adequate  task engagement and persistence. Scores across stand-alone and embedded performance validity measures were within expectation. As such, the results of the current evaluation are believed to be a valid representation of Ms. Laszlo's current cognitive functioning.  Test Results: Ms. Tolson was fully oriented at the time of the current evaluation.  Intellectual abilities based upon educational and vocational attainment were estimated to be in the average range. Premorbid abilities were estimated to be within the average range based upon a single-word reading test.   Processing speed was below average to average. Basic attention was below average. More complex attention (e.g., working memory) was also below average. Executive functioning was variable, ranging from the well below average to average normative ranges. Particular weaknesses were exhibited across tasks requiring abstract reasoning and problem solving.  Assessed receptive language abilities were average. Sentence repetition was well below average, while performance on a semantic knowledge screening test was within normal limits. Assessed expressive language (e.g., verbal fluency and confrontation naming) was average to above average.     Assessed visuospatial/visuoconstructional abilities were variable. An isolated weaknesses was exhibited across a visual discrimination task. However, visual construction tasks were appropriate.    Learning (i.e., encoding) of novel verbal and visual information was exceptionally low to below average, representing a primary weakness across testing. Spontaneous delayed recall (i.e., retrieval) of previously learned information was below average. Retention rates were 100% across a story learning task, 57-86% across a list learning task, and 100% across a shape learning task.  Performance across recognition tasks was appropriate, suggesting evidence for information consolidation.   Results of emotional screening instruments suggested that recent symptoms of generalized anxiety were in the minimal range, while symptoms of depression were within the mild range. A screening instrument assessing recent sleep quality suggested the presence of moderate sleep dysfunction. Responses across a questionnaire assessing childhood symptoms of ADHD scored in the "unlikely to have ADHD" range across symptoms of both inattention and hyperactivity/impulsivity.   Tables of Scores:   Note: This summary of test scores accompanies the interpretive report and should not be considered in isolation without reference to the appropriate sections in the text. Descriptors are based on appropriate normative data and may be adjusted based on clinical judgment. The terms "impaired" and "within normal limits (WNL)" are used when a more specific level of functioning cannot be determined.       Effort Testing:   DESCRIPTOR       ACS Word Choice: --- --- Within Expectation  CVLT-III Forced Choice Recognition: --- --- Within Expectation  BVMT-R Retention Percentage: --- --- Within Expectation  D-KEFS Color Word Effort Index: --- --- Within Expectation       Orientation:      Raw Score Percentile   NAB Orientation, Form 1 29/29 --- ---       Intellectual Functioning:           Standard Score Percentile   Test of Premorbid Functioning: 95 37 Average       Memory:          Wechsler Memory Scale (WMS-IV):                       Raw Score (Scaled Score) Percentile     Logical Memory I 8/50 (2) <1 Exceptionally Low    Logical Memory II 11/50 (6) 9 Below Average    Logical Memory Recognition 17/30 <2 Exceptionally Low       [place] Verbal Learning Test (CVLT-III) Brief Form:  Raw Score (Scaled/Standard Score) Percentile     Total Trials 1-4 24/36 (84) 14 Below Average    Short-Delay Free Recall  5/9 (5) 5 Well Below Average    Long-Delay Free Recall 4/9 (6) 9 Below Average    Long-Delay Cued Recall 6/9 (7) 16 Below Average      Recognition Hits 8/9 (7) 16 Below Average      False Positive Errors 2 (6) 9 Below Average       Brief Visuospatial Memory Test (BVMT-R), Form 1: Raw Score (T Score) Percentile     Total Trials 1-3 11/36 (28) 2 Exceptionally Low    Delayed Recall 7/12 (42) 21 Below Average    Recognition Discrimination Index 5 11-16 Below Average      Recognition Hits 6/6 >16 Within Normal Limits      False Positive Errors 1 6-10 Well Below Average        Attention/Executive Function:          Trail Making Test (TMT): Raw Score (T Score) Percentile     Part A 35 secs.,  0 errors (48) 42 Average    Part B 150 secs.,  1 error (37) 9 Below Average         Scaled Score Percentile   WAIS-IV Coding: 6 9 Below Average       NAB Attention Module, Form 1: T Score Percentile     Digits Forward 37 9 Below Average    Digits Backwards 41 18 Below Average       D-KEFS Color-Word Interference Test: Raw Score (Scaled Score) Percentile     Color Naming 36 secs. (7) 16 Below Average    Word Reading 30 secs. (6) 9 Below Average    Inhibition 57 secs. (11) 63 Average      Total Errors 3 errors (8) 25 Average    Inhibition/Switching 83 secs. (8) 25 Average      Total Errors 7 errors (5) 5 Well Below Average       D-KEFS Verbal Fluency Test: Raw Score (Scaled Score) Percentile     Letter Total Correct 32 (9) 37 Average    Category Total Correct 45 (13) 84 Above Average    Category Switching Total Correct 12 (9) 37 Average    Category Switching Accuracy 11 (9) 37 Average      Total Set Loss Errors 2 (10) 50 Average      Total Repetition Errors 1 (12) 75 Above Average       D-KEFS 20 Questions Test: Scaled Score Percentile     Total Weighted Achievement Score Discontinued (comprehension) --- ---    Initial Abstraction Score --- --- ---       [place]consin Card Sorting Test  [facility]): Raw Score Percentile     Categories (trials) 1 (64) 6-10 Well Below Average    Total Errors 36 4 Well Below Average    Perseverative Errors 24 5 Well Below Average    Non-Perseverative Errors 12 18 Below Average    Failure to Maintain Set 0 --- ---       Language:           Raw Score Percentile   PPVT Screening Instrument: 15/16 --- Within Expectation  Sentence Repetition: 12/22 3 Well Below Average       Verbal Fluency Test: Raw Score (T Score) Percentile     Phonemic Fluency (FAS) 32 (43) 25 Average    Animal Fluency 19 (51) 54 Average  NAB Language Module, Form 1: T Score Percentile     Auditory Comprehension 46 34 Average    Naming 29/31 (45) 31 Average       Visuospatial/Visuoconstruction:      Raw Score Percentile   Clock Drawing: 10/10 --- Within Normal Limits       NAB Spatial Module, Form 1: T Score Percentile     Visual Discrimination 33 5 Well Below Average    Figure Drawing Copy 60 84 Above Average        Scaled Score Percentile   WAIS-IV Block Design: 10 50 Average  WAIS-IV Matrix Reasoning: 5 5 Well Below Average       Mood and Personality:      Raw Score Percentile   Geriatric Depression Scale: 17 --- Mild  Geriatric Anxiety Scale: 9 --- Minimal    Somatic 7 --- Mild    Cognitive 2 --- Minimal    Affective 0 --- Minimal       Additional Questionnaires:          Adult Self-Report Scale (Childhood): Raw Score Percentile     Inattention 0 --- Unlikely to have ADHD    Hyperactive/Impulsive 0 --- Unlikely to have ADHD       PROMIS Sleep Disturbance Questionnaire: 37 --- Moderate   Informed Consent and Coding/Compliance:   Ms. Greenia was provided with a verbal description of the nature and purpose of the present neuropsychological evaluation. Also reviewed were the foreseeable risks and/or discomforts and benefits of the procedure, limits of confidentiality, and mandatory reporting requirements of this provider. The patient was given the  opportunity to ask questions and receive answers about the evaluation. Oral consent to participate was provided by the patient.   This evaluation was conducted by Christia Reading, Ph.D., licensed clinical neuropsychologist. Ms. Feider completed a comprehensive clinical interview with Dr. Melvyn Novas, billed as one unit [phone], and 150 minutes of cognitive testing and scoring, billed as one unit [phone] and four additional units 96139. Psychometrist Milana Kidney, B.S., assisted Dr. Melvyn Novas with test administration and scoring procedures. As a separate and discrete service, Dr. Melvyn Novas spent a total of 180 minutes in interpretation and report writing billed as one unit [phone] and two units 96133.

## 2019-10-30 NOTE — Progress Notes (Addendum)
   Psychometrician Note   Cognitive testing was administered to Amber Fischer by Amber Fischer, B.S. (psychometrist) under the supervision of Dr. Christia Fischer, Ph.D., licensed psychologist on Oct 30, 2019. Amber Fischer did not appear overtly distressed by the testing session per behavioral observation or responses across self-report questionnaires. Dr. Christia Fischer, Ph.D. checked in with Amber Fischer as needed to manage any distress related to testing procedures (if applicable). Rest breaks were offered.    The battery of tests administered was selected by Dr. Christia Fischer, Ph.D. with consideration to Amber Fischer's current level of functioning, the nature of her symptoms, emotional and behavioral responses during interview, level of literacy, observed level of motivation/effort, and the nature of the referral question. This battery was communicated to the psychometrist. Communication between Dr. Christia Fischer, Ph.D. and the psychometrist was ongoing throughout the evaluation and Dr. Christia Fischer, Ph.D. was immediately accessible at all times. Dr. Christia Fischer, Ph.D. provided supervision to the psychometrist on the date of this service to the extent necessary to assure the quality of all services provided.    Amber Fischer will return within approximately 1-2 weeks for an interactive feedback session with Amber Fischer at which time her test performances, clinical impressions, and treatment recommendations will be reviewed in detail. Amber Fischer understands she can contact our office should she require our assistance before this time.  A total of 150 minutes of billable time were spent face-to-face with Amber Fischer by the psychometrist. This includes both test administration and scoring time. Billing for these services is reflected in the clinical report generated by Dr. Christia Fischer, Ph.D..  This note reflects time spent with the psychometrician and does not include test scores or any clinical  interpretations made by Amber Fischer. The full report will follow in a separate note.

## 2019-11-06 ENCOUNTER — Other Ambulatory Visit: Payer: Self-pay

## 2019-11-06 ENCOUNTER — Ambulatory Visit (INDEPENDENT_AMBULATORY_CARE_PROVIDER_SITE_OTHER): Payer: Medicaid Other | Admitting: Psychology

## 2019-11-06 DIAGNOSIS — M797 Fibromyalgia: Secondary | ICD-10-CM | POA: Diagnosis not present

## 2019-11-06 DIAGNOSIS — G4733 Obstructive sleep apnea (adult) (pediatric): Secondary | ICD-10-CM

## 2019-11-06 DIAGNOSIS — R4189 Other symptoms and signs involving cognitive functions and awareness: Secondary | ICD-10-CM | POA: Diagnosis not present

## 2019-11-06 DIAGNOSIS — F330 Major depressive disorder, recurrent, mild: Secondary | ICD-10-CM | POA: Diagnosis not present

## 2019-11-06 NOTE — Progress Notes (Signed)
   Neuropsychology Feedback Session Amber Fischer. [place] Department of Neurology  Reason for Referral:   Sude Amber Fischer a 59 y.o. right-handed Caucasian female referred by Metta Clines, D.O.,to characterize hercurrent cognitive functioning and assist with diagnostic clarity and treatment planning in the context of subjective cognitive decline and several psychiatric and medical comorbidities.   Feedback:   [name] completed a comprehensive neuropsychological evaluation on 10/30/2019. Please refer to that encounter for the full report and recommendations. Briefly, results suggested primary impairments surrounding encoding (i.e., learning) novel information across memory measures. However, delayed recall and retention of previously learned information was largely appropriate. Additional isolated weaknesses were exhibited across certain executive functioning tasks (namely abstract reasoning and problem solving) and one task assessing visual discrimination. The etiology of Amber Fischer's weaknesses is likely multifactorial in nature. She reported the presence of untreated sleep apnea and does not use her CPAP machine due to what appeared to be difficulties with parts being shipped to her home and the potential necessity for longer hoses. Brain hypoxia caused by untreated sleep apnea (as well as her history of COPD) can certainly create and maintain cognitive dysfunction, especially across domains of processing speed, attention/concentration, executive functioning, and learning aspects of memory. Additionally, she reported symptoms of chronic pain (and a history of fibromyalgia), ongoing headache symptoms, and mild psychiatric distress, all of which would further impact cognitive functions in a similar fashion. Medications, especially hydrocodone and Topamax/topiramate have known and well established cognitive side effects. As such, polypharmacy could be contributing to a mild  extent.  [name] was accompanied by her daughter on the current telephone call. Content of the current session focused on the results of her neuropsychological evaluation. Amber Fischer and her daughter were given the opportunity to ask questions and their questions were answered. They were encouraged to reach out should additional questions arise. A copy of her report was mailed at the conclusion of the visit.      23 minutes were spent conducting the current feedback session with Amber Fischer, billed as one unit [phone].

## 2019-11-09 ENCOUNTER — Other Ambulatory Visit (INDEPENDENT_AMBULATORY_CARE_PROVIDER_SITE_OTHER): Payer: Self-pay | Admitting: Gastroenterology

## 2019-11-19 ENCOUNTER — Other Ambulatory Visit: Payer: Self-pay | Admitting: Cardiovascular Disease

## 2019-11-20 ENCOUNTER — Ambulatory Visit: Payer: Medicaid Other | Admitting: Neurology

## 2019-11-21 ENCOUNTER — Other Ambulatory Visit: Payer: Self-pay | Admitting: Cardiovascular Disease

## 2019-11-21 ENCOUNTER — Telehealth: Payer: Self-pay | Admitting: *Deleted

## 2019-11-21 ENCOUNTER — Telehealth: Payer: Self-pay

## 2019-11-21 DIAGNOSIS — E785 Hyperlipidemia, unspecified: Secondary | ICD-10-CM

## 2019-11-21 NOTE — Telephone Encounter (Signed)
PA has been sent to pharm.

## 2019-11-21 NOTE — Telephone Encounter (Signed)
[facility] will not fill patient's REPATHA SURECLICK [id number] MG/ML SOAJ  Due to needing prior authorization. She has been out of her medicine since last Wednesday

## 2019-11-21 NOTE — Telephone Encounter (Signed)
Pt was called and lmomed informed that she will need to complete labs in order to do a reauthorization for the repatha through [facility]

## 2019-11-21 NOTE — Telephone Encounter (Signed)
Can you look into this?  Thanks,

## 2019-11-21 NOTE — Telephone Encounter (Signed)
Received fax from [facility] - need PA for [place] - Medicaid.

## 2019-11-21 NOTE — Telephone Encounter (Signed)
lmomed the pt that labs will be needed to complete the pa

## 2019-11-28 ENCOUNTER — Telehealth: Payer: Self-pay | Admitting: Cardiovascular Disease

## 2019-11-28 NOTE — Telephone Encounter (Signed)
Received call from [facility] in regards to patients authorization for [place] .

## 2019-11-28 NOTE — Telephone Encounter (Signed)
Labs noted,  Will send PA request for continuation of medication

## 2019-11-28 NOTE — Telephone Encounter (Signed)
Lipid labs are now available in epic.

## 2019-11-30 ENCOUNTER — Telehealth: Payer: Self-pay

## 2019-11-30 NOTE — Telephone Encounter (Signed)
Per Daughter Pharmacy has not refilled Repatha.   Please call daughter [phone]   Thanks  renee

## 2019-12-03 NOTE — Telephone Encounter (Signed)
Pt's daughter called stating pt is still out of her Repatha and hasn't heard anything from the pharmacy --  Do we have any samples we could give the pt?  Please call- Becky @ [phone]

## 2019-12-03 NOTE — Telephone Encounter (Signed)
Called and lmomed the pt stated that I would provide a sample and instructed them to come by asap

## 2019-12-07 NOTE — Telephone Encounter (Signed)
Patient's daughter, Kimyata, states samples are no longer needed. She states the pharmacy has refilled the medication.

## 2020-01-08 DIAGNOSIS — L900 Lichen sclerosus et atrophicus: Secondary | ICD-10-CM | POA: Insufficient documentation

## 2020-02-18 ENCOUNTER — Telehealth (INDEPENDENT_AMBULATORY_CARE_PROVIDER_SITE_OTHER): Payer: Self-pay | Admitting: *Deleted

## 2020-02-18 NOTE — Telephone Encounter (Signed)
A PA has been submitted to [facility] for the medication Dexilant. Patient will be made aware of outcome as we are.
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
## 2020-04-02 ENCOUNTER — Ambulatory Visit (INDEPENDENT_AMBULATORY_CARE_PROVIDER_SITE_OTHER): Payer: Medicaid Other | Admitting: Neurology

## 2020-04-02 ENCOUNTER — Encounter: Payer: Self-pay | Admitting: Neurology

## 2020-04-02 ENCOUNTER — Other Ambulatory Visit: Payer: Self-pay

## 2020-04-02 VITALS — BP 110/60 | HR 74 | Resp 20 | Ht 69.0 in | Wt 260.0 lb

## 2020-04-02 DIAGNOSIS — F330 Major depressive disorder, recurrent, mild: Secondary | ICD-10-CM

## 2020-04-02 DIAGNOSIS — R4189 Other symptoms and signs involving cognitive functions and awareness: Secondary | ICD-10-CM | POA: Diagnosis not present

## 2020-04-02 DIAGNOSIS — M797 Fibromyalgia: Secondary | ICD-10-CM

## 2020-04-02 DIAGNOSIS — G4733 Obstructive sleep apnea (adult) (pediatric): Secondary | ICD-10-CM

## 2020-04-02 DIAGNOSIS — F5104 Psychophysiologic insomnia: Secondary | ICD-10-CM

## 2020-04-02 NOTE — Patient Instructions (Signed)
So far there is no evidence of Alzheimer's disease or other neurodegenerative dementia.  At this time, your untreated sleep apnea, chronic pain and depression and anxiety are likely contributing to the memory problems.  It is important to get these conditions under control.  Once appropriately treated, we can repeat the memory testing if you have no improvement in memory.  1.  Recommend contacting your PCP to set up for a CPAP.  May need to see a sleep specialist again 2.  Recommend contacting your PCP to see a psychiatrist and see a therapist for psychotherapy 3.  Continue pain management. 4.  Follow a mediterranean diet (see below) 5.  Routine exercise.   Mediterranean Diet A Mediterranean diet refers to food and lifestyle choices that are based on the traditions of countries located on the [employer]. This way of eating has been shown to help prevent certain conditions and improve outcomes for people who have chronic diseases, like kidney disease and heart disease. What are tips for following this plan? Lifestyle  Cook and eat meals together with your family, when possible.  Drink enough fluid to keep your urine clear or pale yellow.  Be physically active every day. This includes: ? Aerobic exercise like running or swimming. ? Leisure activities like gardening, walking, or housework.  Get 7-8 hours of sleep each night.  If recommended by your health care provider, drink red wine in moderation. This means 1 glass a day for nonpregnant women and 2 glasses a day for men. A glass of wine equals 5 oz (150 mL). Reading food labels   Check the serving size of packaged foods. For foods such as rice and pasta, the serving size refers to the amount of cooked product, not dry.  Check the total fat in packaged foods. Avoid foods that have saturated fat or trans fats.  Check the ingredients list for added sugars, such as corn syrup. Shopping  At the grocery store, buy most of your  food from the areas near the walls of the store. This includes: ? Fresh fruits and vegetables (produce). ? Grains, beans, nuts, and seeds. Some of these may be available in unpackaged forms or large amounts (in bulk). ? Fresh seafood. ? Poultry and eggs. ? Low-fat dairy products.  Buy whole ingredients instead of prepackaged foods.  Buy fresh fruits and vegetables in-season from local farmers markets.  Buy frozen fruits and vegetables in resealable bags.  If you do not have access to quality fresh seafood, buy precooked frozen shrimp or canned fish, such as tuna, salmon, or sardines.  Buy small amounts of raw or cooked vegetables, salads, or olives from the deli or salad bar at your store.  Stock your pantry so you always have certain foods on hand, such as olive oil, canned tuna, canned tomatoes, rice, pasta, and beans. Cooking  Cook foods with extra-virgin olive oil instead of using butter or other vegetable oils.  Have meat as a side dish, and have vegetables or grains as your main dish. This means having meat in small portions or adding small amounts of meat to foods like pasta or stew.  Use beans or vegetables instead of meat in common dishes like chili or lasagna.  Experiment with different cooking methods. Try roasting or broiling vegetables instead of steaming or sauteing them.  Add frozen vegetables to soups, stews, pasta, or rice.  Add nuts or seeds for added healthy fat at each meal. You can add these to yogurt, salads, or vegetable  dishes.  Marinate fish or vegetables using olive oil, lemon juice, garlic, and fresh herbs. Meal planning   Plan to eat 1 vegetarian meal one day each week. Try to work up to 2 vegetarian meals, if possible.  Eat seafood 2 or more times a week.  Have healthy snacks readily available, such as: ? Vegetable sticks with hummus. ? [place] yogurt. ? Fruit and nut trail mix.  Eat balanced meals throughout the week. This includes: ? Fruit:  2-3 servings a day ? Vegetables: 4-5 servings a day ? Low-fat dairy: 2 servings a day ? Fish, poultry, or lean meat: 1 serving a day ? Beans and legumes: 2 or more servings a week ? Nuts and seeds: 1-2 servings a day ? Whole grains: 6-8 servings a day ? Extra-virgin olive oil: 3-4 servings a day  Limit red meat and sweets to only a few servings a month What are my food choices?  Mediterranean diet ? Recommended  Grains: Whole-grain pasta. Brown rice. Bulgar wheat. Polenta. Couscous. Whole-wheat bread. [place].  Vegetables: Artichokes. Beets. Broccoli. Cabbage. Carrots. Eggplant. Green beans. Chard. Kale. Spinach. Onions. Leeks. Peas. Squash. Tomatoes. Peppers. Radishes.  Fruits: Apples. Apricots. Avocado. Berries. Bananas. Cherries. Dates. Figs. Grapes. Lemons. Melon. Oranges. Peaches. Plums. Pomegranate.  Meats and other protein foods: Beans. Almonds. Sunflower seeds. Pine nuts. Peanuts. [place]. Salmon. Scallops. Shrimp. [place]. Tilapia. Clams. Oysters. Eggs.  Dairy: Low-fat milk. Cheese. Greek yogurt.  Beverages: Water. Red wine. Herbal tea.  Fats and oils: Extra virgin olive oil. Avocado oil. Grape seed oil.  Sweets and desserts: [place] yogurt with honey. Baked apples. Poached pears. Trail mix.  Seasoning and other foods: Basil. Cilantro. Coriander. Cumin. Mint. Parsley. Sage. Rosemary. Tarragon. Garlic. Oregano. Thyme. Pepper. Balsalmic vinegar. Tahini. Hummus. Tomato sauce. Olives. Mushrooms. ? Limit these  Grains: Prepackaged pasta or rice dishes. Prepackaged cereal with added sugar.  Vegetables: Deep fried potatoes (french fries).  Fruits: Fruit canned in syrup.  Meats and other protein foods: Beef. Pork. Lamb. Poultry with skin. Hot dogs. Berniece Salines.  Dairy: Ice cream. Sour cream. Whole milk.  Beverages: Juice. Sugar-sweetened soft drinks. Beer. Liquor and spirits.  Fats and oils: Butter. Canola oil. Vegetable oil. Beef fat (tallow). Lard.  Sweets and desserts:  Cookies. Cakes. Pies. Candy.  Seasoning and other foods: Mayonnaise. Premade sauces and marinades. The items listed may not be a complete list. Talk with your dietitian about what dietary choices are right for you. Summary  The Mediterranean diet includes both food and lifestyle choices.  Eat a variety of fresh fruits and vegetables, beans, nuts, seeds, and whole grains.  Limit the amount of red meat and sweets that you eat.  Talk with your health care provider about whether it is safe for you to drink red wine in moderation. This means 1 glass a day for nonpregnant women and 2 glasses a day for men. A glass of wine equals 5 oz (150 mL). This information is not intended to replace advice given to you by your health care provider. Make sure you discuss any questions you have with your health care provider. Document Revised: 02/05/2016 Document Reviewed: 01/29/2016 Elsevier Patient Education  [place].

## 2020-04-03 ENCOUNTER — Other Ambulatory Visit: Payer: Self-pay | Admitting: Cardiology

## 2020-04-03 ENCOUNTER — Telehealth: Payer: Self-pay | Admitting: Family Medicine

## 2020-04-03 ENCOUNTER — Other Ambulatory Visit: Payer: Self-pay | Admitting: Family Medicine

## 2020-04-03 MED ORDER — ISOSORBIDE MONONITRATE ER 60 MG PO TB24: 90.0000 mg | ORAL_TABLET | Freq: Every day | ORAL | 6 refills | Status: DC

## 2020-04-03 NOTE — Telephone Encounter (Signed)
I verified  with patient that she takes Imdur 60 mg, 1 1/2 tablets (90 mg total) daily

## 2020-04-03 NOTE — Telephone Encounter (Signed)
° ° °  1. Which medications need to be refilled? (please list name of each medication and dose if known) Isosorbide 60 mg   2. Which pharmacy/location (including street and city if local pharmacy) is medication to be sent to?  [facility]   3. Do they need a 30 day or 90 day supply?

## 2020-04-09 NOTE — Telephone Encounter (Signed)
Error
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
## 2020-04-24 ENCOUNTER — Other Ambulatory Visit: Payer: Medicaid Other

## 2020-04-24 ENCOUNTER — Other Ambulatory Visit: Payer: Self-pay

## 2020-04-24 ENCOUNTER — Telehealth: Payer: Self-pay | Admitting: Family Medicine

## 2020-04-24 ENCOUNTER — Encounter: Payer: Self-pay | Admitting: Family Medicine

## 2020-04-24 ENCOUNTER — Ambulatory Visit (INDEPENDENT_AMBULATORY_CARE_PROVIDER_SITE_OTHER): Payer: Medicaid Other | Admitting: Family Medicine

## 2020-04-24 VITALS — BP 104/70 | HR 60 | Ht 69.0 in | Wt 246.2 lb

## 2020-04-24 DIAGNOSIS — R0789 Other chest pain: Secondary | ICD-10-CM | POA: Diagnosis not present

## 2020-04-24 DIAGNOSIS — E782 Mixed hyperlipidemia: Secondary | ICD-10-CM | POA: Diagnosis not present

## 2020-04-24 DIAGNOSIS — K3184 Gastroparesis: Secondary | ICD-10-CM

## 2020-04-24 DIAGNOSIS — I10 Essential (primary) hypertension: Secondary | ICD-10-CM

## 2020-04-24 DIAGNOSIS — I2510 Atherosclerotic heart disease of native coronary artery without angina pectoris: Secondary | ICD-10-CM

## 2020-04-24 DIAGNOSIS — R0602 Shortness of breath: Secondary | ICD-10-CM

## 2020-04-24 DIAGNOSIS — R109 Unspecified abdominal pain: Secondary | ICD-10-CM

## 2020-04-24 DIAGNOSIS — Z23 Encounter for immunization: Secondary | ICD-10-CM | POA: Diagnosis not present

## 2020-04-24 MED ORDER — REPATHA SURECLICK 140 MG/ML ~~LOC~~ SOAJ: SUBCUTANEOUS | 12 refills | Status: DC

## 2020-04-24 NOTE — Telephone Encounter (Signed)
Pre-cert Verification for the following procedure    ECHO    DATE:   04/24/2020  LOCATION   [facility]  [facility]

## 2020-04-24 NOTE — Patient Instructions (Addendum)
Medication Instructions:   Your physician recommends that you continue on your current medications as directed. Please refer to the Current Medication list given to you today.  Labwork:  None  Testing/Procedures: Your physician has requested that you have an echocardiogram. Echocardiography is a painless test that uses sound waves to create images of your heart. It provides your doctor with information about the size and shape of your heart and how well your heart's chambers and valves are working. This procedure takes approximately one hour. There are no restrictions for this procedure.  Follow-Up:  Your physician recommends that you schedule a follow-up appointment in: 6-8 weeks.  You have been referred to [facility] in [place]  Any Other Special Instructions Will Be Listed Below (If Applicable).  If you need a refill on your cardiac medications before your next appointment, please call your pharmacy.

## 2020-05-05 ENCOUNTER — Other Ambulatory Visit (INDEPENDENT_AMBULATORY_CARE_PROVIDER_SITE_OTHER): Payer: Self-pay | Admitting: Gastroenterology

## 2020-05-28 ENCOUNTER — Other Ambulatory Visit: Payer: Medicaid Other

## 2020-06-05 ENCOUNTER — Ambulatory Visit: Payer: Medicaid Other | Admitting: Family Medicine

## 2020-11-04 ENCOUNTER — Telehealth: Payer: Self-pay

## 2020-11-04 DIAGNOSIS — E785 Hyperlipidemia, unspecified: Secondary | ICD-10-CM

## 2020-11-04 NOTE — Telephone Encounter (Signed)
LMOM FOR PT TO COMPLETE FASTING LABS

## 2020-12-08 ENCOUNTER — Telehealth: Payer: Self-pay

## 2020-12-08 NOTE — Telephone Encounter (Signed)
Lmom to complete fasting labs

## 2020-12-29 ENCOUNTER — Telehealth: Payer: Self-pay | Admitting: Family Medicine

## 2020-12-29 MED ORDER — REPATHA SURECLICK 140 MG/ML ~~LOC~~ SOAJ: SUBCUTANEOUS | 12 refills | Status: DC

## 2020-12-29 NOTE — Telephone Encounter (Signed)
Called and spoke w/daughter and spoke with her about needing labs and voiced understanding

## 2020-12-29 NOTE — Telephone Encounter (Signed)
New message     Needs authorization obtained for the Repatha and needs new prescription sent to     *STAT* If patient is at the pharmacy, call can be transferred to refill team.   1. Which medications need to be refilled? (please list name of each medication and dose if known) repatha   2. Which pharmacy/location (including street and city if local pharmacy) is medication to be sent to? [facility] in [place]  3. Do they need a 30 day or 90 day supply? [place]

## 2020-12-29 NOTE — Addendum Note (Signed)
Addended by: Allean Found on: 12/29/2020 05:12 PM   Modules accepted: Orders

## 2020-12-29 NOTE — Telephone Encounter (Signed)
The pt's daughter statedthat all is needed is a pa so once the lab work is done we can get working on it and refll sent to [facility] as requested

## 2021-01-05 ENCOUNTER — Other Ambulatory Visit: Payer: Self-pay

## 2021-01-05 DIAGNOSIS — E785 Hyperlipidemia, unspecified: Secondary | ICD-10-CM

## 2021-01-09 ENCOUNTER — Other Ambulatory Visit: Payer: Self-pay | Admitting: Family Medicine

## 2021-01-09 NOTE — Telephone Encounter (Signed)
New message     Patient has been out of her Repatha for 2 weeks - she needs prior authorization    *STAT* If patient is at the pharmacy, call can be transferred to refill team.   1. Which medications need to be refilled? (please list name of each medication and dose if known) repatha  2. Which pharmacy/location (including street and city if local pharmacy) is medication to be sent to? [facility] in [place]  3. Do they need a 30 day or 90 day supply? [place]

## 2021-01-12 ENCOUNTER — Telehealth: Payer: Self-pay | Admitting: *Deleted

## 2021-01-12 NOTE — Telephone Encounter (Signed)
Pa is finally able to be completed as the pt finally went to get labs. Amber Fischer (KeyAW:[id number]) pa completed and awaiting determination.

## 2021-01-12 NOTE — Telephone Encounter (Signed)
Received fax from [place] has been approved - start date is 12/29/2020.  End date - until further notice.  Approved quantity - 54m's per 28 day.

## 2021-04-30 ENCOUNTER — Other Ambulatory Visit: Payer: Self-pay | Admitting: Family Medicine

## 2021-06-05 ENCOUNTER — Telehealth: Payer: Self-pay | Admitting: Cardiology

## 2021-06-05 MED ORDER — REPATHA SURECLICK 140 MG/ML ~~LOC~~ SOAJ: SUBCUTANEOUS | 3 refills | Status: DC

## 2021-06-05 NOTE — Telephone Encounter (Signed)
°*  STAT* If patient is at the pharmacy, call can be transferred to refill team.   1. Which medications need to be refilled? (please list name of each medication and dose if known) REPATHA SURECLICK 301 MG/ML SOAJ  2. Which pharmacy/location (including street and city if local pharmacy) is medication to be sent to? [facility] [place], [place]  3. Do they need a 30 day or 90 day supply? [place]

## 2021-06-09 ENCOUNTER — Ambulatory Visit: Payer: Medicaid Other | Admitting: Cardiology
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
## 2021-06-18 ENCOUNTER — Ambulatory Visit (INDEPENDENT_AMBULATORY_CARE_PROVIDER_SITE_OTHER): Payer: Medicaid Other | Admitting: Cardiovascular Disease

## 2021-06-18 ENCOUNTER — Other Ambulatory Visit: Payer: Self-pay

## 2021-06-18 ENCOUNTER — Encounter: Payer: Self-pay | Admitting: Cardiovascular Disease

## 2021-06-18 VITALS — BP 140/88 | Resp 20 | Ht 69.0 in | Wt 276.0 lb

## 2021-06-18 DIAGNOSIS — I10 Essential (primary) hypertension: Secondary | ICD-10-CM | POA: Diagnosis not present

## 2021-06-18 DIAGNOSIS — I2510 Atherosclerotic heart disease of native coronary artery without angina pectoris: Secondary | ICD-10-CM | POA: Diagnosis not present

## 2021-06-18 DIAGNOSIS — E782 Mixed hyperlipidemia: Secondary | ICD-10-CM

## 2021-06-18 NOTE — Patient Instructions (Signed)

## 2021-06-19 ENCOUNTER — Telehealth: Payer: Self-pay | Admitting: Cardiovascular Disease

## 2021-06-19 MED ORDER — NITROGLYCERIN 0.4 MG SL SUBL: 0.4000 mg | SUBLINGUAL_TABLET | SUBLINGUAL | 3 refills | Status: DC | PRN

## 2021-06-19 NOTE — Telephone Encounter (Signed)
Refilled NTG as requested 

## 2021-06-19 NOTE — Telephone Encounter (Signed)
°*  STAT* If patient is at the pharmacy, call can be transferred to refill team.   1. Which medications need to be refilled? (please list name of each medication and dose if known)  nitroGLYCERIN (NITROSTAT) 0.4 MG SL tablet  2. Which pharmacy/location (including street and city if local pharmacy) is medication to be sent to? [facility] - [place], [place]  3. Do they need a 30 day or 90 day supply? 30 with refills

## 2021-08-26 ENCOUNTER — Other Ambulatory Visit: Payer: Self-pay | Admitting: Cardiology

## 2021-12-23 ENCOUNTER — Other Ambulatory Visit: Payer: Self-pay | Admitting: Cardiovascular Disease
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
## 2022-06-18 ENCOUNTER — Other Ambulatory Visit
Admission: RE | Admit: 2022-06-18 | Discharge: 2022-06-18 | Disposition: A | Discharge status: Home / Self Care | Payer: Medicaid Other | Source: Ambulatory Visit | Attending: Cardiovascular Disease | Admitting: Cardiovascular Disease

## 2022-06-18 ENCOUNTER — Ambulatory Visit (INDEPENDENT_AMBULATORY_CARE_PROVIDER_SITE_OTHER): Payer: Medicaid Other | Admitting: Cardiovascular Disease

## 2022-06-18 ENCOUNTER — Encounter: Payer: Self-pay | Admitting: Cardiovascular Disease

## 2022-06-18 ENCOUNTER — Other Ambulatory Visit: Payer: Self-pay | Admitting: Cardiovascular Disease

## 2022-06-18 VITALS — HR 67 | Ht 69.0 in | Wt 255.0 lb

## 2022-06-18 DIAGNOSIS — Z01818 Encounter for other preprocedural examination: Secondary | ICD-10-CM | POA: Diagnosis present

## 2022-06-18 DIAGNOSIS — I209 Angina pectoris, unspecified: Secondary | ICD-10-CM

## 2022-06-18 DIAGNOSIS — I2510 Atherosclerotic heart disease of native coronary artery without angina pectoris: Secondary | ICD-10-CM

## 2022-06-18 DIAGNOSIS — E782 Mixed hyperlipidemia: Secondary | ICD-10-CM

## 2022-06-18 LAB — CBC
HCT: 45.3 % (ref 36.0–46.0)
Hemoglobin: 14.6 g/dL (ref 12.0–15.0)
MCH: 30.5 pg (ref 26.0–34.0)
MCHC: 32.2 g/dL (ref 30.0–36.0)
MCV: 94.6 fL (ref 80.0–100.0)
Platelets: 185 10*3/uL (ref 150–400)
RBC: 4.79 MIL/uL (ref 3.87–5.11)
RDW: 12.9 % (ref 11.5–15.5)
WBC: 7.7 10*3/uL (ref 4.0–10.5)
nRBC: 0 % (ref 0.0–0.2)

## 2022-06-18 LAB — BASIC METABOLIC PANEL
Anion gap: 6 (ref 5–15)
BUN: 19 mg/dL (ref 6–20)
CO2: 23 mmol/L (ref 22–32)
Calcium: 9.7 mg/dL (ref 8.9–10.3)
Chloride: 111 mmol/L (ref 98–111)
Creatinine, Ser: 0.99 mg/dL (ref 0.44–1.00)
GFR, Estimated: >60 mL/min (ref >60)
Glucose, Bld: 125 mg/dL — ABNORMAL HIGH (ref 70–99)
Potassium: 4.6 mmol/L (ref 3.5–5.1)
Sodium: 140 mmol/L (ref 135–145)

## 2022-06-18 MED ORDER — SODIUM CHLORIDE 0.9% FLUSH: 3.0000 mL | Freq: Two times a day (BID) | INTRAVENOUS | Status: AC

## 2022-06-18 MED ORDER — METOPROLOL SUCCINATE ER 50 MG PO TB24: 50.0000 mg | ORAL_TABLET | Freq: Every day | ORAL | 3 refills | Status: DC

## 2022-06-18 NOTE — Patient Instructions (Signed)
Medication Instructions:  Your physician has recommended you make the following change in your medication:  Stop Lopressor Start Toprol XL 50 mg tablets once daily  Labwork: BMET CBC  Testing/Procedures: Left Heart Cath  Any Other Special Instructions Will Be Listed Below (If Applicable).   If you need a refill on your cardiac medications before your next appointment, please call your pharmacy.    [place] A DEPT OF [place] A DEPT OF [place]. [facility] [place] [id number] [address] Dept: [phone] Loc: [place]  06/18/2022  You are scheduled for a Cardiac Catheterization on Thursday, January 4 with Dr. Daneen Schick.  1. Please arrive at the [facility] (Main Entrance A) at [facility]: [address] [place], [place] [address] at 5:30 AM (This time is two hours before your procedure to ensure your preparation). Free valet parking service is available.   Special note: Every effort is made to have your procedure done on time. Please understand that emergencies sometimes delay scheduled procedures.  2. Diet: Do not eat solid foods after midnight.  The patient may have clear liquids until 5am upon the day of the procedure.  3. Labs: You will need to have blood drawn on Today  4. Medication instructions in preparation for your procedure:   Contrast Allergy: No  ONLY TAKE HALF A DOSE OF INSULIN THE NIGHT BEFORE YOUR CATH. NONE THE MORNING OF.   Do not take Diabetes Med Glucophage (Metformin) on the day of the procedure and HOLD 48 HOURS AFTER THE PROCEDURE.  On the morning of your procedure, take your Aspirin 81 mg and any morning medicines NOT listed above.  You may use sips of water.  5. Plan for one night stay--bring personal belongings. 6. Bring a current list of your medications and current insurance cards. 7. You MUST have a responsible person  to drive you home. 8. Someone MUST be with you the first 24 hours after you arrive home or your discharge will be delayed. 9. Please wear clothes that are easy to get on and off and wear slip-on shoes.  Thank you for allowing [place] to care for you!   -- [facility]

## 2022-06-23 NOTE — H&P (Signed)
Remote stent  demonstrated patent 2020. Recent recurring CP. Labs are okay.

## 2022-06-24 ENCOUNTER — Ambulatory Visit (HOSPITAL_COMMUNITY)
Admission: RE | Disposition: A | Discharge status: Home / Self Care | Payer: Self-pay | Source: Home / Self Care | Attending: Interventional Cardiology

## 2022-06-24 ENCOUNTER — Other Ambulatory Visit: Payer: Self-pay

## 2022-06-24 ENCOUNTER — Ambulatory Visit (HOSPITAL_COMMUNITY)
Admission: RE | Admit: 2022-06-24 | Discharge: 2022-06-24 | Disposition: A | Discharge status: Home / Self Care | Payer: Medicaid Other | Attending: Interventional Cardiology | Admitting: Interventional Cardiology

## 2022-06-24 ENCOUNTER — Encounter (HOSPITAL_COMMUNITY): Payer: Self-pay | Admitting: Interventional Cardiology

## 2022-06-24 DIAGNOSIS — I25119 Atherosclerotic heart disease of native coronary artery with unspecified angina pectoris: Secondary | ICD-10-CM

## 2022-06-24 DIAGNOSIS — Z794 Long term (current) use of insulin: Secondary | ICD-10-CM

## 2022-06-24 DIAGNOSIS — E782 Mixed hyperlipidemia: Secondary | ICD-10-CM | POA: Diagnosis not present

## 2022-06-24 DIAGNOSIS — I2510 Atherosclerotic heart disease of native coronary artery without angina pectoris: Secondary | ICD-10-CM | POA: Insufficient documentation

## 2022-06-24 DIAGNOSIS — G4733 Obstructive sleep apnea (adult) (pediatric): Secondary | ICD-10-CM | POA: Diagnosis present

## 2022-06-24 DIAGNOSIS — E785 Hyperlipidemia, unspecified: Secondary | ICD-10-CM | POA: Diagnosis present

## 2022-06-24 DIAGNOSIS — K7460 Unspecified cirrhosis of liver: Secondary | ICD-10-CM | POA: Insufficient documentation

## 2022-06-24 DIAGNOSIS — R079 Chest pain, unspecified: Secondary | ICD-10-CM | POA: Diagnosis present

## 2022-06-24 DIAGNOSIS — E119 Type 2 diabetes mellitus without complications: Secondary | ICD-10-CM | POA: Diagnosis not present

## 2022-06-24 DIAGNOSIS — Z87891 Personal history of nicotine dependence: Secondary | ICD-10-CM | POA: Insufficient documentation

## 2022-06-24 DIAGNOSIS — I209 Angina pectoris, unspecified: Secondary | ICD-10-CM

## 2022-06-24 DIAGNOSIS — J449 Chronic obstructive pulmonary disease, unspecified: Secondary | ICD-10-CM | POA: Insufficient documentation

## 2022-06-24 DIAGNOSIS — Z79899 Other long term (current) drug therapy: Secondary | ICD-10-CM | POA: Diagnosis not present

## 2022-06-24 DIAGNOSIS — I10 Essential (primary) hypertension: Secondary | ICD-10-CM | POA: Diagnosis present

## 2022-06-24 DIAGNOSIS — G8929 Other chronic pain: Secondary | ICD-10-CM | POA: Diagnosis not present

## 2022-06-24 DIAGNOSIS — R0609 Other forms of dyspnea: Secondary | ICD-10-CM | POA: Insufficient documentation

## 2022-06-24 DIAGNOSIS — Z955 Presence of coronary angioplasty implant and graft: Secondary | ICD-10-CM | POA: Insufficient documentation

## 2022-06-24 DIAGNOSIS — K219 Gastro-esophageal reflux disease without esophagitis: Secondary | ICD-10-CM | POA: Insufficient documentation

## 2022-06-24 DIAGNOSIS — E118 Type 2 diabetes mellitus with unspecified complications: Secondary | ICD-10-CM

## 2022-06-24 HISTORY — PX: LEFT HEART CATH AND CORONARY ANGIOGRAPHY: CATH118249

## 2022-06-24 LAB — GLUCOSE, CAPILLARY
Glucose-Capillary: 124 mg/dL — ABNORMAL HIGH (ref 70–99)
Glucose-Capillary: 107 mg/dL — ABNORMAL HIGH (ref 70–99)
Glucose-Capillary: 100 mg/dL — ABNORMAL HIGH (ref 70–99)

## 2022-06-24 SURGERY — LEFT HEART CATH AND CORONARY ANGIOGRAPHY
Anesthesia: LOCAL

## 2022-06-24 MED ORDER — HEPARIN (PORCINE) IN NACL 1000-0.9 UT/500ML-% IV SOLN
INTRAVENOUS | Status: AC
  Filled 2022-06-24: qty 500

## 2022-06-24 MED ORDER — HEPARIN SODIUM (PORCINE) 1000 UNIT/ML IJ SOLN
INTRAMUSCULAR | Status: AC
  Filled 2022-06-24: qty 10

## 2022-06-24 MED ORDER — FENTANYL CITRATE (PF) 100 MCG/2ML IJ SOLN
INTRAMUSCULAR | Status: AC
  Filled 2022-06-24: qty 2

## 2022-06-24 MED ORDER — ACETAMINOPHEN 325 MG PO TABS: 650.0000 mg | ORAL_TABLET | ORAL | Status: DC | PRN

## 2022-06-24 MED ORDER — MIDAZOLAM HCL 2 MG/2ML IJ SOLN
INTRAMUSCULAR | Status: AC
  Filled 2022-06-24: qty 2

## 2022-06-24 MED ORDER — SODIUM CHLORIDE 0.9 % WEIGHT BASED INFUSION: 1.0000 mL/kg/h | INTRAVENOUS | Status: DC

## 2022-06-24 MED ORDER — SODIUM CHLORIDE 0.9 % IV SOLN: 250.0000 mL | INTRAVENOUS | Status: DC | PRN

## 2022-06-24 MED ORDER — SODIUM CHLORIDE 0.9 % IV SOLN: INTRAVENOUS | Status: AC

## 2022-06-24 MED ORDER — SODIUM CHLORIDE 0.9 % WEIGHT BASED INFUSION
3.0000 mL/kg/h | INTRAVENOUS | Status: AC
  Administered 2022-06-24: 3 mL/kg/h via INTRAVENOUS

## 2022-06-24 MED ORDER — MIDAZOLAM HCL 2 MG/2ML IJ SOLN
INTRAMUSCULAR | Status: DC | PRN
  Administered 2022-06-24 (×2): 1 mg via INTRAVENOUS

## 2022-06-24 MED ORDER — NITROGLYCERIN 1 MG/10 ML FOR IR/CATH LAB
INTRA_ARTERIAL | Status: AC
  Filled 2022-06-24: qty 10

## 2022-06-24 MED ORDER — HEPARIN (PORCINE) IN NACL 1000-0.9 UT/500ML-% IV SOLN
INTRAVENOUS | Status: DC | PRN
  Administered 2022-06-24 (×2): 500 mL

## 2022-06-24 MED ORDER — LIDOCAINE HCL (PF) 1 % IJ SOLN
INTRAMUSCULAR | Status: AC
  Filled 2022-06-24: qty 30

## 2022-06-24 MED ORDER — SODIUM CHLORIDE 0.9% FLUSH: 3.0000 mL | INTRAVENOUS | Status: DC | PRN

## 2022-06-24 MED ORDER — SODIUM CHLORIDE 0.9 % IV SOLN
250.0000 mL | INTRAVENOUS | Status: DC | PRN
  Administered 2022-06-24: 300 mL via INTRAVENOUS

## 2022-06-24 MED ORDER — ONDANSETRON HCL 4 MG/2ML IJ SOLN
4.0000 mg | Freq: Four times a day (QID) | INTRAMUSCULAR | Status: DC | PRN
  Administered 2022-06-24: 4 mg via INTRAVENOUS

## 2022-06-24 MED ORDER — IOHEXOL 350 MG/ML SOLN
INTRAVENOUS | Status: DC | PRN
  Administered 2022-06-24: 80 mL via INTRA_ARTERIAL

## 2022-06-24 MED ORDER — FENTANYL CITRATE (PF) 100 MCG/2ML IJ SOLN
INTRAMUSCULAR | Status: DC | PRN
  Administered 2022-06-24 (×2): 25 ug via INTRAVENOUS

## 2022-06-24 MED ORDER — ONDANSETRON HCL 4 MG/2ML IJ SOLN
INTRAMUSCULAR | Status: AC
  Filled 2022-06-24: qty 2

## 2022-06-24 MED ORDER — NITROGLYCERIN 1 MG/10 ML FOR IR/CATH LAB
INTRA_ARTERIAL | Status: DC | PRN
  Administered 2022-06-24: 100 ug via INTRACORONARY

## 2022-06-24 MED ORDER — VERAPAMIL HCL 2.5 MG/ML IV SOLN
INTRAVENOUS | Status: AC
  Filled 2022-06-24: qty 2

## 2022-06-24 MED ORDER — ASPIRIN 81 MG PO CHEW
81.0000 mg | CHEWABLE_TABLET | ORAL | Status: AC
  Administered 2022-06-24: 81 mg via ORAL
  Filled 2022-06-24: qty 1

## 2022-06-24 MED ORDER — HYDRALAZINE HCL 20 MG/ML IJ SOLN: 10.0000 mg | INTRAMUSCULAR | Status: DC | PRN

## 2022-06-24 MED ORDER — LABETALOL HCL 5 MG/ML IV SOLN: 10.0000 mg | INTRAVENOUS | Status: DC | PRN

## 2022-06-24 MED ORDER — LIDOCAINE HCL (PF) 1 % IJ SOLN
INTRAMUSCULAR | Status: DC | PRN
  Administered 2022-06-24: 10 mL
  Administered 2022-06-24: 2 mL

## 2022-06-24 MED ORDER — ASPIRIN 81 MG PO CHEW: 81.0000 mg | CHEWABLE_TABLET | ORAL | Status: DC

## 2022-06-24 MED ORDER — SODIUM CHLORIDE 0.9% FLUSH: 3.0000 mL | Freq: Two times a day (BID) | INTRAVENOUS | Status: DC

## 2022-06-24 SURGICAL SUPPLY — 14 items
BAG SNAP BAND KOVER 36X36 (MISCELLANEOUS) IMPLANT
CATH INFINITI 5FR MULTPACK ANG (CATHETERS) IMPLANT
GLIDESHEATH SLEND A-KIT 6F 22G (SHEATH) IMPLANT
GLIDESHEATH SLEND SS 6F .021 (SHEATH) IMPLANT
GUIDEWIRE INQWIRE 1.5J.035X260 (WIRE) IMPLANT
INQWIRE 1.5J .035X260CM (WIRE)
KIT HEART LEFT (KITS) ×1 IMPLANT
KIT MICROPUNCTURE NIT STIFF (SHEATH) IMPLANT
PACK CARDIAC CATHETERIZATION (CUSTOM PROCEDURE TRAY) ×1 IMPLANT
SHEATH PINNACLE 5F 10CM (SHEATH) IMPLANT
SHEATH PROBE COVER 6X72 (BAG) IMPLANT
TRANSDUCER W/STOPCOCK (MISCELLANEOUS) ×1 IMPLANT
TUBING CIL FLEX 10 FLL-RA (TUBING) ×1 IMPLANT
WIRE EMERALD 3MM-J .035X150CM (WIRE) IMPLANT

## 2022-06-24 NOTE — CV Procedure (Addendum)
Generalized coronary vasoconstriction noted in the left coronary artery with significant improvement after 100 mcg of intracoronary nitroglycerin. No significant obstructive disease.  There is less than 50% obstruction in the mid circumflex.  Distal circumflex also contains relatively focal 50 to 60% narrowing.  Less than 40% mid LAD overlapping the first diagonal. Normal LV function with low normal LVEDP.  EF 55%. Reviewed images from 2010, 2020, and compared to today's images demonstrating no appreciable change. Did not not perform a microvascular coronary disease study because patient states that she has asthma and wheezes.  That will prevent [place] from using adenosine. Suspect much of her chest pain is related to microvascular coronary disease +/- epicardial and or microvascular spasm. Recommend weaning and doing beta-blocker therapy.  Add negative inotropic calcium channel blocker therapy has protection against spasm.  Continue ARB.  Continue high intensity therapy for lipid-lowering.  Continue weight loss and consider cardiac rehabilitation.

## 2022-06-24 NOTE — Progress Notes (Signed)
SITE AREA: right groin/femoral   SITE PRIOR TO REMOVAL:  LEVEL 0  PRESSURE APPLIED FOR: approximately 20 minutes  MANUAL: yes  PATIENT STATUS DURING PULL: stable  POST PULL SITE:  LEVEL 0  POST PULL INSTRUCTIONS GIVEN: yes, drsg to remain in place for about 24 hours, no soaking in water, pools, bath tubs etc for few days, no running, jumping or heavy lifting, keep groin dry, verbal acknowledgement noted  POST PULL PULSES PRESENT: bilateral post tibial pulses palpable at +1  DRESSING APPLIED: gauze with tegaderm  BEDREST BEGINS @ 0954  COMMENTS: pt with c/o nausea prior to sheath removal, medicated, see MAR

## 2022-06-24 NOTE — Interval H&P Note (Signed)
Cath Lab Visit (complete for each Cath Lab visit)  Clinical Evaluation Leading to the Procedure:   ACS: No.  Non-ACS:    Anginal Classification: CCS III  Anti-ischemic medical therapy: Minimal Therapy (1 class of medications)  Non-Invasive Test Results: No non-invasive testing performed  Prior CABG: No previous CABG      History and Physical Interval Note:  06/24/2022 7:28 AM  Amber Fischer  has presented today for surgery, with the diagnosis of CHEST PAIN.  The various methods of treatment have been discussed with the patient and family. After consideration of risks, benefits and other options for treatment, the patient has consented to  Procedure(s): LEFT HEART CATH AND CORONARY ANGIOGRAPHY (N/A) as a surgical intervention.  The patient's history has been reviewed, patient examined, no change in status, stable for surgery.  I have reviewed the patient's chart and labs.  Questions were answered to the patient's satisfaction.     [name]

## 2022-06-25 MED FILL — Verapamil HCl IV Soln 2.5 MG/ML: INTRAVENOUS | Qty: 2 | Status: AC

## 2022-07-09 ENCOUNTER — Telehealth: Payer: Self-pay | Admitting: Cardiovascular Disease

## 2022-07-09 NOTE — Telephone Encounter (Signed)
Pt c/o medication issue:  1. Name of Medication:   metoprolol succinate (TOPROL-XL) 50 MG 24 hr tablet   2. How are you currently taking this medication (dosage and times per day)? Take 1 tablet (50 mg total) by mouth daily. Take with or immediately following a meal.   3. Are you having a reaction (difficulty breathing--STAT)?  No   4. What is your medication issue? Pt's daughter states that directions and dosage of medication is not what provider suggested. Daughter says that it is supposed to be 100 MG once daily due to pt forgetting to take 2nd dosage. She would like this change made if possible and called into pharmacy on file as well as a call to her once done. Please advise

## 2022-07-12 ENCOUNTER — Other Ambulatory Visit: Payer: Self-pay

## 2022-07-12 NOTE — Telephone Encounter (Signed)
Spoke with daughter ( ok per DPR). Informed daughter that MD's note states to switch to Toprol XL 50 mg daily.

## 2022-07-15 ENCOUNTER — Other Ambulatory Visit (HOSPITAL_COMMUNITY): Payer: Self-pay
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
## 2022-11-01 ENCOUNTER — Encounter: Payer: Self-pay | Admitting: Cardiovascular Disease

## 2022-11-01 ENCOUNTER — Ambulatory Visit: Payer: Medicaid Other | Attending: Cardiovascular Disease | Admitting: Cardiovascular Disease

## 2022-11-01 VITALS — BP 124/82 | HR 66 | Ht 69.5 in | Wt 248.8 lb

## 2022-11-01 DIAGNOSIS — I10 Essential (primary) hypertension: Secondary | ICD-10-CM

## 2022-11-01 DIAGNOSIS — I2510 Atherosclerotic heart disease of native coronary artery without angina pectoris: Secondary | ICD-10-CM | POA: Diagnosis not present

## 2022-11-01 DIAGNOSIS — E782 Mixed hyperlipidemia: Secondary | ICD-10-CM | POA: Diagnosis not present

## 2022-11-01 DIAGNOSIS — R0609 Other forms of dyspnea: Secondary | ICD-10-CM | POA: Diagnosis not present

## 2022-11-01 MED ORDER — DILTIAZEM HCL ER COATED BEADS 120 MG PO CP24: 120.0000 mg | ORAL_CAPSULE | Freq: Every day | ORAL | 3 refills | Status: DC

## 2022-11-01 NOTE — Patient Instructions (Signed)
Medication Instructions:   Stop Taking Toprol XL  Start Taking Cardizem CD 120 mg Daily   *If you need a refill on your cardiac medications before your next appointment, please call your pharmacy*   Lab Work: NONE   If you have labs (blood work) drawn today and your tests are completely normal, you will receive your results only by: MyChart Message (if you have MyChart) OR A paper copy in the mail If you have any lab test that is abnormal or we need to change your treatment, we will call you to review the results.   Testing/Procedures: Your physician has recommended that you have a pulmonary function test. Pulmonary Function Tests are a group of tests that measure how well air moves in and out of your lungs.   Follow-Up: At [facility], you and your health needs are our priority.  As part of our continuing mission to provide you with exceptional heart care, we have created designated Provider Care Teams.  These Care Teams include your primary Cardiologist (physician) and Advanced Practice Providers (APPs -  Physician Assistants and Nurse Practitioners) who all work together to provide you with the care you need, when you need it.  We recommend signing up for the patient portal called "MyChart".  Sign up information is provided on this After Visit Summary.  MyChart is used to connect with patients for Virtual Visits (Telemedicine).  Patients are able to view lab/test results, encounter notes, upcoming appointments, etc.  Non-urgent messages can be sent to your provider as well.   To learn more about what you can do with MyChart, go to [email or web address].    Your next appointment:   6 month(s)  Provider:   Charlton Haws, MD    Other Instructions Thank you for choosing [facility]! \

## 2022-11-10 ENCOUNTER — Other Ambulatory Visit (HOSPITAL_COMMUNITY): Payer: Self-pay

## 2022-11-10 ENCOUNTER — Telehealth: Payer: Self-pay | Admitting: Cardiovascular Disease

## 2022-11-10 ENCOUNTER — Telehealth: Payer: Self-pay

## 2022-11-10 NOTE — Telephone Encounter (Signed)
Pt c/o medication issue:  1. Name of Medication: Evolocumab (REPATHA SURECLICK) 140 MG/ML SOAJ   2. How are you currently taking this medication (dosage and times per day)? INJECT 140 MG INTO THE SKIN EVERY 14 DAYS   3. Are you having a reaction (difficulty breathing--STAT)? No   4. What is your medication issue? Patient called and said that they need a pre-authorization for the patient to get medication

## 2022-11-10 NOTE — Telephone Encounter (Signed)
        PER TEST CLAIM: AUTHORIZATION IS ON FILE UNTIL 06/20/2098. Pt shouldn't have issues at the pharmacy picking up

## 2022-11-11 MED ORDER — REPATHA SURECLICK 140 MG/ML ~~LOC~~ SOAJ: 140.0000 mg | SUBCUTANEOUS | 11 refills | Status: DC

## 2022-11-11 NOTE — Telephone Encounter (Signed)
Left message for pt that no PA needed, looks like she may need new rx though, this has been sent in.

## 2022-11-16 ENCOUNTER — Telehealth: Payer: Self-pay | Admitting: Cardiovascular Disease

## 2022-11-16 NOTE — Telephone Encounter (Signed)
Pt c/o medication issue:  1. Name of Medication: losartan (COZAAR) 100 MG tablet   2. How are you currently taking this medication (dosage and times per day)?   3. Are you having a reaction (difficulty breathing--STAT)? Yes  4. What is your medication issue? Patient's daughter is calling to update office on this medications discontinued use. She states that another doctor started patient on spironolactone instead of this medication. She also had medication changed with Dr. Eden Emms and seems to be having issues jitters and uneasiness.

## 2022-11-17 NOTE — Telephone Encounter (Signed)
I spoke with daughter. Ever since she stopped metoprolol and started losartan, she has profound fatigue and feelings of uneasiness.She took herself to [facility] ED last night.  Daughter says Dr.Hasanaj stopped her losartan and started her on Aldactone 25 mg qd which daughter failed to mention at time of office visit.  Daughter understands that BB was changed to Diltiazem after vasospasm during heart cath but wonders if her mother going back on BB may alleviate  her continued symptoms. She said he mother is quite anxious and calls her frequently.

## 2022-11-18 MED ORDER — METOPROLOL SUCCINATE ER 25 MG PO TB24: 25.0000 mg | ORAL_TABLET | Freq: Every day | ORAL | 1 refills | Status: DC

## 2022-11-18 NOTE — Telephone Encounter (Signed)
I notified daughter I am awaiting response from Dr.Nishan.

## 2022-11-18 NOTE — Telephone Encounter (Signed)
I spoke with daughter and she wants her mother to stop diltiazem completely. She states she has not given it to her for the last 2 days. Now her BP yesterday was 145/110 and she had a headache. I encouraged her to call Dr.Hasanaj's office,she agrees to do so. She does agree to give her mother Toprol 25 mg qd    Please advise regarding stopping diltiazem.

## 2022-12-30 ENCOUNTER — Ambulatory Visit (HOSPITAL_COMMUNITY)
Admission: RE | Admit: 2022-12-30 | Discharge: 2022-12-30 | Disposition: A | Discharge status: Home / Self Care | Payer: Medicaid Other | Source: Ambulatory Visit | Attending: Cardiovascular Disease | Admitting: Cardiovascular Disease

## 2022-12-30 DIAGNOSIS — R0609 Other forms of dyspnea: Secondary | ICD-10-CM | POA: Insufficient documentation

## 2022-12-30 LAB — PULMONARY FUNCTION TEST
DL/VA % pred: 91 %
DL/VA: 3.7 ml/min/mmHg/L
DLCO unc % pred: 83 %
DLCO unc: 20.24 ml/min/mmHg
FEF 25-75 Post: 2.92 L/sec
FEF 25-75 Pre: 2.65 L/sec
FEF2575-%Change-Post: 10 %
FEF2575-%Pred-Post: 110 %
FEF2575-%Pred-Pre: 100 %
FEV1-%Change-Post: 3 %
FEV1-%Pred-Post: 92 %
FEV1-%Pred-Pre: 89 %
FEV1-Post: 2.85 L
FEV1-Pre: 2.75 L
FEV1FVC-%Change-Post: 2 %
FEV1FVC-%Pred-Pre: 100 %
FEV6-%Change-Post: 2 %
FEV6-%Pred-Post: 92 %
FEV6-%Pred-Pre: 90 %
FEV6-Post: 3.56 L
FEV6-Pre: 3.47 L
FEV6FVC-%Change-Post: 0 %
FEV6FVC-%Pred-Post: 103 %
FEV6FVC-%Pred-Pre: 103 %
FVC-%Change-Post: 1 %
FVC-%Pred-Post: 89 %
FVC-%Pred-Pre: 88 %
FVC-Post: 3.56 L
FVC-Pre: 3.51 L
Post FEV1/FVC ratio: 80 %
Post FEV6/FVC ratio: 100 %
Pre FEV1/FVC ratio: 78 %
Pre FEV6/FVC Ratio: 100 %
RV % pred: 111 %
RV: 2.55 L
TLC % pred: 104 %
TLC: 6.13 L

## 2022-12-30 MED ORDER — ALBUTEROL SULFATE (2.5 MG/3ML) 0.083% IN NEBU
2.5000 mg | INHALATION_SOLUTION | Freq: Once | RESPIRATORY_TRACT | Status: AC
  Administered 2022-12-30: 2.5 mg via RESPIRATORY_TRACT
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
## 2023-06-24 ENCOUNTER — Ambulatory Visit: Payer: Medicaid Other | Attending: Cardiovascular Disease | Admitting: Cardiovascular Disease

## 2023-06-24 ENCOUNTER — Encounter: Payer: Self-pay | Admitting: Cardiovascular Disease

## 2023-06-24 VITALS — BP 126/82 | HR 64 | Ht 69.5 in | Wt 261.0 lb

## 2023-06-24 DIAGNOSIS — E782 Mixed hyperlipidemia: Secondary | ICD-10-CM

## 2023-06-24 DIAGNOSIS — I10 Essential (primary) hypertension: Secondary | ICD-10-CM

## 2023-06-24 DIAGNOSIS — I2510 Atherosclerotic heart disease of native coronary artery without angina pectoris: Secondary | ICD-10-CM | POA: Diagnosis not present

## 2023-06-24 NOTE — Patient Instructions (Signed)
 Medication Instructions:  Your physician recommends that you continue on your current medications as directed. Please refer to the Current Medication list given to you today.  *If you need a refill on your cardiac medications before your next appointment, please call your pharmacy*   Lab Work: NONE   If you have labs (blood work) drawn today and your tests are completely normal, you will receive your results only by: MyChart Message (if you have MyChart) OR A paper copy in the mail If you have any lab test that is abnormal or we need to change your treatment, we will call you to review the results.   Testing/Procedures: NONE    Follow-Up: At [facility], you and your health needs are our priority.  As part of our continuing mission to provide you with exceptional heart care, we have created designated Provider Care Teams.  These Care Teams include your primary Cardiologist (physician) and Advanced Practice Providers (APPs -  Physician Assistants and Nurse Practitioners) who all work together to provide you with the care you need, when you need it.  We recommend signing up for the patient portal called "MyChart".  Sign up information is provided on this After Visit Summary.  MyChart is used to connect with patients for Virtual Visits (Telemedicine).  Patients are able to view lab/test results, encounter notes, upcoming appointments, etc.  Non-urgent messages can be sent to your provider as well.   To learn more about what you can do with MyChart, go to [email or web address].    Your next appointment:   1 year(s)  Provider:   You may see Charlton Haws, MD or one of the following Advanced Practice Providers on your designated Care Team:   Randall An, PA-C  Jacolyn Reedy, PA-C     Other Instructions Thank you for choosing [facility]!

## 2023-06-24 NOTE — Addendum Note (Signed)
 Addended by: Kerney Elbe on: 06/24/2023 03:41 PM   Modules accepted: Orders

## 2023-11-03 ENCOUNTER — Encounter: Payer: Self-pay | Admitting: Physician Assistant

## 2023-11-13 ENCOUNTER — Other Ambulatory Visit: Payer: Self-pay | Admitting: Cardiovascular Disease

## 2023-11-13 DIAGNOSIS — E782 Mixed hyperlipidemia: Secondary | ICD-10-CM

## 2023-11-13 DIAGNOSIS — I2510 Atherosclerotic heart disease of native coronary artery without angina pectoris: Secondary | ICD-10-CM

## 2023-12-28 ENCOUNTER — Telehealth: Payer: Self-pay | Admitting: Pharmacy Technician

## 2023-12-28 NOTE — Telephone Encounter (Signed)
 Pharmacy Patient Advocate Encounter   Received notification from CoverMyMeds that prior authorization for repatha  is required/requested.   Insurance verification completed.   The patient is insured through [employer] .   Per test claim: PA required; PA submitted to above mentioned insurance via latent Key/confirmation #/EOC [id number] Status is pending

## 2023-12-29 NOTE — Telephone Encounter (Signed)
 Pharmacy Patient Advocate Encounter  Received notification from [facility] that Prior Authorization for repatha  has been APPROVED from 12/29/23 to 12/28/24. Spoke to pharmacy to process.Copay is $4.00.    PA #/Case ID/Reference #: [id number]

## 2024-02-03 ENCOUNTER — Ambulatory Visit: Admitting: Physician Assistant

## 2024-02-03 ENCOUNTER — Ambulatory Visit
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
## 2024-03-16 ENCOUNTER — Ambulatory Visit (INDEPENDENT_AMBULATORY_CARE_PROVIDER_SITE_OTHER): Payer: MEDICAID | Admitting: Physician Assistant

## 2024-03-16 ENCOUNTER — Ambulatory Visit: Payer: MEDICAID

## 2024-03-16 ENCOUNTER — Encounter: Payer: Self-pay | Admitting: Physician Assistant

## 2024-03-16 VITALS — BP 112/74 | HR 60 | Resp 20 | Ht 69.5 in | Wt 225.0 lb

## 2024-03-16 DIAGNOSIS — R413 Other amnesia: Secondary | ICD-10-CM | POA: Diagnosis not present

## 2024-03-16 LAB — VITAMIN B12: Vitamin B-12: 589 pg/mL (ref 200–1100)

## 2024-03-16 NOTE — Patient Instructions (Signed)
 Neuropsych evaluation MRI brain  B12 today

## 2024-03-18 ENCOUNTER — Ambulatory Visit: Payer: Self-pay | Admitting: Physician Assistant

## 2024-03-19 NOTE — Progress Notes (Signed)
 Mail box is full, call back 03/19/2024 at 10:59am

## 2024-03-19 NOTE — Progress Notes (Signed)
 I advised to patient of b12 levels, voiced understanding and thanked me for calling.

## 2024-03-26 ENCOUNTER — Encounter: Payer: Self-pay | Admitting: Physician Assistant

## 2024-04-02 ENCOUNTER — Ambulatory Visit
Admission: RE | Admit: 2024-04-02 | Discharge: 2024-04-02 | Disposition: A | Discharge status: Home / Self Care | Payer: MEDICAID | Source: Ambulatory Visit | Attending: Physician Assistant | Admitting: Physician Assistant

## 2024-04-02 MED ORDER — GADOPICLENOL 0.5 MMOL/ML IV SOLN
10.0000 mL | Freq: Once | INTRAVENOUS | Status: AC | PRN
  Administered 2024-04-02: 10 mL via INTRAVENOUS

## 2024-04-12 ENCOUNTER — Other Ambulatory Visit: Payer: Self-pay | Admitting: Cardiovascular Disease

## 2024-04-12 DIAGNOSIS — I2510 Atherosclerotic heart disease of native coronary artery without angina pectoris: Secondary | ICD-10-CM

## 2024-04-12 DIAGNOSIS — E782 Mixed hyperlipidemia: Secondary | ICD-10-CM

## 2024-05-14 ENCOUNTER — Encounter: Payer: Self-pay | Admitting: Psychology

## 2024-05-15 ENCOUNTER — Ambulatory Visit: Payer: MEDICAID | Admitting: Psychology

## 2024-05-15 ENCOUNTER — Encounter: Payer: Self-pay | Admitting: Psychology

## 2024-05-15 DIAGNOSIS — F411 Generalized anxiety disorder: Secondary | ICD-10-CM | POA: Diagnosis not present

## 2024-05-15 DIAGNOSIS — R4189 Other symptoms and signs involving cognitive functions and awareness: Secondary | ICD-10-CM

## 2024-05-15 DIAGNOSIS — F09 Unspecified mental disorder due to known physiological condition: Secondary | ICD-10-CM | POA: Diagnosis not present

## 2024-05-15 DIAGNOSIS — F330 Major depressive disorder, recurrent, mild: Secondary | ICD-10-CM | POA: Diagnosis not present

## 2024-05-15 NOTE — Progress Notes (Signed)
   Psychometrician Note   Cognitive testing was administered to Dickey JINNY Hamilton by Lonell Jude, B.S. (psychometrist) under the supervision of Dr. Zachary C. Merz, Ph.D., ABPP, licensed psychologist on 05/15/2024. Ms. Dino did not appear overtly distressed by the testing session per behavioral observation or responses across self-report questionnaires. Rest breaks were offered.   The battery of tests administered was selected by Dr. Zachary C. Merz, Ph.D., ABPP with consideration to Ms. Parsell's current level of functioning, the nature of her symptoms, emotional and behavioral responses during interview, level of literacy, observed level of motivation/effort, and the nature of the referral question. This battery was communicated to the psychometrist. Communication between Dr. Arthea KYM Maryland, Ph.D., ABPP and the psychometrist was ongoing throughout the evaluation and Dr. Arthea KYM Maryland, Ph.D., ABPP was immediately accessible at all times. Dr. Zachary C. Merz, Ph.D., ABPP provided supervision to the psychometrist on the date of this service to the extent necessary to assure the quality of all services provided.    WING GFELLER will return within approximately 1-2 weeks for an interactive feedback session with Dr. Maryland at which time her test performances, clinical impressions, and treatment recommendations will be reviewed in detail. Ms. Rutten understands she can contact our office should she require our assistance before this time.  A total of 125 minutes of billable time were spent face-to-face with Ms. Himmelberger by the psychometrist. This includes both test administration and scoring time. Billing for these services is reflected in the clinical report generated by Dr. Arthea KYM Maryland, Ph.D., ABPP  This note reflects time spent with the psychometrician and does not include test scores or any clinical interpretations made by Dr. Maryland. The full report will follow in a separate note.
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
## 2024-06-05 ENCOUNTER — Ambulatory Visit: Payer: MEDICAID | Admitting: Psychology

## 2024-06-05 DIAGNOSIS — F411 Generalized anxiety disorder: Secondary | ICD-10-CM

## 2024-06-05 DIAGNOSIS — F330 Major depressive disorder, recurrent, mild: Secondary | ICD-10-CM

## 2024-06-05 DIAGNOSIS — F09 Unspecified mental disorder due to known physiological condition: Secondary | ICD-10-CM

## 2024-06-05 NOTE — Progress Notes (Signed)
° °  Neuropsychology Feedback Session [name] [facility] [place] Department of Neurology  Reason for Referral:   Amber Fischer is a 63 y.o. right-handed Caucasian female referred by Camie Sevin, PA-C, to characterize her current cognitive functioning and assist with diagnostic clarity and treatment planning in the context of prominent psychiatric distress and concern for progressive cognitive decline.   Feedback:   Amber Fischer completed a comprehensive neuropsychological evaluation on 05/15/2024. Please refer to that encounter for the full report and recommendations. Briefly, results suggested performance variability surrounding processing speed and attention/concentration. No consistent impairments were exhibited across any cognitive domain. Relative to her previous evaluation in May 2021, no domains exhibited appreciable decline over time. Despite Amber Fischer and her daughter's report of progressive memory loss, objective memory testing was actually mildly improved across the current evaluation relative to past performances. Amber Fischer has demonstrated a quite lengthy history of, at times, severe psychiatric distress. She describes a cyclical pattern of significant depression, concerns for hypomanic behaviors, and a constellation of psychotic symptoms including delusional thoughts and both auditory and visual hallucinations. These can be textbook symptoms associated with several primary psychiatric conditions (e.g., major depressive disorder with psychotic features, bipolar disorder II, or a primary psychotic disorder). Medically, Amber Fischer has numerous variables which, when combined with significant psychiatric distress, could reasonably lead to her current clinical presentation. Medical variables would include untreated obstructive sleep apnea, semi-frequent migraine headaches, fibromyalgia/chronic pain, poor/variable medication adherence (e.g., taking Risperdal as subjectively needed rather  than as prescribed) and medication side effects (especially hydrocodone , Topamax/topiramate, and Toprol /metoprolol  succinate). Given her psychiatric history, medical history, current testing patterns, and her relatively young age, a primary psychiatric cause, exacerbated by various chronic medical ailments and medication side effects, continues to remain the most likely culprit for cognitive and functional dysfunction. Neurologically speaking, [name]'s relatively young age would make the presence of a neurodegenerative illness quite unlikely. Memory patterns are not consistent with expectations surrounding Alzheimer's disease.  Amber Fischer was accompanied by her daughter during the current telephone call. They were within her residence while I was within my office. I discussed the limitations of evaluation and management by telemedicine and the availability of in person appointments. Amber Fischer expressed her understanding and agreed to proceed. Content of the current session focused on the results of her neuropsychological evaluation. Amber Fischer was given the opportunity to ask questions and her questions were answered. She was encouraged to reach out should additional questions arise. A copy of her report was mailed at the conclusion of the visit.      One unit 96132 (31 minutes) was billed for Dr. Loralee time spent preparing for, conducting, and documenting the current feedback session with Amber Fischer.

## 2024-07-19 ENCOUNTER — Encounter (HOSPITAL_COMMUNITY): Payer: Self-pay

## 2024-07-19 ENCOUNTER — Emergency Department (HOSPITAL_COMMUNITY)
Admission: EM | Admit: 2024-07-19 | Discharge: 2024-07-20 | Disposition: A | Discharge status: Other Acute Inpatient Hospital | Payer: MEDICAID | Attending: Emergency Medicine | Admitting: Emergency Medicine

## 2024-07-19 ENCOUNTER — Other Ambulatory Visit: Payer: Self-pay

## 2024-07-19 DIAGNOSIS — Z7982 Long term (current) use of aspirin: Secondary | ICD-10-CM | POA: Diagnosis not present

## 2024-07-19 DIAGNOSIS — F29 Unspecified psychosis not due to a substance or known physiological condition: Secondary | ICD-10-CM | POA: Insufficient documentation

## 2024-07-19 DIAGNOSIS — F4310 Post-traumatic stress disorder, unspecified: Secondary | ICD-10-CM | POA: Insufficient documentation

## 2024-07-19 DIAGNOSIS — T438X2A Poisoning by other psychotropic drugs, intentional self-harm, initial encounter: Secondary | ICD-10-CM

## 2024-07-19 DIAGNOSIS — R45851 Suicidal ideations: Secondary | ICD-10-CM

## 2024-07-19 DIAGNOSIS — T391X2A Poisoning by 4-Aminophenol derivatives, intentional self-harm, initial encounter: Secondary | ICD-10-CM | POA: Diagnosis not present

## 2024-07-19 DIAGNOSIS — F4312 Post-traumatic stress disorder, chronic: Secondary | ICD-10-CM | POA: Diagnosis present

## 2024-07-19 DIAGNOSIS — T50902A Poisoning by unspecified drugs, medicaments and biological substances, intentional self-harm, initial encounter: Secondary | ICD-10-CM

## 2024-07-19 DIAGNOSIS — Z794 Long term (current) use of insulin: Secondary | ICD-10-CM | POA: Insufficient documentation

## 2024-07-19 DIAGNOSIS — X838XXA Intentional self-harm by other specified means, initial encounter: Secondary | ICD-10-CM | POA: Diagnosis not present

## 2024-07-19 LAB — URINALYSIS, ROUTINE W REFLEX MICROSCOPIC
Bilirubin Urine: NEGATIVE
Glucose, UA: 50 mg/dL — AB
Hgb urine dipstick: NEGATIVE
Ketones, ur: NEGATIVE mg/dL
Nitrite: NEGATIVE
Protein, ur: NEGATIVE mg/dL
Specific Gravity, Urine: 1.032 — ABNORMAL HIGH (ref 1.005–1.030)
pH: 5 (ref 5.0–8.0)

## 2024-07-19 LAB — CBC
HCT: 47.4 % — ABNORMAL HIGH (ref 36.0–46.0)
Hemoglobin: 15.2 g/dL — ABNORMAL HIGH (ref 12.0–15.0)
MCH: 31.5 pg (ref 26.0–34.0)
MCHC: 32.1 g/dL (ref 30.0–36.0)
MCV: 98.1 fL (ref 80.0–100.0)
Platelets: 172 10*3/uL (ref 150–400)
RBC: 4.83 MIL/uL (ref 3.87–5.11)
RDW: 13 % (ref 11.5–15.5)
WBC: 9.9 10*3/uL (ref 4.0–10.5)
nRBC: 0 % (ref 0.0–0.2)

## 2024-07-19 LAB — COMPREHENSIVE METABOLIC PANEL WITH GFR
ALT: 29 U/L (ref 0–44)
AST: 34 U/L (ref 15–41)
Albumin: 4.3 g/dL (ref 3.5–5.0)
Alkaline Phosphatase: 92 U/L (ref 38–126)
Anion gap: 12 (ref 5–15)
BUN: 27 mg/dL — ABNORMAL HIGH (ref 8–23)
CO2: 23 mmol/L (ref 22–32)
Calcium: 9.7 mg/dL (ref 8.9–10.3)
Chloride: 105 mmol/L (ref 98–111)
Creatinine, Ser: 1.1 mg/dL — ABNORMAL HIGH (ref 0.44–1.00)
GFR, Estimated: 57 mL/min — ABNORMAL LOW
Glucose, Bld: 149 mg/dL — ABNORMAL HIGH (ref 70–99)
Potassium: 5 mmol/L (ref 3.5–5.1)
Sodium: 140 mmol/L (ref 135–145)
Total Bilirubin: 0.6 mg/dL (ref 0.0–1.2)
Total Protein: 7.6 g/dL (ref 6.5–8.1)

## 2024-07-19 LAB — URINE DRUG SCREEN
Amphetamines: NEGATIVE
Barbiturates: NEGATIVE
Benzodiazepines: NEGATIVE
Cocaine: NEGATIVE
Fentanyl: NEGATIVE
Methadone Scn, Ur: NEGATIVE
Opiates: POSITIVE — AB
Tetrahydrocannabinol: NEGATIVE

## 2024-07-19 LAB — CBG MONITORING, ED
Glucose-Capillary: 99 mg/dL (ref 70–99)
Glucose-Capillary: 128 mg/dL — ABNORMAL HIGH (ref 70–99)
Glucose-Capillary: 117 mg/dL — ABNORMAL HIGH (ref 70–99)
Glucose-Capillary: 104 mg/dL — ABNORMAL HIGH (ref 70–99)

## 2024-07-19 LAB — SALICYLATE LEVEL: Salicylate Lvl: <7 mg/dL — ABNORMAL LOW (ref 7.0–30.0)

## 2024-07-19 LAB — ACETAMINOPHEN LEVEL: Acetaminophen (Tylenol), Serum: 13 ug/mL (ref 10–30)

## 2024-07-19 LAB — ETHANOL: Alcohol, Ethyl (B): <15 mg/dL

## 2024-07-19 LAB — HEMOGLOBIN A1C
Hgb A1c MFr Bld: 5.7 % — ABNORMAL HIGH (ref 4.8–5.6)
Mean Plasma Glucose: 116.89 mg/dL

## 2024-07-19 MED ORDER — SODIUM CHLORIDE 0.9 % IV BOLUS
1000.0000 mL | Freq: Once | INTRAVENOUS | Status: AC
  Administered 2024-07-19: 1000 mL via INTRAVENOUS

## 2024-07-19 MED ORDER — ALBUTEROL SULFATE HFA 108 (90 BASE) MCG/ACT IN AERS: 2.0000 | INHALATION_SPRAY | Freq: Four times a day (QID) | RESPIRATORY_TRACT | Status: DC | PRN

## 2024-07-19 MED ORDER — INSULIN ASPART 100 UNIT/ML IJ SOLN
0.0000 [IU] | Freq: Three times a day (TID) | INTRAMUSCULAR | Status: DC
  Filled 2024-07-19: qty 2

## 2024-07-19 MED ORDER — OMEGA-3-ACID ETHYL ESTERS 1 G PO CAPS
1.0000 g | ORAL_CAPSULE | Freq: Every day | ORAL | Status: DC
  Administered 2024-07-19 – 2024-07-20 (×2): 1 g via ORAL
  Filled 2024-07-19 (×2): qty 1

## 2024-07-19 MED ORDER — LORATADINE 10 MG PO TABS
10.0000 mg | ORAL_TABLET | Freq: Every day | ORAL | Status: DC
  Administered 2024-07-19 – 2024-07-20 (×2): 10 mg via ORAL
  Filled 2024-07-19 (×2): qty 1

## 2024-07-19 MED ORDER — METOPROLOL SUCCINATE ER 25 MG PO TB24
25.0000 mg | ORAL_TABLET | Freq: Every day | ORAL | Status: DC
  Administered 2024-07-19 – 2024-07-20 (×2): 25 mg via ORAL
  Filled 2024-07-19 (×2): qty 1

## 2024-07-19 MED ORDER — ALUM & MAG HYDROXIDE-SIMETH 200-200-20 MG/5ML PO SUSP: 30.0000 mL | Freq: Four times a day (QID) | ORAL | Status: DC | PRN

## 2024-07-19 MED ORDER — HALOPERIDOL 5 MG PO TABS: 5.0000 mg | ORAL_TABLET | Freq: Every day | ORAL | Status: DC

## 2024-07-19 MED ORDER — ASPIRIN 81 MG PO TBEC
81.0000 mg | DELAYED_RELEASE_TABLET | Freq: Every day | ORAL | Status: DC
  Administered 2024-07-19 – 2024-07-20 (×2): 81 mg via ORAL
  Filled 2024-07-19 (×2): qty 1

## 2024-07-19 MED ORDER — METFORMIN HCL 500 MG PO TABS
500.0000 mg | ORAL_TABLET | Freq: Two times a day (BID) | ORAL | Status: DC
  Administered 2024-07-20: 500 mg via ORAL
  Filled 2024-07-19 (×3): qty 1

## 2024-07-19 MED ORDER — HALOPERIDOL 5 MG PO TABS
5.0000 mg | ORAL_TABLET | Freq: Once | ORAL | Status: AC
  Administered 2024-07-19: 5 mg via ORAL
  Filled 2024-07-19: qty 1

## 2024-07-19 MED ORDER — PANTOPRAZOLE SODIUM 40 MG PO TBEC
40.0000 mg | DELAYED_RELEASE_TABLET | Freq: Every day | ORAL | Status: DC
  Administered 2024-07-19 – 2024-07-20 (×2): 40 mg via ORAL
  Filled 2024-07-19 (×2): qty 1

## 2024-07-19 MED ORDER — VITAMIN B-12 1000 MCG PO TABS
1000.0000 ug | ORAL_TABLET | Freq: Every day | ORAL | Status: DC
  Administered 2024-07-19 – 2024-07-20 (×2): 1000 ug via ORAL
  Filled 2024-07-19 (×2): qty 1

## 2024-07-19 MED ORDER — LEVOTHYROXINE SODIUM 75 MCG PO TABS
150.0000 ug | ORAL_TABLET | Freq: Every day | ORAL | Status: DC
  Administered 2024-07-19 – 2024-07-20 (×2): 150 ug via ORAL
  Filled 2024-07-19 (×2): qty 2

## 2024-07-19 MED ORDER — ONDANSETRON HCL 4 MG/2ML IJ SOLN
4.0000 mg | Freq: Once | INTRAMUSCULAR | Status: AC
  Administered 2024-07-19: 4 mg via INTRAVENOUS
  Filled 2024-07-19: qty 2

## 2024-07-19 NOTE — ED Notes (Signed)
 Handoff to [employer] at [facility]

## 2024-07-19 NOTE — ED Notes (Signed)
Pt currently denying SI/HI

## 2024-07-19 NOTE — Progress Notes (Addendum)
 LCSW Progress Note  Amber Fischer   MRN: [id number]   Received a call from Intake Coordinator Thedore Haddock regarding the patient, who has been accepted for admission. Nurse report can be obtained at [phone]. The patient is scheduled to arrive tomorrow, 07/20/2024, after 8:00?AM, at [facility], located at [address], [place], [place] [address]. The attending psychiatrist for this admission will be Dr. Millie Manners.  Patient's care team Thersia NOVAK, RN and Franky Gaul, MD notified of patient's disposition.

## 2024-07-19 NOTE — ED Notes (Signed)
 Pt dressed out, clothes placed in belongings bag and stored in [place] [address] Rest of belongings given to patients daughters at bedside

## 2024-07-19 NOTE — BH Assessment (Addendum)
 Per RN, pt not medically cleared, waiting on UA. Later received message pt was medically cleared. Pt will be seen by dayshift provider.

## 2024-07-19 NOTE — ED Notes (Signed)
 Pt awake, daughters at bedside.

## 2024-07-19 NOTE — Progress Notes (Signed)
 Psychiatric Nurse Liaison (PNL) Rounding Note   Patient Mood/Affect: Irritable/agitated  Noted Patient Behaviors: At this time, patient endorsed auditory, visual, and tactile hallucinations. Patient stated that there were people currently in the ED, in her home, in her daughter's home, and everywhere she goes. She reports that they get angry with her and eat the food in her kitchen. Reports that they were currently underneath her and playing around up there. Patient tearful. Endorses fear. Patient became irritable with her daughter and stated that she wished her daughter would have left her in her house. When told that she was brought into the ED because there was fear she would die, patient states, And it's my body. I get to decide what I do with it. Patient reports being fearful of staff wanting to hurt her. They play images from here on screens on my walls. States that the people told her that patients are brought into the hospital to be harmed.   Interventions Initiated by Psychiatric Nurse Liaison: Therapeutic communication and emotional support. Patient reassured of her safety. Primary RN administered PO haldol .  Recommendations for Patient Care: PRN medications as needed. Recommended that family return home to rest. Continue to reassure patient of safety.  Patients Response to Treatment: Unchanged  Time Spent with Patient:   30 mins

## 2024-07-19 NOTE — ED Notes (Addendum)
 Poison Control Contacted. Spoke with Franky, Rph  Recommendations: treating with antiemetics, or narcan if indicated.   Given time of ingestion PC suggests supportive care. Acetaminophen  level not elevated enough for acetylcysteine.   Monitor until back to baseline.

## 2024-07-19 NOTE — ED Notes (Signed)
 Findings Envelope Number:  [id number]; copies being made and distributed

## 2024-07-19 NOTE — ED Triage Notes (Signed)
 Arrives POV with daughters who are primary caregivers.   They report cognitive delay and hallucinations with voices making fun of her. Previously prescribed Risperdal but noncompliant.   Family report 30 missing Hydrocodones 7.5mg  missing and consumed before noon yesterday.   Pt has pinpoint pupils, and vomiting on arrival.

## 2024-07-19 NOTE — ED Provider Notes (Signed)
 Psychiatry has evaluated patient and will start psychiatric medications and she will need inpatient psychiatric admission.    Freddi Hamilton, MD 07/19/24 [phone]

## 2024-07-19 NOTE — ED Notes (Signed)
 RN attempted to call report to [facility]. NO answer but left a voicemail with RN number to call back

## 2024-07-19 NOTE — ED Notes (Signed)
 Patients family is at bedside.They told me patient will not speak to TTS alone and if the daughter cannot speak to TTS with the mother then we can just let them go home. I advides family that patient is IVCd and they can't take patient home or rescind it. Daughter said well I told them to IVC so why can't I. I explained policy and this was received well per family.  They are upset they can't speak to psychiatry with the patient. I advised that because there is no power of attorney present that is not how our protocol works. They did not like this answer and asked to speak with the doctor. Freddi made aware and is going to speak with them.

## 2024-07-19 NOTE — ED Notes (Signed)
 IVC paperwork given by Pollina MD; in progress

## 2024-07-19 NOTE — ED Notes (Signed)
 Envelope Number:  [id number]; Case: [id number]; waiting for findings

## 2024-07-19 NOTE — ED Notes (Signed)
 Pt denies SI/HI at this time.

## 2024-07-19 NOTE — Consult Note (Addendum)
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     stent 5 years ago   ESOPHAGEAL DILATION N/A 08/14/2014   Procedure: ESOPHAGEAL DILATION;  Surgeon: Claudis PENNER Golda, MD;  [facility]: [facility];  Service: Endoscopy;  Laterality: N/A;   ESOPHAGEAL DILATION N/A 01/20/2018   Procedure: ESOPHAGEAL DILATION;  Surgeon: Golda Claudis PENNER, MD;  [facility]: [facility];  Service: Endoscopy;  Laterality: N/A;   ESOPHAGOGASTRODUODENOSCOPY  03/22/2012   Procedure: ESOPHAGOGASTRODUODENOSCOPY (EGD);  Surgeon: Claudis PENNER Golda, MD;  [facility];  Service: Endoscopy;  Laterality: N/A;  300   ESOPHAGOGASTRODUODENOSCOPY N/A 08/14/2014   Procedure: ESOPHAGOGASTRODUODENOSCOPY (EGD);  Surgeon: Claudis PENNER Golda, MD;  [facility]: [facility];  Service: Endoscopy;  Laterality: N/A;  225   ESOPHAGOGASTRODUODENOSCOPY (EGD) WITH PROPOFOL  N/A 01/20/2018   Procedure: ESOPHAGOGASTRODUODENOSCOPY (EGD) WITH PROPOFOL ;  Surgeon: Golda Claudis PENNER, MD;  [facility]: [facility];  Service: Endoscopy;  Laterality: N/A;   LEFT HEART CATH AND CORONARY ANGIOGRAPHY N/A 01/04/2019   Procedure:  LEFT HEART CATH AND CORONARY ANGIOGRAPHY;  Surgeon: Dann Candyce RAMAN, MD;  Location: [facility] INVASIVE CV LAB;  Service: Cardiovascular;  Laterality: N/A;   LEFT HEART CATH AND CORONARY ANGIOGRAPHY N/A 06/24/2022   Procedure: LEFT HEART CATH AND CORONARY ANGIOGRAPHY;  Surgeon: Claudene Victory ORN, MD;  Location: [facility] INVAS[facility] CV LAB;  Service: Cardiovascular;  Laterality: N/A;     Medications:  Current Medications[1]  Allergies: Allergies[2]  Jerel JINNY Gravely, NP     [1]  Current Facility-Administered Medications:    albuterol  (VENTOLIN  HFA) 108 (90 Base) MCG/ACT inhaler 2 puff, 2 puff, Inhalation, Q6H PRN, Pollina, Lonni JINNY, MD   alum & mag hydroxide-simeth (MAALOX/MYLANTA) 200-200-20 MG/5ML suspension 30 mL, 30 mL, Oral, Q6H PRN, Goldston, Borrero, MD   aspirin  EC tablet 81 mg, 81 mg, Oral, Daily, Pollina, Lonni JINNY, MD, 81 mg at 07/19/24 [phone]  cyanocobalamin  (VITAMIN B12) tablet 1,000 mcg, 1,000 mcg, Oral, Daily, Pollina, Lonni PARAS, MD, 1,000 mcg at 07/19/24 1116   insulin  aspart (novoLOG ) injection 0-15 Units, 0-15 Units, Subcutaneous, TID WC, Pollina, Lonni PARAS, MD   levothyroxine  (SYNTHROID ) tablet 150 mcg, 150 mcg, Oral, QAC breakfast, Pollina, Lonni PARAS, MD, 150 mcg at 07/19/24 9068   loratadine  (CLARITIN ) tablet 10 mg, 10 mg, Oral, Daily, Pollina, Lonni PARAS, MD, 10 mg at 07/19/24 0932   metFORMIN  (GLUCOPHAGE ) tablet 500 mg, 500 mg, Oral, BID, Pollina, Lonni PARAS, MD   metoprolol  succinate (TOPROL -XL) 24 hr tablet 25 mg, 25 mg, Oral, Daily, Pollina, Lonni PARAS, MD, 25 mg at 07/19/24 0932   omega-3 acid ethyl esters (LOVAZA ) capsule 1 g, 1 g, Oral, Daily, Pollina, Lonni PARAS, MD, 1 g at 07/19/24 0931   pantoprazole  (PROTONIX ) EC tablet 40 mg, 40 mg, Oral, Daily, Pollina, Lonni PARAS, MD, 40 mg at 07/19/24 0931   sodium chloride  flush (NS) 0.9 % injection 3 mL, 3 mL, Intravenous, Q12H, Charls Pearla LABOR, MD   sodium chloride  flush (NS) 0.9 % injection 3  mL, 3 mL, Intravenous, Q12H, Nishan, Peter C, MD  Current Outpatient Medications:    albuterol  (VENTOLIN  HFA) 108 (90 Base) MCG/ACT inhaler, Inhale 2 puffs into the lungs every 6 (six) hours as needed for wheezing or shortness of breath., Disp: , Rfl:    aspirin  EC 81 MG tablet, Take 81 mg by mouth daily. , Disp: , Rfl:    BIOTIN PO, Take 1 tablet by mouth daily., Disp: , Rfl:    CALCIUM PO, Take 1 tablet by mouth daily., Disp: , Rfl:    Cyanocobalamin  (VITAMIN B-12 PO), Take 1 tablet by mouth daily., Disp: , Rfl:    DEXILANT  60 MG capsule, TAKE ONE CAPSULE BY MOUTH BEFORE BREAKFAST (Patient taking differently: Take 60 mg by mouth daily.), Disp: 30 capsule, Rfl: 2   Evolocumab  (REPATHA  SURECLICK) 140 MG/ML SOAJ, INJECT 1 pen UNDER THE SKIN every 14 DAYS (Patient taking differently: Inject 140 mg into the skin See admin instructions. Inject 1mL (140mg ) into the skin every other Saturday.), Disp: 6 mL, Rfl: 3   HYDROcodone -acetaminophen  (NORCO) 7.5-325 MG tablet, Take 1 tablet by mouth 2 (two) times daily as needed for severe pain (pain score 7-10)., Disp: , Rfl:    ibuprofen (ADVIL) 200 MG tablet, Take 400 mg by mouth 2 (two) times daily as needed for headache (pain)., Disp: , Rfl:    insulin  aspart (NOVOLOG ) 100 UNIT/ML injection, Inject 3-6 Units into the skin See admin instructions. Inject 3-9 units into the skin per sliding scale three times daily as needed for blood sugar exceeding 150: < 150 : 0 units 150 - 200 : 3 units 201 - 250 : 6 units > 250 : 6 units and recheck, Disp: , Rfl:    levothyroxine  (SYNTHROID ) 150 MCG tablet, Take 150 mcg by mouth daily., Disp: , Rfl:    loratadine  (CLARITIN ) 10 MG tablet, Take 10 mg by mouth daily., Disp: , Rfl:    metFORMIN  (GLUCOPHAGE ) 500 MG tablet, Take 500 mg by mouth daily as needed (blood sugar > 140)., Disp: , Rfl:    metoprolol  succinate (TOPROL -XL) 100 MG 24 hr tablet, Take 100 mg by mouth daily., Disp: , Rfl:    Omega-3 Fatty Acids (FISH OIL PO),  Take 3 capsules by mouth daily., Disp: , Rfl:    OVER THE COUNTER MEDICATION, Take 1 tablet by mouth daily. OTC combination; C + zinc + D3, Disp: ,  Rfl:    spironolactone (ALDACTONE) 25 MG tablet, Take 25 mg by mouth daily., Disp: , Rfl:    tiotropium (SPIRIVA  HANDIHALER) 18 MCG inhalation capsule, Place 1 capsule (18 mcg total) into inhaler and inhale daily. (Patient taking differently: Place 18 mcg into inhaler and inhale daily as needed (wheezing, shortness of breath not resolved with Albuterol  HFA).), Disp: 30 capsule, Rfl: 6   topiramate (TOPAMAX) 50 MG tablet, Take 50 mg by mouth 2 (two) times daily as needed (migraine)., Disp: , Rfl:    metoprolol  tartrate (LOPRESSOR ) 50 MG tablet, Take 50 mg by mouth 2 (two) times daily. (Patient not taking: Reported on 07/19/2024), Disp: , Rfl:    risperiDONE (RISPERDAL) 0.25 MG tablet, Take 0.25 mg by mouth 2 (two) times daily. (Patient not taking: Reported on 07/19/2024), Disp: , Rfl:  [2]  Allergies Allergen Reactions   Cipro [Ciprofloxacin Hcl] Itching and Nausea And Vomiting   Risperdal [Risperidone] Other (See Comments)    Over-sedation   Sulfa Antibiotics Hives   Sulfonamide Derivatives Hives   Bee Venom Swelling and Rash   Pravastatin Swelling and Rash    Extremity swelling

## 2024-07-19 NOTE — ED Notes (Signed)
 Pts belongings given to daughters at bedside. Pt had teal shirt, blue pants and brown shoes. Purse was also taken by daughters.

## 2024-07-19 NOTE — ED Notes (Addendum)
 RN handoff from [employer]. Sitter arrived at bedside.

## 2024-07-19 NOTE — Progress Notes (Signed)
 Psychiatric Nurse Liaison (PNL) Rounding Note  Current SI/HI/AVH: Patient denies SI/HI/AVH. Observed responding to internal stimuli.  Patient Mood/Affect: Flat  Noted Patient Behaviors: Patient in hallway bed with daughters and sitter present at bedside. Per daughter, Rhoda, patient has been experiencing AVH for the past year and states that patient overdosed on at home medications to stop them. Daughter also states that patient prefers for her to speak with providers and staff. Reports that patient doesn't like speaking with staff because she thinks it'll make the hallucinations angry.  Interventions Initiated by Psychiatric Nurse Liaison:  Supportive listening and emotional support provided to patient and family. Education on IVC process and possible inpatient hospitalization given to family. Recommendations for Patient Care: Continue to provide emotional support. Recommended that both patient and family get rest due to being up all night.   Patients Response to Treatment: Unchanged  Time Spent with Patient:   20 mins

## 2024-07-19 NOTE — ED Provider Notes (Signed)
 " [place] [facility] Provider Note   CSN: [id number] Arrival date & time: 07/19/24  0125     Patient presents with: Ingestion   Amber Fischer is a 64 y.o. female.   Brought to the emergency department by family members for concern over worsening psychiatric status and an overdose.  Patient's daughter and caregiver monitors her at her home.  Daughter reports that she had not heard from her mother all day today, started checking the cameras that are in the house and identified that she seemed to be hallucinating and then took an overdose of medications right around noon today.  There was up to 30 Vicodin tablets missing from the bottle.  Daughter reports that she had not slept for a lot of the day before she discovered what was going on, did have some nausea and vomiting prior to coming to the ED.  Patient was able to tell her daughter that she was seeing faces in the wall laughing at her and that she could not take it anymore, took the overdose because of the hallucinations.       Prior to Admission medications  Medication Sig Start Date End Date Taking? Authorizing Provider  albuterol  (VENTOLIN  HFA) 108 (90 Base) MCG/ACT inhaler Inhale 2 puffs into the lungs every 6 (six) hours as needed for wheezing or shortness of breath.    [provider]  Ascorbic Acid (SUPER C COMPLEX PO) Take 1 tablet by mouth daily.    [provider]  aspirin  EC 81 MG tablet Take 81 mg by mouth daily.     [provider]  BIOTIN PO Take 1 tablet by mouth every morning.    [provider]  CALCIUM PO Take 600 mg by mouth daily.    [provider]  clobetasol (TEMOVATE) 0.05 % external solution Apply 1 Application topically daily as needed (Hair thinning). 06/07/22   [provider]  cyanocobalamin  (VITAMIN B12) 1000 MCG tablet Take 1,000 mcg by mouth daily.    [provider]  DEXILANT  60 MG capsule TAKE ONE CAPSULE  BY MOUTH BEFORE BREAKFAST 05/05/20   Woodard, Janice B, PA-C  Evolocumab  (REPATHA  SURECLICK) 140 MG/ML SOAJ INJECT 1 pen UNDER THE SKIN every 14 DAYS 04/12/24   Nishan, Peter C, MD  HYDROcodone -acetaminophen  (NORCO) 7.5-325 MG tablet Take 1 tablet by mouth 2 (two) times daily.    [provider]  insulin  aspart (NOVOLOG ) 100 UNIT/ML injection Inject 3-6 Units into the skin 3 (three) times daily before meals. Per Sliding Scale  150-200= 3 units 200-250= 6 units    [provider]  levothyroxine  (SYNTHROID ) 150 MCG tablet Take 150 mcg by mouth daily before breakfast.    [provider]  loratadine  (CLARITIN ) 10 MG tablet Take 10 mg by mouth daily.    [provider]  metFORMIN  (GLUCOPHAGE ) 500 MG tablet Take 500 mg by mouth 2 (two) times daily. 04/02/20   [provider]  metoprolol  succinate (TOPROL  XL) 25 MG 24 hr tablet Take 1 tablet (25 mg total) by mouth daily. 11/18/22   Nishan, Peter C, MD  nitroGLYCERIN  (NITROSTAT ) 0.4 MG SL tablet Place 1 tablet (0.4 mg total) under the tongue every 5 (five) minutes as needed for chest pain. 06/19/21   Nishan, Peter C, MD  nystatin  cream (MYCOSTATIN ) Apply 1 application topically 2 (two) times daily as needed (applied to affected area of buttocks).    [provider]  Omega-3 Fatty Acids (FISH  OIL) 1000 MG CAPS Take 3,000 mg by mouth daily.    [provider]  polyethylene glycol (MIRALAX  / GLYCOLAX ) 17 g packet Take 17 g by mouth daily as needed for moderate constipation or mild constipation.    [provider]  ranolazine (RANEXA) 500 MG 12 hr tablet Take 500 mg by mouth 2 (two) times daily.    [provider]  tiotropium (SPIRIVA  HANDIHALER) 18 MCG inhalation capsule Place 1 capsule (18 mcg total) into inhaler and inhale daily. 07/09/14   Charls Pearla LABOR, MD  topiramate (TOPAMAX) 100 MG tablet Take 100 mg by mouth 2 (two) times daily. Patient taking differently: Take 100 mg  by mouth 2 (two) times daily. 50mg  bid    [provider]    Allergies: Ciprofloxacin, Sulfonamide derivatives, Bee venom, and Pravastatin    Review of Systems  Updated Vital Signs BP 130/69 (BP Location: Right Arm)   Pulse (!) 49   Temp 97.7 F (36.5 C)   Resp 20   Ht 5' 9 (1.753 m)   Wt 99.8 kg   SpO2 97%   BMI 32.49 kg/m   Physical Exam Vitals and nursing note reviewed.  Constitutional:      General: She is not in acute distress.    Appearance: She is well-developed.  HENT:     Head: Normocephalic and atraumatic.     Mouth/Throat:     Mouth: Mucous membranes are moist.  Eyes:     General: Vision grossly intact. Gaze aligned appropriately.     Extraocular Movements: Extraocular movements intact.     Conjunctiva/sclera: Conjunctivae normal.  Cardiovascular:     Rate and Rhythm: Normal rate and regular rhythm.     Pulses: Normal pulses.     Heart sounds: Normal heart sounds, S1 normal and S2 normal. No murmur heard.    No friction rub. No gallop.  Pulmonary:     Effort: Pulmonary effort is normal. No respiratory distress.     Breath sounds: Normal breath sounds.  Abdominal:     General: Bowel sounds are normal.     Palpations: Abdomen is soft.     Tenderness: There is no abdominal tenderness. There is no guarding or rebound.     Hernia: No hernia is present.  Musculoskeletal:        General: No swelling.     Cervical back: Full passive range of motion without pain, normal range of motion and neck supple. No spinous process tenderness or muscular tenderness. Normal range of motion.     Right lower leg: No edema.     Left lower leg: No edema.  Skin:    General: Skin is warm and dry.     Capillary Refill: Capillary refill takes less than 2 seconds.     Findings: No ecchymosis, erythema, rash or wound.  Neurological:     General: No focal deficit present.     Mental Status: She is alert and oriented to person, place, and time.     GCS: GCS eye subscore  is 4. GCS verbal subscore is 5. GCS motor subscore is 6.     Cranial Nerves: Cranial nerves 2-12 are intact.     Sensory: Sensation is intact.     Motor: Motor function is intact.     Coordination: Coordination is intact.  Psychiatric:        Attention and Perception: She is inattentive.        Mood and Affect: Affect is inappropriate.  Speech: Speech is delayed.        Behavior: Behavior is cooperative.        Cognition and Memory: Cognition is impaired.     (all labs ordered are listed, but only abnormal results are displayed) Labs Reviewed  COMPREHENSIVE METABOLIC PANEL WITH GFR - Abnormal; Notable for the following components:      Result Value   Glucose, Bld 149 (*)    BUN 27 (*)    Creatinine, Ser 1.10 (*)    GFR, Estimated 57 (*)    All other components within normal limits  CBC - Abnormal; Notable for the following components:   Hemoglobin 15.2 (*)    HCT 47.4 (*)    All other components within normal limits  SALICYLATE LEVEL - Abnormal; Notable for the following components:   Salicylate Lvl <7.0 (*)    All other components within normal limits  ETHANOL  ACETAMINOPHEN  LEVEL  URINE DRUG SCREEN  URINALYSIS, ROUTINE W REFLEX MICROSCOPIC  HEMOGLOBIN A1C    EKG: EKG Interpretation Date/Time:  Thursday July 19 2024 01:42:44 EST Ventricular Rate:  49 PR Interval:  166 QRS Duration:  82 QT Interval:  438 QTC Calculation: 395 R Axis:   55  Text Interpretation: Sinus bradycardia Increased R/S ratio in V1, consider early transition or posterior infarct Abnormal ECG When compared with ECG of 04-Jan-2019 06:08, No acute changes Confirmed by Haze Lonni PARAS [phone]) on 07/19/2024 2:55:08 AM  Radiology: No results found.   Procedures   Medications Ordered in the ED  insulin  aspart (novoLOG ) injection 0-15 Units (has no administration in time range)  albuterol  (VENTOLIN  HFA) 108 (90 Base) MCG/ACT inhaler 2 puff (has no administration in time range)   aspirin  EC tablet 81 mg (has no administration in time range)  cyanocobalamin  (VITAMIN B12) tablet 1,000 mcg (has no administration in time range)  pantoprazole  (PROTONIX ) EC tablet 40 mg (has no administration in time range)  levothyroxine  (SYNTHROID ) tablet 150 mcg (has no administration in time range)  loratadine  (CLARITIN ) tablet 10 mg (has no administration in time range)  metFORMIN  (GLUCOPHAGE ) tablet 500 mg (has no administration in time range)  metoprolol  succinate (TOPROL -XL) 24 hr tablet 25 mg (has no administration in time range)  omega-3 acid ethyl esters (LOVAZA ) capsule 1 g (has no administration in time range)  sodium chloride  0.9 % bolus 1,000 mL (0 mLs Intravenous Stopped 07/19/24 0320)  ondansetron  (ZOFRAN ) injection 4 mg (4 mg Intravenous Given 07/19/24 0223)                                    Medical Decision Making Amount and/or Complexity of Data Reviewed Labs: ordered.   Differential diagnosis considered includes, but not limited to: TIA; Stroke; ICH; Seizure; electrolyte abnormality; hypoglycemia; toxic/pharmacologic causes; CNS infection; psychiatric disorder  Brought to the emergency department after family noticed that she was acting differently.  They had not heard from her all day and then checked on her.  Family has cameras in her house that they can access, looked back in time and saw that she took an overdose of some medication and then slept the rest of the day.  When he looked at her medications about half a bottle of hydrocodone  was missing.  Here in the ED, patient is awake and alert, pleasant and cooperative.  She will not answer questions, prefers that her daughters do her talking for her.  Poison control  has reviewed the case and felt that no direct interventions were necessary, supportive care.  As she is awake and alert currently, can be medically clear for psychiatric evaluation.  Family is concerned about auditory and visual hallucinations have  been ongoing for some time.  She was evaluated by neurology, including having an MRI, and family was told that it was likely psychiatric in nature.  Patient did tell her daughter today that she was seeing faces in the wall that were laughing at her, this caused her to take the overdose because she could not deal with it anymore.  She will require psychiatric evaluation for consideration for treatment.     Final diagnoses:  Intentional overdose, initial encounter (HCC)  Suicidal ideation  Psychosis, unspecified psychosis type [facility])    ED Discharge Orders     None          Haze Lonni PARAS, MD 07/19/24 [phone]  "

## 2024-07-20 LAB — CBG MONITORING, ED
Glucose-Capillary: 111 mg/dL — ABNORMAL HIGH (ref 70–99)
Glucose-Capillary: 90 mg/dL (ref 70–99)

## 2024-07-20 NOTE — ED Provider Notes (Signed)
 Patient has been accepted to [facility].  Dr. Tena Fuchs.  Law enforcement coming to take her as she is under IVC.   Towana Ozell BROCKS, MD 07/20/24 [phone]

## 2024-07-20 NOTE — Progress Notes (Signed)
 Psychiatric Nurse Liaison (PNL) Rounding Note  Current SI/HI/AVH: Denies SI/HI/AVH  Patient Mood/Affect: Patient pleasant and calm.  Noted Patient Behaviors: Patient presents with an animated expressions and reports feeling better. States, I just needed to be snapped back.  Interventions Initiated by Psychiatric Nurse Liaison: Emotional support and therapeutic communication  Recommendations for Patient Care: Patient set to discharge to [facility]  Patients Response to Treatment: Effective  Time Spent with Patient:   5 mins

## 2024-07-20 NOTE — ED Notes (Signed)
 Transported to [employer] by [employer].

## 2024-07-20 NOTE — ED Notes (Signed)
 Sheriff called back, transport ETA 1000

## 2024-07-20 NOTE — ED Notes (Signed)
 Called, message left with sheriff for IVC transport.
